# Patient Record
Sex: Female | Born: 1940 | Race: White | Hispanic: No | Smoking: Current every day smoker
Health system: Southern US, Community
[De-identification: ages and names within clinical notes are randomized; demographics above are authoritative.]

## PROBLEM LIST (undated history)

## (undated) DIAGNOSIS — K259 Gastric ulcer, unspecified as acute or chronic, without hemorrhage or perforation: Secondary | ICD-10-CM

## (undated) DIAGNOSIS — Z9289 Personal history of other medical treatment: Secondary | ICD-10-CM

## (undated) DIAGNOSIS — M1990 Unspecified osteoarthritis, unspecified site: Secondary | ICD-10-CM

## (undated) DIAGNOSIS — J45909 Unspecified asthma, uncomplicated: Secondary | ICD-10-CM

## (undated) DIAGNOSIS — Z8719 Personal history of other diseases of the digestive system: Secondary | ICD-10-CM

## (undated) DIAGNOSIS — I509 Heart failure, unspecified: Secondary | ICD-10-CM

## (undated) DIAGNOSIS — E119 Type 2 diabetes mellitus without complications: Secondary | ICD-10-CM

## (undated) DIAGNOSIS — F419 Anxiety disorder, unspecified: Secondary | ICD-10-CM

## (undated) DIAGNOSIS — I2699 Other pulmonary embolism without acute cor pulmonale: Secondary | ICD-10-CM

## (undated) DIAGNOSIS — C4331 Malignant melanoma of nose: Secondary | ICD-10-CM

## (undated) DIAGNOSIS — J449 Chronic obstructive pulmonary disease, unspecified: Secondary | ICD-10-CM

## (undated) DIAGNOSIS — Z9981 Dependence on supplemental oxygen: Secondary | ICD-10-CM

## (undated) DIAGNOSIS — G40409 Other generalized epilepsy and epileptic syndromes, not intractable, without status epilepticus: Secondary | ICD-10-CM

## (undated) DIAGNOSIS — M797 Fibromyalgia: Secondary | ICD-10-CM

## (undated) DIAGNOSIS — J42 Unspecified chronic bronchitis: Secondary | ICD-10-CM

## (undated) DIAGNOSIS — K219 Gastro-esophageal reflux disease without esophagitis: Secondary | ICD-10-CM

## (undated) DIAGNOSIS — F32A Depression, unspecified: Secondary | ICD-10-CM

## (undated) DIAGNOSIS — E7800 Pure hypercholesterolemia, unspecified: Secondary | ICD-10-CM

## (undated) DIAGNOSIS — F329 Major depressive disorder, single episode, unspecified: Secondary | ICD-10-CM

## (undated) HISTORY — PX: LAPAROSCOPIC CHOLECYSTECTOMY: SUR755

## (undated) HISTORY — PX: MELANOMA EXCISION: SHX5266

## (undated) HISTORY — PX: EXCISIONAL HEMORRHOIDECTOMY: SHX1541

## (undated) HISTORY — PX: ABDOMINAL HYSTERECTOMY: SHX81

## (undated) HISTORY — PX: TUBAL LIGATION: SHX77

## (undated) HISTORY — PX: CARPAL TUNNEL RELEASE: SHX101

## (undated) HISTORY — PX: CATARACT EXTRACTION W/ INTRAOCULAR LENS  IMPLANT, BILATERAL: SHX1307

---

## 2013-08-11 ENCOUNTER — Emergency Department (HOSPITAL_COMMUNITY)
Admission: EM | Admit: 2013-08-11 | Discharge: 2013-08-11 | Disposition: A | Discharge status: Home / Self Care | Payer: Medicare Other | Attending: Emergency Medicine | Admitting: Emergency Medicine

## 2013-08-11 ENCOUNTER — Emergency Department (HOSPITAL_COMMUNITY): Payer: Medicare Other

## 2013-08-11 ENCOUNTER — Encounter (HOSPITAL_COMMUNITY): Payer: Self-pay | Admitting: Emergency Medicine

## 2013-08-11 DIAGNOSIS — Y92838 Other recreation area as the place of occurrence of the external cause: Secondary | ICD-10-CM

## 2013-08-11 DIAGNOSIS — S8990XA Unspecified injury of unspecified lower leg, initial encounter: Secondary | ICD-10-CM | POA: Insufficient documentation

## 2013-08-11 DIAGNOSIS — Y9239 Other specified sports and athletic area as the place of occurrence of the external cause: Secondary | ICD-10-CM | POA: Insufficient documentation

## 2013-08-11 DIAGNOSIS — IMO0002 Reserved for concepts with insufficient information to code with codable children: Secondary | ICD-10-CM | POA: Insufficient documentation

## 2013-08-11 DIAGNOSIS — E119 Type 2 diabetes mellitus without complications: Secondary | ICD-10-CM | POA: Insufficient documentation

## 2013-08-11 DIAGNOSIS — F17200 Nicotine dependence, unspecified, uncomplicated: Secondary | ICD-10-CM | POA: Insufficient documentation

## 2013-08-11 DIAGNOSIS — G43909 Migraine, unspecified, not intractable, without status migrainosus: Secondary | ICD-10-CM | POA: Insufficient documentation

## 2013-08-11 DIAGNOSIS — J45901 Unspecified asthma with (acute) exacerbation: Secondary | ICD-10-CM

## 2013-08-11 DIAGNOSIS — J441 Chronic obstructive pulmonary disease with (acute) exacerbation: Secondary | ICD-10-CM | POA: Insufficient documentation

## 2013-08-11 DIAGNOSIS — S99919A Unspecified injury of unspecified ankle, initial encounter: Principal | ICD-10-CM

## 2013-08-11 DIAGNOSIS — Z8719 Personal history of other diseases of the digestive system: Secondary | ICD-10-CM | POA: Insufficient documentation

## 2013-08-11 DIAGNOSIS — R296 Repeated falls: Secondary | ICD-10-CM | POA: Insufficient documentation

## 2013-08-11 DIAGNOSIS — S8992XA Unspecified injury of left lower leg, initial encounter: Secondary | ICD-10-CM

## 2013-08-11 DIAGNOSIS — Z792 Long term (current) use of antibiotics: Secondary | ICD-10-CM | POA: Insufficient documentation

## 2013-08-11 DIAGNOSIS — Z79899 Other long term (current) drug therapy: Secondary | ICD-10-CM | POA: Insufficient documentation

## 2013-08-11 DIAGNOSIS — S99929A Unspecified injury of unspecified foot, initial encounter: Principal | ICD-10-CM

## 2013-08-11 DIAGNOSIS — Y9389 Activity, other specified: Secondary | ICD-10-CM | POA: Insufficient documentation

## 2013-08-11 HISTORY — DX: Gastric ulcer, unspecified as acute or chronic, without hemorrhage or perforation: K25.9

## 2013-08-11 HISTORY — DX: Unspecified asthma, uncomplicated: J45.909

## 2013-08-11 HISTORY — DX: Chronic obstructive pulmonary disease, unspecified: J44.9

## 2013-08-11 MED ORDER — HYDROCODONE-ACETAMINOPHEN 5-325 MG PO TABS: 1.0000 | ORAL_TABLET | Freq: Four times a day (QID) | ORAL | Status: DC | PRN

## 2013-08-11 NOTE — ED Provider Notes (Signed)
CSN: [id number]     Arrival date & time 08/11/13  1837 History  This chart was scribed for Jacqueline Sorrow, MD by Steva Colder, ED Scribe. The patient was seen in room [facility] at 8:45 PM.    Chief Complaint  Patient presents with  . Fall   Patient is a 73 y.o. female presenting with fall. The history is provided by the patient and a relative. No language interpreter was used.  Fall This is a new problem. The current episode started yesterday. The problem has not changed since onset.Associated symptoms include shortness of breath. Pertinent negatives include no chest pain, no abdominal pain and no headaches. The symptoms are aggravated by walking. The symptoms are relieved by rest. She has tried nothing for the symptoms. The treatment provided no relief.   HPI Comments: Elnore Cosens is a 73 y.o. female who presents to the Emergency Department complaining of a left knee injury. Pt states that she lost her balance and fell yesterday evening while gardening and she landed on her knees. Pt states that she is able to stand and is not able to walk far distances resulting from the pain. Pt states that her pain is worsened due to weight-bearing. Pt states that she has associated symptoms of pain, swelling, and bruising. Pt denies hip pain and any other associated symptoms. Pt states that she has fallen in a similar manner 7 days ago.   Past Medical History  Diagnosis Date  . Diabetes mellitus without complication   . Asthma   . COPD (chronic obstructive pulmonary disease)   . Seizures   . Gastric ulcer    Past Surgical History  Procedure Laterality Date  . Cholecystectomy    . Abdominal hysterectomy     No family history on file. History  Substance Use Topics  . Smoking status: Current Every Day Smoker -- 0.50 packs/day    Types: Cigarettes  . Smokeless tobacco: Not on file  . Alcohol Use: No   OB History   Grav Para Term Preterm Abortions TAB SAB Ect Mult Living                   Review of Systems  Constitutional: Negative for fever and chills.  HENT: Negative for congestion, rhinorrhea and sore throat.   Eyes: Negative for visual disturbance.  Respiratory: Positive for shortness of breath. Negative for cough.   Cardiovascular: Positive for leg swelling (right ankle). Negative for chest pain.  Gastrointestinal: Negative for nausea, vomiting, abdominal pain and diarrhea.  Genitourinary: Negative for dysuria.  Musculoskeletal: Positive for back pain. Negative for neck pain.  Skin: Positive for rash (right ankle).  Neurological: Negative for headaches.  Hematological: Does not bruise/bleed easily.  Psychiatric/Behavioral: Negative for confusion.      Allergies  Review of patient's allergies indicates no known allergies.  Home Medications   Prior to Admission medications   Medication Sig Start Date End Date Taking? Authorizing Provider  ADVAIR DISKUS 250-50 MCG/DOSE AEPB Inhale 1 puff into the lungs 2 (two) times daily. 08/08/13  Yes Historical Provider, MD  ALPRAZolam Duanne Moron) 0.5 MG tablet Take 0.5 mg by mouth 3 (three) times daily. 07/18/13  Yes Historical Provider, MD  cephALEXin (KEFLEX) 500 MG capsule Take 500 mg by mouth 2 (two) times daily. 08/09/13  Yes Historical Provider, MD  ciprofloxacin (CIPRO) 500 MG tablet Take 500 mg by mouth 2 (two) times daily. 08/06/13  Yes Historical Provider, MD  clopidogrel (PLAVIX) 75 MG tablet Take 75 mg by mouth  daily. 08/05/13  Yes Historical Provider, MD  CRESTOR 20 MG tablet Take 20 mg by mouth daily. 08/05/13  Yes Historical Provider, MD  DULoxetine (CYMBALTA) 60 MG capsule Take 60 mg by mouth 2 (two) times daily.   Yes Historical Provider, MD  fludrocortisone (FLORINEF) 0.1 MG tablet Take 0.1 mg by mouth daily. 07/15/13  Yes Historical Provider, MD  fluticasone (FLONASE) 50 MCG/ACT nasal spray Place 2 sprays into both nostrils daily. 06/12/13  Yes Historical Provider, MD  furosemide (LASIX) 40 MG tablet Take 40 mg by mouth  daily. 08/09/13  Yes Historical Provider, MD  HYDROcodone-acetaminophen (NORCO) 10-325 MG per tablet Take 1 tablet by mouth every 6 (six) hours as needed. pain 08/09/13  Yes Historical Provider, MD  HYDROcodone-acetaminophen (NORCO/VICODIN) 5-325 MG per tablet Take 1-2 tablets by mouth every 6 (six) hours as needed for moderate pain. 08/11/13   Jacqueline Sorrow, MD  isosorbide mononitrate (ISMO,MONOKET) 20 MG tablet Take 20 mg by mouth daily. 05/06/13  Yes Historical Provider, MD  LANTUS SOLOSTAR 100 UNIT/ML Solostar Pen Inject 60 Units into the skin at bedtime. 08/05/13  Yes Historical Provider, MD  losartan (COZAAR) 25 MG tablet Take 25 mg by mouth daily. 07/12/13  Yes Historical Provider, MD  meclizine (ANTIVERT) 12.5 MG tablet Take 12.5 mg by mouth 3 (three) times daily as needed. vertigo 06/12/13  Yes Historical Provider, MD  methadone (DOLOPHINE) 10 MG tablet Take 10 mg by mouth 2 (two) times daily. 08/11/13  Yes Historical Provider, MD  metoCLOPramide (REGLAN) 5 MG tablet Take 5 mg by mouth 4 (four) times daily -  before meals and at bedtime.  06/12/13  Yes Historical Provider, MD  metroNIDAZOLE (FLAGYL) 500 MG tablet Take 500 mg by mouth 2 (two) times daily. 08/06/13  Yes Historical Provider, MD  midodrine (PROAMATINE) 5 MG tablet Take 5 mg by mouth 3 (three) times daily. 06/12/13  Yes Historical Provider, MD  NEXIUM 40 MG capsule Take 40 mg by mouth daily. 08/09/13  Yes Historical Provider, MD  pregabalin (LYRICA) 150 MG capsule Take 150 mg by mouth 2 (two) times daily.   Yes Historical Provider, MD  PROAIR HFA 108 (90 BASE) MCG/ACT inhaler Inhale 2 puffs into the lungs every 6 (six) hours as needed. Shortness of breath 08/09/13  Yes Historical Provider, MD  zolpidem (AMBIEN) 10 MG tablet Take 10 mg by mouth at bedtime. 06/12/13  Yes Historical Provider, MD   BP 65/54  Pulse 94  Temp(Src) 98 F (36.7 C) (Oral)  Resp 18  Ht 5\' 3"  (1.6 m)  Wt 199 lb (90.266 kg)  BMI 35.26 kg/m2  SpO2 97%  Physical Exam  Nursing  note and vitals reviewed. Constitutional: She is oriented to person, place, and time. She appears well-developed and well-nourished. No distress.  HENT:  Head: Normocephalic and atraumatic.  Mouth/Throat: No oropharyngeal exudate.  Eyes: EOM are normal.  Neck: Neck supple. No tracheal deviation present.  Cardiovascular: Normal rate and regular rhythm.   No murmur heard. Pulmonary/Chest: Effort normal and breath sounds normal. No respiratory distress. She has no wheezes. She has no rales.  Abdominal: Soft. Bowel sounds are normal. There is no tenderness.  Musculoskeletal: Normal range of motion.  Swelling and 10 cm area of bruising to the medial-to-upper aspect to the knee.   Neurological: She is alert and oriented to person, place, and time. No cranial nerve deficit.  Skin: Skin is warm and dry. There is erythema.  Swelling and redness to the right foot and right  ankle. None on the left side.  Psychiatric: She has a normal mood and affect. Her behavior is normal.    ED Course  Procedures (including critical care time)  Medications - No data to display  DIAGNOSTIC STUDIES: Oxygen Saturation is 97% on room air, normal by my interpretation.    COORDINATION OF CARE: 8:50 PM-Discussed treatment plan which includes a CT scan with pt at bedside and pt agreed to plan.   No results found for this or any previous visit. Dg Knee Complete 4 Views Left  08/11/2013   CLINICAL DATA:  Left knee pain with bruising to the lateral side. [place] in the garden yesterday.  EXAM: LEFT KNEE - COMPLETE 4+ VIEW  COMPARISON:  None.  FINDINGS: Mild degenerative narrowing of the medial and lateral compartments of the left knee with minimal productive changes. Probable diffuse soft tissue edema around the knee. Prepatellar soft tissue swelling may indicate bursitis. No significant effusion. Hypertrophic changes on the patellar surface. No displaced fractures identified. Mild diffuse bone demineralization.  IMPRESSION:  Degenerative changes in the left knee with prepatellar soft tissue swelling suggesting possible bursitis. No displaced fractures identified.   Electronically Signed   By: Lucienne Capers M.D.   On: 08/11/2013 21:35     EKG Interpretation None      MDM   Final diagnoses:  Left knee injury    Patient with soft tissue injury to the knee no evidence of any bony injury. Will treat with knee immobilizer followup with orthopedics in the [place] area.    I personally performed the services described in this documentation, which was scribed in my presence. The recorded information has been reviewed and is accurate.     Jacqueline Sorrow, MD 08/11/13 2214

## 2013-08-11 NOTE — Discharge Instructions (Signed)
Soft tissue injury to your left knee. No evidence of any bony injury. Use knee immobilizer for comfort. Can take it off to shower. Followup with your primary care Dr. in the orthopedic Dr. in the [place] area in the next few days. Take pain medicine as needed. Return for any new or worse symptoms.

## 2013-08-11 NOTE — ED Notes (Signed)
Patient given ice pack for left knee swelling.

## 2013-08-11 NOTE — ED Notes (Signed)
[place] in the garden yesterday and landed on knees, c/o pain left knee

## 2014-09-07 ENCOUNTER — Inpatient Hospital Stay (HOSPITAL_COMMUNITY)
Admission: AD | Admit: 2014-09-07 | Discharge: 2014-09-12 | DRG: 389 | Disposition: A | Discharge status: Home / Self Care | Payer: Medicare Other | Source: Other Acute Inpatient Hospital | Attending: Internal Medicine | Admitting: Internal Medicine

## 2014-09-07 ENCOUNTER — Inpatient Hospital Stay (HOSPITAL_COMMUNITY): Payer: Medicare Other

## 2014-09-07 DIAGNOSIS — I10 Essential (primary) hypertension: Secondary | ICD-10-CM | POA: Diagnosis present

## 2014-09-07 DIAGNOSIS — J45909 Unspecified asthma, uncomplicated: Secondary | ICD-10-CM | POA: Diagnosis present

## 2014-09-07 DIAGNOSIS — G40909 Epilepsy, unspecified, not intractable, without status epilepticus: Secondary | ICD-10-CM | POA: Diagnosis present

## 2014-09-07 DIAGNOSIS — Z8711 Personal history of peptic ulcer disease: Secondary | ICD-10-CM

## 2014-09-07 DIAGNOSIS — Z90710 Acquired absence of both cervix and uterus: Secondary | ICD-10-CM | POA: Diagnosis not present

## 2014-09-07 DIAGNOSIS — Z7902 Long term (current) use of antithrombotics/antiplatelets: Secondary | ICD-10-CM | POA: Diagnosis not present

## 2014-09-07 DIAGNOSIS — I951 Orthostatic hypotension: Secondary | ICD-10-CM | POA: Diagnosis present

## 2014-09-07 DIAGNOSIS — Z7982 Long term (current) use of aspirin: Secondary | ICD-10-CM

## 2014-09-07 DIAGNOSIS — J449 Chronic obstructive pulmonary disease, unspecified: Secondary | ICD-10-CM

## 2014-09-07 DIAGNOSIS — F17210 Nicotine dependence, cigarettes, uncomplicated: Secondary | ICD-10-CM | POA: Diagnosis present

## 2014-09-07 DIAGNOSIS — D72829 Elevated white blood cell count, unspecified: Secondary | ICD-10-CM

## 2014-09-07 DIAGNOSIS — K224 Dyskinesia of esophagus: Secondary | ICD-10-CM | POA: Diagnosis present

## 2014-09-07 DIAGNOSIS — F419 Anxiety disorder, unspecified: Secondary | ICD-10-CM | POA: Diagnosis present

## 2014-09-07 DIAGNOSIS — E785 Hyperlipidemia, unspecified: Secondary | ICD-10-CM | POA: Diagnosis present

## 2014-09-07 DIAGNOSIS — I739 Peripheral vascular disease, unspecified: Secondary | ICD-10-CM | POA: Diagnosis not present

## 2014-09-07 DIAGNOSIS — D125 Benign neoplasm of sigmoid colon: Secondary | ICD-10-CM | POA: Diagnosis present

## 2014-09-07 DIAGNOSIS — Z9049 Acquired absence of other specified parts of digestive tract: Secondary | ICD-10-CM | POA: Diagnosis present

## 2014-09-07 DIAGNOSIS — R339 Retention of urine, unspecified: Secondary | ICD-10-CM | POA: Diagnosis not present

## 2014-09-07 DIAGNOSIS — K5730 Diverticulosis of large intestine without perforation or abscess without bleeding: Secondary | ICD-10-CM | POA: Diagnosis present

## 2014-09-07 DIAGNOSIS — K219 Gastro-esophageal reflux disease without esophagitis: Secondary | ICD-10-CM | POA: Diagnosis present

## 2014-09-07 DIAGNOSIS — K529 Noninfective gastroenteritis and colitis, unspecified: Secondary | ICD-10-CM

## 2014-09-07 DIAGNOSIS — K626 Ulcer of anus and rectum: Secondary | ICD-10-CM | POA: Diagnosis present

## 2014-09-07 DIAGNOSIS — K5641 Fecal impaction: Principal | ICD-10-CM | POA: Diagnosis present

## 2014-09-07 DIAGNOSIS — K922 Gastrointestinal hemorrhage, unspecified: Secondary | ICD-10-CM | POA: Diagnosis present

## 2014-09-07 DIAGNOSIS — K5901 Slow transit constipation: Secondary | ICD-10-CM | POA: Diagnosis not present

## 2014-09-07 DIAGNOSIS — R112 Nausea with vomiting, unspecified: Secondary | ICD-10-CM | POA: Diagnosis not present

## 2014-09-07 DIAGNOSIS — N39490 Overflow incontinence: Secondary | ICD-10-CM | POA: Diagnosis present

## 2014-09-07 DIAGNOSIS — J438 Other emphysema: Secondary | ICD-10-CM

## 2014-09-07 DIAGNOSIS — F329 Major depressive disorder, single episode, unspecified: Secondary | ICD-10-CM | POA: Diagnosis present

## 2014-09-07 DIAGNOSIS — R7881 Bacteremia: Secondary | ICD-10-CM | POA: Diagnosis not present

## 2014-09-07 DIAGNOSIS — K625 Hemorrhage of anus and rectum: Secondary | ICD-10-CM | POA: Diagnosis not present

## 2014-09-07 DIAGNOSIS — D62 Acute posthemorrhagic anemia: Secondary | ICD-10-CM | POA: Diagnosis not present

## 2014-09-07 DIAGNOSIS — I370 Nonrheumatic pulmonary valve stenosis: Secondary | ICD-10-CM | POA: Diagnosis not present

## 2014-09-07 DIAGNOSIS — J42 Unspecified chronic bronchitis: Secondary | ICD-10-CM | POA: Diagnosis not present

## 2014-09-07 DIAGNOSIS — E119 Type 2 diabetes mellitus without complications: Secondary | ICD-10-CM

## 2014-09-07 HISTORY — DX: Major depressive disorder, single episode, unspecified: F32.9

## 2014-09-07 HISTORY — DX: Gastro-esophageal reflux disease without esophagitis: K21.9

## 2014-09-07 HISTORY — DX: Heart failure, unspecified: I50.9

## 2014-09-07 HISTORY — DX: Depression, unspecified: F32.A

## 2014-09-07 HISTORY — DX: Malignant melanoma of nose: C43.31

## 2014-09-07 HISTORY — DX: Other pulmonary embolism without acute cor pulmonale: I26.99

## 2014-09-07 HISTORY — DX: Anxiety disorder, unspecified: F41.9

## 2014-09-07 HISTORY — DX: Personal history of other medical treatment: Z92.89

## 2014-09-07 HISTORY — DX: Unspecified chronic bronchitis: J42

## 2014-09-07 HISTORY — DX: Fibromyalgia: M79.7

## 2014-09-07 HISTORY — DX: Personal history of other diseases of the digestive system: Z87.19

## 2014-09-07 HISTORY — DX: Type 2 diabetes mellitus without complications: E11.9

## 2014-09-07 HISTORY — DX: Unspecified osteoarthritis, unspecified site: M19.90

## 2014-09-07 HISTORY — DX: Dependence on supplemental oxygen: Z99.81

## 2014-09-07 HISTORY — DX: Other generalized epilepsy and epileptic syndromes, not intractable, without status epilepticus: G40.409

## 2014-09-07 HISTORY — DX: Pure hypercholesterolemia, unspecified: E78.00

## 2014-09-07 LAB — GLUCOSE, CAPILLARY
GLUCOSE-CAPILLARY: 163 mg/dL — AB (ref 65–99)
Glucose-Capillary: 225 mg/dL — ABNORMAL HIGH (ref 65–99)
Glucose-Capillary: 216 mg/dL — ABNORMAL HIGH (ref 65–99)
Glucose-Capillary: 187 mg/dL — ABNORMAL HIGH (ref 65–99)

## 2014-09-07 LAB — COMPREHENSIVE METABOLIC PANEL
ALK PHOS: 69 U/L (ref 38–126)
ALT: 10 U/L — AB (ref 14–54)
AST: 18 U/L (ref 15–41)
Albumin: 2.7 g/dL — ABNORMAL LOW (ref 3.5–5.0)
Anion gap: 10 (ref 5–15)
BUN: 8 mg/dL (ref 6–20)
CO2: 21 mmol/L — AB (ref 22–32)
CREATININE: 0.86 mg/dL (ref 0.44–1.00)
Calcium: 8.3 mg/dL — ABNORMAL LOW (ref 8.9–10.3)
Chloride: 108 mmol/L (ref 101–111)
GFR calc Af Amer: >60 mL/min (ref >60)
Glucose, Bld: 222 mg/dL — ABNORMAL HIGH (ref 65–99)
POTASSIUM: 3.5 mmol/L (ref 3.5–5.1)
Sodium: 139 mmol/L (ref 135–145)
Total Bilirubin: 0.5 mg/dL (ref 0.3–1.2)
Total Protein: 5.6 g/dL — ABNORMAL LOW (ref 6.5–8.1)

## 2014-09-07 LAB — HEMOGLOBIN AND HEMATOCRIT, BLOOD
HCT: 27.5 % — ABNORMAL LOW (ref 36.0–46.0)
HEMATOCRIT: 27.8 % — AB (ref 36.0–46.0)
HEMOGLOBIN: 9.2 g/dL — AB (ref 12.0–15.0)
Hemoglobin: 8.9 g/dL — ABNORMAL LOW (ref 12.0–15.0)

## 2014-09-07 LAB — CBC
HCT: 30.5 % — ABNORMAL LOW (ref 36.0–46.0)
HEMOGLOBIN: 9.9 g/dL — AB (ref 12.0–15.0)
MCH: 26.8 pg (ref 26.0–34.0)
MCHC: 32.5 g/dL (ref 30.0–36.0)
MCV: 82.7 fL (ref 78.0–100.0)
Platelets: 267 10*3/uL (ref 150–400)
RBC: 3.69 MIL/uL — AB (ref 3.87–5.11)
RDW: 13.7 % (ref 11.5–15.5)
WBC: 29.2 10*3/uL — ABNORMAL HIGH (ref 4.0–10.5)

## 2014-09-07 LAB — ABO/RH: ABO/RH(D): O NEG

## 2014-09-07 LAB — OCCULT BLOOD X 1 CARD TO LAB, STOOL: Fecal Occult Bld: POSITIVE — AB

## 2014-09-07 LAB — TYPE AND SCREEN
ABO/RH(D): O NEG
Antibody Screen: NEGATIVE

## 2014-09-07 LAB — PROTIME-INR
INR: 1.24 (ref 0.00–1.49)
Prothrombin Time: 15.8 seconds — ABNORMAL HIGH (ref 11.6–15.2)

## 2014-09-07 MED ORDER — FLUDROCORTISONE ACETATE 0.1 MG PO TABS
0.1000 mg | ORAL_TABLET | Freq: Every day | ORAL | Status: DC
  Administered 2014-09-07 – 2014-09-12 (×6): 0.1 mg via ORAL
  Filled 2014-09-07 (×6): qty 1

## 2014-09-07 MED ORDER — TOPIRAMATE 25 MG PO TABS
50.0000 mg | ORAL_TABLET | Freq: Two times a day (BID) | ORAL | Status: DC
  Administered 2014-09-07 – 2014-09-12 (×11): 50 mg via ORAL
  Filled 2014-09-07 (×13): qty 2

## 2014-09-07 MED ORDER — INSULIN GLARGINE 100 UNIT/ML SOLOSTAR PEN: 40.0000 [IU] | PEN_INJECTOR | Freq: Every day | SUBCUTANEOUS | Status: DC

## 2014-09-07 MED ORDER — SODIUM CHLORIDE 0.9 % IV SOLN
INTRAVENOUS | Status: DC
  Administered 2014-09-07 – 2014-09-09 (×4): via INTRAVENOUS

## 2014-09-07 MED ORDER — SODIUM CHLORIDE 0.9 % IJ SOLN
3.0000 mL | Freq: Two times a day (BID) | INTRAMUSCULAR | Status: DC
Start: 2014-09-07 — End: 2014-09-12
  Administered 2014-09-07 – 2014-09-12 (×8): 3 mL via INTRAVENOUS

## 2014-09-07 MED ORDER — ACETAMINOPHEN 650 MG RE SUPP: 650.0000 mg | Freq: Four times a day (QID) | RECTAL | Status: DC | PRN

## 2014-09-07 MED ORDER — MECLIZINE HCL 12.5 MG PO TABS
12.5000 mg | ORAL_TABLET | Freq: Three times a day (TID) | ORAL | Status: DC | PRN
  Filled 2014-09-07: qty 1

## 2014-09-07 MED ORDER — ALPRAZOLAM 0.5 MG PO TABS
0.5000 mg | ORAL_TABLET | Freq: Three times a day (TID) | ORAL | Status: DC
  Administered 2014-09-07 – 2014-09-12 (×10): 0.5 mg via ORAL
  Filled 2014-09-07 (×11): qty 1

## 2014-09-07 MED ORDER — LEVOFLOXACIN IN D5W 750 MG/150ML IV SOLN
750.0000 mg | INTRAVENOUS | Status: AC
  Administered 2014-09-07: 750 mg via INTRAVENOUS
  Filled 2014-09-07: qty 150

## 2014-09-07 MED ORDER — INSULIN ASPART 100 UNIT/ML ~~LOC~~ SOLN
0.0000 [IU] | Freq: Three times a day (TID) | SUBCUTANEOUS | Status: DC
  Administered 2014-09-07: 2 [IU] via SUBCUTANEOUS
  Administered 2014-09-07: 3 [IU] via SUBCUTANEOUS
  Administered 2014-09-07: 1 [IU] via SUBCUTANEOUS
  Administered 2014-09-08 (×2): 2 [IU] via SUBCUTANEOUS
  Administered 2014-09-10 (×3): 1 [IU] via SUBCUTANEOUS
  Administered 2014-09-11: 2 [IU] via SUBCUTANEOUS
  Administered 2014-09-12: 1 [IU] via SUBCUTANEOUS

## 2014-09-07 MED ORDER — PANTOPRAZOLE SODIUM 40 MG IV SOLR: 40.0000 mg | Freq: Two times a day (BID) | INTRAVENOUS | Status: DC

## 2014-09-07 MED ORDER — SODIUM CHLORIDE 0.9 % IV SOLN
80.0000 mg | Freq: Once | INTRAVENOUS | Status: AC
  Administered 2014-09-07: 80 mg via INTRAVENOUS
  Filled 2014-09-07: qty 80

## 2014-09-07 MED ORDER — POTASSIUM CHLORIDE CRYS ER 20 MEQ PO TBCR
20.0000 meq | EXTENDED_RELEASE_TABLET | Freq: Every day | ORAL | Status: DC
  Administered 2014-09-07 – 2014-09-12 (×6): 20 meq via ORAL
  Filled 2014-09-07 (×5): qty 1

## 2014-09-07 MED ORDER — DULOXETINE HCL 60 MG PO CPEP
60.0000 mg | ORAL_CAPSULE | Freq: Two times a day (BID) | ORAL | Status: DC
  Administered 2014-09-07 – 2014-09-12 (×9): 60 mg via ORAL
  Filled 2014-09-07 (×13): qty 1

## 2014-09-07 MED ORDER — MOMETASONE FURO-FORMOTEROL FUM 100-5 MCG/ACT IN AERO
2.0000 | INHALATION_SPRAY | Freq: Two times a day (BID) | RESPIRATORY_TRACT | Status: DC
  Administered 2014-09-07 – 2014-09-12 (×9): 2 via RESPIRATORY_TRACT
  Filled 2014-09-07 (×2): qty 8.8

## 2014-09-07 MED ORDER — METRONIDAZOLE IN NACL 5-0.79 MG/ML-% IV SOLN
500.0000 mg | Freq: Three times a day (TID) | INTRAVENOUS | Status: DC
  Administered 2014-09-07 – 2014-09-08 (×4): 500 mg via INTRAVENOUS
  Filled 2014-09-07 (×6): qty 100

## 2014-09-07 MED ORDER — POLYETHYLENE GLYCOL 3350 17 G PO PACK
17.0000 g | PACK | Freq: Two times a day (BID) | ORAL | Status: DC
  Administered 2014-09-07 (×2): 17 g via ORAL
  Filled 2014-09-07 (×4): qty 1

## 2014-09-07 MED ORDER — ROSUVASTATIN CALCIUM 40 MG PO TABS
40.0000 mg | ORAL_TABLET | Freq: Every day | ORAL | Status: DC
  Administered 2014-09-07 – 2014-09-12 (×6): 40 mg via ORAL
  Filled 2014-09-07 (×6): qty 1

## 2014-09-07 MED ORDER — ONDANSETRON HCL 4 MG PO TABS: 4.0000 mg | ORAL_TABLET | Freq: Four times a day (QID) | ORAL | Status: DC | PRN

## 2014-09-07 MED ORDER — BISACODYL 5 MG PO TBEC: 5.0000 mg | DELAYED_RELEASE_TABLET | Freq: Every day | ORAL | Status: DC | PRN

## 2014-09-07 MED ORDER — ACETAMINOPHEN 325 MG PO TABS: 650.0000 mg | ORAL_TABLET | Freq: Four times a day (QID) | ORAL | Status: DC | PRN

## 2014-09-07 MED ORDER — ONDANSETRON HCL 4 MG/2ML IJ SOLN: 4.0000 mg | Freq: Four times a day (QID) | INTRAMUSCULAR | Status: DC | PRN

## 2014-09-07 MED ORDER — SODIUM CHLORIDE 0.9 % IV SOLN
8.0000 mg/h | INTRAVENOUS | Status: DC
  Administered 2014-09-07 – 2014-09-08 (×3): 8 mg/h via INTRAVENOUS
  Filled 2014-09-07 (×9): qty 80

## 2014-09-07 MED ORDER — ALBUTEROL SULFATE (2.5 MG/3ML) 0.083% IN NEBU: 2.5000 mg | INHALATION_SOLUTION | RESPIRATORY_TRACT | Status: DC | PRN

## 2014-09-07 MED ORDER — LEVOFLOXACIN IN D5W 750 MG/150ML IV SOLN
750.0000 mg | Freq: Once | INTRAVENOUS | Status: DC
  Filled 2014-09-07: qty 150

## 2014-09-07 MED ORDER — MIDODRINE HCL 5 MG PO TABS
5.0000 mg | ORAL_TABLET | Freq: Three times a day (TID) | ORAL | Status: DC
  Administered 2014-09-07 – 2014-09-12 (×14): 5 mg via ORAL
  Filled 2014-09-07 (×18): qty 1

## 2014-09-07 MED ORDER — MIDODRINE HCL 5 MG PO TABS
5.0000 mg | ORAL_TABLET | Freq: Three times a day (TID) | ORAL | Status: DC
  Filled 2014-09-07 (×2): qty 1

## 2014-09-07 MED ORDER — INSULIN GLARGINE 100 UNIT/ML ~~LOC~~ SOLN
40.0000 [IU] | Freq: Every day | SUBCUTANEOUS | Status: DC
  Administered 2014-09-07 – 2014-09-11 (×2): 40 [IU] via SUBCUTANEOUS
  Filled 2014-09-07 (×6): qty 0.4

## 2014-09-07 MED ORDER — LIDOCAINE HCL 2 % EX GEL
1.0000 "application " | Freq: Once | CUTANEOUS | Status: AC
  Administered 2014-09-07: 1 via TOPICAL
  Filled 2014-09-07: qty 5

## 2014-09-07 MED ORDER — IPRATROPIUM-ALBUTEROL 0.5-2.5 (3) MG/3ML IN SOLN
3.0000 mL | Freq: Four times a day (QID) | RESPIRATORY_TRACT | Status: DC
  Administered 2014-09-07 – 2014-09-09 (×7): 3 mL via RESPIRATORY_TRACT
  Filled 2014-09-07 (×7): qty 3

## 2014-09-07 MED ORDER — BISACODYL 5 MG PO TBEC
10.0000 mg | DELAYED_RELEASE_TABLET | Freq: Every day | ORAL | Status: DC
  Administered 2014-09-07 – 2014-09-08 (×2): 10 mg via ORAL
  Filled 2014-09-07: qty 2

## 2014-09-07 MED ORDER — OXYCODONE HCL 5 MG PO TABS
5.0000 mg | ORAL_TABLET | ORAL | Status: DC | PRN
  Administered 2014-09-07 – 2014-09-08 (×2): 5 mg via ORAL
  Filled 2014-09-07 (×2): qty 1

## 2014-09-07 NOTE — Progress Notes (Signed)
ANTIBIOTIC CONSULT NOTE - INITIAL  Pharmacy Consult for Levaquin Indication: intra-abd infection  No Known Allergies  Patient Measurements: Height: 5\' 3"  (160 cm) Weight: 201 lb (91.173 kg) IBW/kg (Calculated) : 52.4  Vital Signs: Temp: 97.9 F (36.6 C) (07/03 0650) Temp Source: Oral (07/03 0650) BP: 111/87 mmHg (07/03 0650) Pulse Rate: 99 (07/03 0650) Intake/Output from previous day:   Intake/Output from this shift:    Labs: No results for input(s): WBC, HGB, PLT, LABCREA, CREATININE in the last 72 hours. CrCl cannot be calculated (Patient has no serum creatinine result on file.). No results for input(s): VANCOTROUGH, VANCOPEAK, VANCORANDOM, GENTTROUGH, GENTPEAK, GENTRANDOM, TOBRATROUGH, TOBRAPEAK, TOBRARND, AMIKACINPEAK, AMIKACINTROU, AMIKACIN in the last 72 hours.   Microbiology: No results found for this or any previous visit (from the past 720 hour(s)).  Medical History: Past Medical History  Diagnosis Date  . Diabetes mellitus without complication   . Asthma   . COPD (chronic obstructive pulmonary disease)   . Seizures   . Gastric ulcer     Medications:  Awaiting electronic med rec  Assessment: 74 y.o. female to begin Levaquin for intra-abd infection. No MD notes yet. Afeb. Also starting Flagyl.  Goal of Therapy:  Resolution of infection  Plan:  Levaquin 750mg  IV now Will f/u renal function for further dosing.  Sherlon Handing, PharmD, BCPS Clinical pharmacist, pager [phone] 09/07/2014,7:20 AM

## 2014-09-07 NOTE — Significant Event (Signed)
Foley cath inserted # 16 fr with return of 300 ml of amber urine . Patient tolerated procedure well.

## 2014-09-07 NOTE — Consult Note (Signed)
Reason for Consult: Heme positive stool, anemia, and abdominal pain Referring Physician: [facility]  [facility] HPI: [name] with a PMH of DM, astham, COPD, seizures, and a gastric ulcer transferred from [facility] for the possibility of a GI bleed.  She started to have issues with abdominal pain shortly after Father's Day 2016.  Over the interval time period her symptoms have progressively worsened.  She presented to [facility] and a CT scan of the abdomen was performed.  Reportedly it states that she has a significant amount of retained stool and some inflammation in the rectosigmoid region.  There was mention of the possibility of a stercoral ulcer.  She has a long history of constipation and she typically responds to laxatives.  In the ER she was noted to be heme positive and of late she states that she has had black runny stools.  Her HGB is at 10, but her baseline is unknown.  [facility] is her GI in [facility], and per her report, she has undergone multiple EGDs with him for nausea and vomiting.  She states that she never knows the results of the EGD.  In the remote past she had a colonoscopy, but she cannot recall the results.  No use of Peptobismol.  Past Medical History  Diagnosis Date  . Diabetes mellitus without complication   . Asthma   . COPD (chronic obstructive pulmonary disease)   . Seizures   . Gastric ulcer     Past Surgical History  Procedure Laterality Date  . Cholecystectomy    . Abdominal hysterectomy      No family history on file.  Social History:  reports that she has been smoking Cigarettes.  She has been smoking about 0.50 packs per day. She does not have any smokeless tobacco history on file. She reports that she does not drink alcohol or use illicit drugs.  Allergies: No Known Allergies  Medications:  Scheduled: . insulin aspart  0-9 Units Subcutaneous TID WC  . ipratropium-albuterol  3 mL Nebulization Q6H  .  levofloxacin (LEVAQUIN) IV  750 mg Intravenous Once  . metronidazole  500 mg Intravenous 3 times per day  . mometasone-formoterol  2 puff Inhalation BID  . pantoprazole (PROTONIX) IV  80 mg Intravenous Once  . [START ON 09/10/2014] pantoprazole (PROTONIX) IV  40 mg Intravenous Q12H  . sodium chloride  3 mL Intravenous Q12H   Continuous: . sodium chloride    . pantoprozole (PROTONIX) infusion      Results for orders placed or performed during the hospital encounter of 09/07/14 (from the past 24 hour(s))  Glucose, capillary     Status: Abnormal   Collection Time: 09/07/14  7:11 AM  Result Value Ref Range   Glucose-Capillary 225 (H) 65 - 99 mg/dL     No results found.  ROS:  As stated above in the HPI otherwise negative.  Blood pressure 111/87, pulse 99, temperature 97.9 F (36.6 C), temperature source Oral, resp. rate 24, height 5\' 3"  (1.6 m), weight 91.173 kg (201 lb), SpO2 100 %.    PE: Gen: NAD, Alert and Oriented HEENT:  [place]/AT, EOMI Neck: Supple, no LAD Lungs: CTA Bilaterally CV: RRR without M/G/R ABM: Soft, NTND, +BS Ext: No C/C/E Rectal: Severe fecal impaction with overflow incontinence.  Assessment/Plan: 1) Fecal impaction. 2) Chronic constipation with overflow incontinence. 3) ABM pain.  4) Anemia. 5) Heme positive stool.   The rectal examination revealed a large hard  stool bolus just proximal to the anus.  This is consistent with the report of the stercoral ulcer on the CT scan.  She states that her abdominal pain typically subsides with a good bowel movement.  I think the heme positive stool is as a result of anal irritation.  I doubt she has a significant GI bleed, but she is anemic.  It is unfortunate that I do not know her baseline HGB.  Plan: 1) Disimpaction.  Nursing has kindly offered to perform this task.  I ordered viscous 2% lidocaine at the anus to help her tolerate the disimpaction.  She was very tender with my minimal rectal examination. 2) Miralax 17  g QD. 3) Follow HGB.  If her HGB is stable and she improves after her disimpaction, she can follow up with [facility] in [place]. [facility] D 09/07/2014, 8:57 AM

## 2014-09-07 NOTE — H&P (Signed)
Patient Demographics  Jacqueline Hansen, is a 74 y.o. female  MRN: [id number]   DOB - [birth year]  Admit Date - 09/07/2014  Outpatient Primary MD for the patient is Keane Police, MD   With History of -  Past Medical History  Diagnosis Date  . Diabetes mellitus without complication   . Asthma   . COPD (chronic obstructive pulmonary disease)   . Seizures   . Gastric ulcer       Past Surgical History  Procedure Laterality Date  . Cholecystectomy    . Abdominal hysterectomy      in for   No chief complaint on file.    HPI  Jacqueline Hansen  is a 74 y.o. female, medical history of COPD, diabetes mellitus, seizures, PAD, on aspirin and Plavix, resents to that hospital complaining of urinary retention, and dark color stools, and [place] workup was significant for hemoglobin of 10(unclear what is her baseline), was no significant labs abnormalities, shunt was found to have urinary retention, where she required in and out in [place] in ED, due to lack of GI coverage this weekend at [facility], patient was transferred to [facility], patient report is been having dark-colored stool for the last 2 days, denies any coffee-ground emesis, any bright red blood per rectum, a shunt was started on Protonix drip, and reports she had endoscopy many years ago, can't even remember when, reports she does not recall any abnormal findings, there is a history of gastric ulcer . CT abdomen and pelvis done on [facility] hospital was significant for rectosigmoid colitis , started on levofloxacin and Flagyl.   Review of Systems    In addition to the HPI above, No Fever-chills, No Headache, No changes with Vision or hearing, No problems swallowing food or Liquids, No Chest pain, Cough or Shortness of Breath, Complains of Abdominal pain, No Nausea or Vommitting, reports constipation . Reports dark color stools. No dysuria, No new skin rashes or bruises, No new joints pains-aches,  No new weakness,  tingling, numbness in any extremity, No recent weight gain or loss, No polyuria, polydypsia or polyphagia, No significant Mental Stressors.  A full 10 point Review of Systems was done, except as stated above, all other Review of Systems were negative.   Social History History  Substance Use Topics  . Smoking status: Current Every Day Smoker -- 0.50 packs/day    Types: Cigarettes  . Smokeless tobacco: Not on file  . Alcohol Use: No     Family History No family history on file. Simethicone for hypertension  Prior to Admission medications   Medication Sig Start Date End Date Taking? Authorizing Provider  ADVAIR DISKUS 250-50 MCG/DOSE AEPB Inhale 1 puff into the lungs 2 (two) times daily. 08/08/13   Historical Provider, MD  ALPRAZolam Duanne Moron) 0.5 MG tablet Take 0.5 mg by mouth 3 (three) times daily. 07/18/13   Historical Provider, MD  cephALEXin (KEFLEX) 500 MG capsule Take 500 mg by mouth 2 (two) times daily. 08/09/13   Historical Provider, MD  ciprofloxacin (CIPRO) 500 MG tablet Take 500 mg by mouth 2 (two) times daily. 08/06/13   Historical Provider, MD  clopidogrel (PLAVIX) 75 MG tablet Take 75 mg by mouth daily. 08/05/13   Historical Provider, MD  CRESTOR 20 MG tablet Take 20 mg by mouth daily. 08/05/13   Historical Provider, MD  DULoxetine (CYMBALTA) 60 MG capsule Take 60 mg by mouth 2 (two) times daily.    Historical Provider, MD  fludrocortisone (FLORINEF) 0.1  MG tablet Take 0.1 mg by mouth daily. 07/15/13   Historical Provider, MD  fluticasone (FLONASE) 50 MCG/ACT nasal spray Place 2 sprays into both nostrils daily. 06/12/13   Historical Provider, MD  furosemide (LASIX) 40 MG tablet Take 40 mg by mouth daily. 08/09/13   Historical Provider, MD  HYDROcodone-acetaminophen (NORCO) 10-325 MG per tablet Take 1 tablet by mouth every 6 (six) hours as needed. pain 08/09/13   Historical Provider, MD  HYDROcodone-acetaminophen (NORCO/VICODIN) 5-325 MG per tablet Take 1-2 tablets by mouth every 6 (six)  hours as needed for moderate pain. 08/11/13   Fredia Sorrow, MD  isosorbide mononitrate (ISMO,MONOKET) 20 MG tablet Take 20 mg by mouth daily. 05/06/13   Historical Provider, MD  LANTUS SOLOSTAR 100 UNIT/ML Solostar Pen Inject 60 Units into the skin at bedtime. 08/05/13   Historical Provider, MD  losartan (COZAAR) 25 MG tablet Take 25 mg by mouth daily. 07/12/13   Historical Provider, MD  meclizine (ANTIVERT) 12.5 MG tablet Take 12.5 mg by mouth 3 (three) times daily as needed. vertigo 06/12/13   Historical Provider, MD  methadone (DOLOPHINE) 10 MG tablet Take 10 mg by mouth 2 (two) times daily. 08/11/13   Historical Provider, MD  metoCLOPramide (REGLAN) 5 MG tablet Take 5 mg by mouth 4 (four) times daily -  before meals and at bedtime.  06/12/13   Historical Provider, MD  metroNIDAZOLE (FLAGYL) 500 MG tablet Take 500 mg by mouth 2 (two) times daily. 08/06/13   Historical Provider, MD  midodrine (PROAMATINE) 5 MG tablet Take 5 mg by mouth 3 (three) times daily. 06/12/13   Historical Provider, MD  NEXIUM 40 MG capsule Take 40 mg by mouth daily. 08/09/13   Historical Provider, MD  pregabalin (LYRICA) 150 MG capsule Take 150 mg by mouth 2 (two) times daily.    Historical Provider, MD  PROAIR HFA 108 (90 BASE) MCG/ACT inhaler Inhale 2 puffs into the lungs every 6 (six) hours as needed. Shortness of breath 08/09/13   Historical Provider, MD  zolpidem (AMBIEN) 10 MG tablet Take 10 mg by mouth at bedtime. 06/12/13   Historical Provider, MD    No Known Allergies  Physical Exam  Vitals  Blood pressure 111/87, pulse 99, temperature 97.9 F (36.6 C), temperature source Oral, resp. rate 24, height 5\' 3"  (1.6 m), weight 91.173 kg (201 lb), SpO2 100 %.   1. General : Female lying in bed in NAD,    2. Normal affect and insight, Not Suicidal or Homicidal, Awake Alert, Oriented X 3.  3. No F.N deficits, ALL C.Nerves Intact, Strength 5/5 all 4 extremities, Sensation intact all 4 extremities, Plantars down going.  4. Ears and  Eyes appear Normal, Conjunctivae clear, PERRLA. Moist Oral Mucosa.  5. Supple Neck, No JVD, No cervical lymphadenopathy appriciated, No Carotid Bruits.  6. Symmetrical Chest wall movement, Good air movement bilaterally,   7. RRR, No Gallops, Rubs or Murmurs, No Parasternal Heave.  8. Positive Bowel Sounds, Abdomen Soft, minimal tenderness to palpation in left abdomen, No organomegaly appriciated,No rebound -guarding or rigidity.  9.  No Cyanosis, Normal Skin Turgor, No Skin Rash or Bruise.  10. Good muscle tone,  joints appear normal , no effusions, Normal ROM.  11. No Palpable Lymph Nodes in Neck or Axillae  Data Review  CBC No results for input(s): WBC, HGB, HCT, PLT, MCV, MCH, MCHC, RDW, LYMPHSABS, MONOABS, EOSABS, BASOSABS, BANDABS in the last 168 hours.  Invalid input(s): NEUTRABS, BANDSABD ------------------------------------------------------------------------------------------------------------------  Chemistries  No results  for input(s): NA, K, CL, CO2, GLUCOSE, BUN, CREATININE, CALCIUM, MG, AST, ALT, ALKPHOS, BILITOT in the last 168 hours.  Invalid input(s): GFRCGP ------------------------------------------------------------------------------------------------------------------ CrCl cannot be calculated (Patient has no serum creatinine result on file.). ------------------------------------------------------------------------------------------------------------------ No results for input(s): TSH, T4TOTAL, T3FREE, THYROIDAB in the last 72 hours.  Invalid input(s): FREET3   Coagulation profile No results for input(s): INR, PROTIME in the last 168 hours. ------------------------------------------------------------------------------------------------------------------- No results for input(s): DDIMER in the last 72 hours. -------------------------------------------------------------------------------------------------------------------  Cardiac Enzymes No results for  input(s): CKMB, TROPONINI, MYOGLOBIN in the last 168 hours.  Invalid input(s): CK ------------------------------------------------------------------------------------------------------------------ Invalid input(s): POCBNP   ---------------------------------------------------------------------------------------------------------------  Urinalysis No results found for: COLORURINE, APPEARANCEUR, LABSPEC, [place], GLUCOSEU, HGBUR, BILIRUBINUR, KETONESUR, PROTEINUR, UROBILINOGEN, NITRITE, LEUKOCYTESUR  ----------------------------------------------------------------------------------------------------------------  Imaging results:   No results found.      Assessment & Plan  Active Problems:   GI bleed   Colitis   COPD (chronic obstructive pulmonary disease)   DM (diabetes mellitus)   PAD (peripheral artery disease)   Urinary retention   GI bleed - Patient presents with melena to [facility], hemoglobin of 10.1, unclear what is her baseline hemoglobin, patient had small bowel movement here, dose not look very tarry, will keep patient on clear liquid diet, start on Protonix drip with history of gastric ulcer, monitor hemoglobin every 8 hours, check type and screen and transfuse as needed, consult GI Dr. Benson Norway who will see the patient today. - Continue to hold aspirin and Plavix  Colitis - CT abdomen showing evidence of rectosigmoid colitis, possible stecoral, will continue with IV Flagyl and levofloxacin, will await GI evaluation, will need laxity of regimen.  COPD - No active wheezing, continue with nebs.  Urinary retention - Inserted Foley catheter.  Diabetes mellitus - Will start patient on insulin sliding scale, unclear what is her home medication and this point, pending pharmacy reconciliation. Will readdress the issue wants medication known.  PAD - Continue to hold aspirin and Plavix given possible GI bleed  Hypertension, hyperlipidemia - Await pharmacy  medication reconciliation, readdress the issue once medication known.    DVT Prophylaxis  SCDs(no chemical anticoagulation secondary to GI bleed)  AM Labs Ordered, also please review Full Orders  Family Communication: Admission, patients condition and plan of care including tests being ordered have been discussed with the patient  who indicate understanding and agree with the plan and Code Status.  Code Status Full  Likely DC to  home  Condition GUARDED    Time spent in minutes : 60 minutes   Tricia Pledger M.D on 09/07/2014 at 7:54 AM  Between 7am to 7pm - Pager - [phone]  After 7pm go to [email or web address] - password [email or web address]  And look for the night coverage person covering me after hours  [facility] Office  [phone]

## 2014-09-07 NOTE — Significant Event (Signed)
Patient disimpacted digitally of moderate amt of dark brown to gray color stool , soap suds enema used to complete disimpaction stool  too high to reach digitally with very large amt dark brown stool remove per enema. Patient tolerated procedure well , stated feels so much better now. Will continue to monitor patient.

## 2014-09-07 NOTE — Progress Notes (Signed)
Patient with fecal impaction, manually disimpacted, patient reports resolution of abdominal pain. - Leukocytosis of 29,000, most likely reactive, afebrile, recheck in a.m., urinalysis is negative at [facility], will check blood cultures, and chest x-ray. - Resume on Topamax for history of seizures. - Blood pressure on the lower side hold lisinopril. - Resume on midodrine and Florinef as on home medication list. Phillips Climes MD.

## 2014-09-08 ENCOUNTER — Encounter (HOSPITAL_COMMUNITY): Payer: Self-pay | Admitting: General Practice

## 2014-09-08 DIAGNOSIS — J42 Unspecified chronic bronchitis: Secondary | ICD-10-CM

## 2014-09-08 LAB — HEMOGLOBIN AND HEMATOCRIT, BLOOD
HCT: 25 % — ABNORMAL LOW (ref 36.0–46.0)
Hemoglobin: 8.1 g/dL — ABNORMAL LOW (ref 12.0–15.0)

## 2014-09-08 LAB — GLUCOSE, CAPILLARY
GLUCOSE-CAPILLARY: 108 mg/dL — AB (ref 65–99)
GLUCOSE-CAPILLARY: 158 mg/dL — AB (ref 65–99)
GLUCOSE-CAPILLARY: 89 mg/dL (ref 65–99)
Glucose-Capillary: 153 mg/dL — ABNORMAL HIGH (ref 65–99)

## 2014-09-08 LAB — BASIC METABOLIC PANEL
Anion gap: 6 (ref 5–15)
BUN: 8 mg/dL (ref 6–20)
CO2: 22 mmol/L (ref 22–32)
Calcium: 7.9 mg/dL — ABNORMAL LOW (ref 8.9–10.3)
Chloride: 112 mmol/L — ABNORMAL HIGH (ref 101–111)
Creatinine, Ser: 0.73 mg/dL (ref 0.44–1.00)
GFR calc Af Amer: >60 mL/min (ref >60)
GFR calc non Af Amer: >60 mL/min (ref >60)
Glucose, Bld: 123 mg/dL — ABNORMAL HIGH (ref 65–99)
Potassium: 3.4 mmol/L — ABNORMAL LOW (ref 3.5–5.1)
Sodium: 140 mmol/L (ref 135–145)

## 2014-09-08 LAB — CBC
HEMATOCRIT: 24.9 % — AB (ref 36.0–46.0)
Hemoglobin: 8.1 g/dL — ABNORMAL LOW (ref 12.0–15.0)
MCH: 27.2 pg (ref 26.0–34.0)
MCHC: 32.5 g/dL (ref 30.0–36.0)
MCV: 83.6 fL (ref 78.0–100.0)
Platelets: 196 10*3/uL (ref 150–400)
RBC: 2.98 MIL/uL — AB (ref 3.87–5.11)
RDW: 14.1 % (ref 11.5–15.5)
WBC: 13.7 10*3/uL — AB (ref 4.0–10.5)

## 2014-09-08 MED ORDER — SODIUM CHLORIDE 0.9 % IV SOLN
INTRAVENOUS | Status: DC
  Administered 2014-09-08: 18:00:00 via INTRAVENOUS

## 2014-09-08 MED ORDER — PEG 3350-KCL-NA BICARB-NACL 420 G PO SOLR
4000.0000 mL | Freq: Once | ORAL | Status: AC
  Administered 2014-09-08: 4000 mL via ORAL
  Filled 2014-09-08: qty 4000

## 2014-09-08 NOTE — Progress Notes (Signed)
Subjective: Feeling much better after the disimpaction, but she still has abdominal pain.    Objective: Vital signs in last 24 hours: Temp:  [98.3 F (36.8 C)-99.9 F (37.7 C)] 98.3 F (36.8 C) (07/04 0553) Pulse Rate:  [80-90] 80 (07/04 0600) Resp:  [17-20] 17 (07/04 0553) BP: (79-89)/(42-50) 81/48 mmHg (07/04 0600) SpO2:  [96 %-100 %] 100 % (07/04 0553) Weight:  [92.488 kg (203 lb 14.4 oz)] 92.488 kg (203 lb 14.4 oz) (07/04 0553) Last BM Date: 09/06/14  Intake/Output from previous day: 07/03 0701 - 07/04 0700 In: 4147.5 [P.O.:1040; I.V.:2257.5; IV Piggyback:550] Out: 1500 [Urine:1500] Intake/Output this shift: Total I/O In: 100 [IV Piggyback:100] Out: -   General appearance: alert and no distress GI: tender in the LLQ and RLQ, but better  Lab Results:  Recent Labs  09/07/14 0903 09/07/14 1344 09/07/14 2133 09/08/14 0534  WBC 29.2*  --   --  13.7*  HGB 9.9* 9.2* 8.9* 8.1*  HCT 30.5* 27.8* 27.5* 24.9*  PLT 267  --   --  196   BMET  Recent Labs  09/07/14 0903 09/08/14 0534  NA 139 140  K 3.5 3.4*  CL 108 112*  CO2 21* 22  GLUCOSE 222* 123*  BUN 8 8  CREATININE 0.86 0.73  CALCIUM 8.3* 7.9*   LFT  Recent Labs  09/07/14 0903  PROT 5.6*  ALBUMIN 2.7*  AST 18  ALT 10*  ALKPHOS 69  BILITOT 0.5   PT/INR  Recent Labs  09/07/14 0903  LABPROT 15.8*  INR 1.24   Hepatitis Panel No results for input(s): HEPBSAG, HCVAB, HEPAIGM, HEPBIGM in the last 72 hours. C-Diff No results for input(s): CDIFFTOX in the last 72 hours. Fecal Lactopherrin No results for input(s): FECLLACTOFRN in the last 72 hours.  Studies/Results: Dg Chest Port 1 View  09/07/2014   CLINICAL DATA:  2-3 day history of shortness of breath. Leukocytosis. Smoker with current history of asthma and diabetes.  EXAM: PORTABLE CHEST - 1 VIEW  COMPARISON:  None.  FINDINGS: Cardiac silhouette normal in size for AP portable technique. Thoracic aorta mildly atherosclerotic. Hilar and  mediastinal contours otherwise unremarkable. Suboptimal inspiration. Lungs clear. Bronchovascular markings normal. Pulmonary vascularity normal. No visible pleural effusions. No pneumothorax.  IMPRESSION: Suboptimal inspiration.  No acute cardiopulmonary disease.   Electronically Signed   By: Evangeline Dakin M.D.   On: 09/07/2014 16:42    Medications:  Scheduled: . ALPRAZolam  0.5 mg Oral TID  . bisacodyl  10 mg Oral Daily  . DULoxetine  60 mg Oral BID  . fludrocortisone  0.1 mg Oral Daily  . insulin aspart  0-9 Units Subcutaneous TID WC  . insulin glargine  40 Units Subcutaneous QHS  . ipratropium-albuterol  3 mL Nebulization Q6H  . metronidazole  500 mg Intravenous 3 times per day  . midodrine  5 mg Oral TID  . mometasone-formoterol  2 puff Inhalation BID  . [START ON 09/10/2014] pantoprazole (PROTONIX) IV  40 mg Intravenous Q12H  . polyethylene glycol  17 g Oral BID  . potassium chloride SA  20 mEq Oral Daily  . rosuvastatin  40 mg Oral Daily  . sodium chloride  3 mL Intravenous Q12H  . topiramate  50 mg Oral BID   Continuous: . sodium chloride 75 mL/hr at 09/08/14 0631  . pantoprozole (PROTONIX) infusion 8 mg/hr (09/08/14 0740)    Assessment/Plan: 1) Fecal impaction. 2) Stercoral ulcer. 3) ABM pain. 4) Progressive anemia.   Clinically she is much better  after her disimpaction, but she has a worsening anemia and persistent abdominal pain.  I will schedule her for a colonoscopy tomorrow with [facility].  Plan: 1) Colonoscopy tomorrow.   LOS: 1 day   Esmeralda Malay D 09/08/2014, 8:46 AM

## 2014-09-08 NOTE — Progress Notes (Signed)
PATIENT DETAILS Name: Jacqueline Hansen Age: 74 y.o. Sex: female Date of Birth: 05-15-1940 Admit Date: 09/07/2014 Admitting Physician Theressa Millard, MD IRW:[id number], MD  Subjective: No abdominal pain. Had BM's yesterday. No melena or hematochezia.   Assessment/Plan: Active Problems: Abdominal pain:secondary to constipation with fecal impaction. Resolved after disimpaction.Abdomen is soft and with very minimal tenderness in the LLQ today.  ?Colitis seen on CT Abd done in [place], spoke with Dr Jonelle Sidle recommends discontinuing Abx and to monitor.Leukocytosis much improved as well.   ?Lower GI bleed:?secondary to a stecoral ulcer-no melena or hematochezia evident-however drop in Hb. GI planning on colonoscopy in am. Follow CBC.   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 Will increase IVF, continue with Midodrine and Florinef. Appears to be asymptomatic at this time.   Acute Urinary Retention:suspect this was secondary to pain. Will continue Foley catheter and attempt voiding trial in the next 1-2 days.  DTO:IZTIW suspicion for GI bleed-antiplatelets on hold. Follow and resume when able.  COPD:stable-lungs are clear. Continue nebs-follow  DM-2:CBG's stable.Continue SSI-resume Metformin when able.   Anxiety/Depression: continue Xanax and Cymbalta  Hx of seizure disorder:continue Topamax  Disposition: Remain inpatient  Antimicrobial agents  See below  Anti-infectives    Start     Dose/Rate Route Frequency Ordered Stop   09/07/14 1600  levofloxacin (LEVAQUIN) IVPB 750 mg     750 mg 100 mL/hr over 90 Minutes Intravenous Every 24 hours 09/07/14 1236 09/07/14 1730   09/07/14 0800  metroNIDAZOLE (FLAGYL) IVPB 500 mg     500 mg 100 mL/hr over 60 Minutes Intravenous 3 times per day 09/07/14 0713     09/07/14 0800  levofloxacin (LEVAQUIN) IVPB 750 mg  Status:  Discontinued     750 mg 100 mL/hr  over 90 Minutes Intravenous  Once 09/07/14 0725 09/07/14 1236     DVT Prophylaxis: SCD's  Code Status: Full code  Family Communication None at bedside  Procedures: None  CONSULTS:  GI  Time spent 35 minutes-Greater than 50% of this time was spent in counseling, explanation of diagnosis, planning of further management, and coordination of care.  MEDICATIONS: Scheduled Meds: . ALPRAZolam  0.5 mg Oral TID  . bisacodyl  10 mg Oral Daily  . DULoxetine  60 mg Oral BID  . fludrocortisone  0.1 mg Oral Daily  . insulin aspart  0-9 Units Subcutaneous TID WC  . insulin glargine  40 Units Subcutaneous QHS  . ipratropium-albuterol  3 mL Nebulization Q6H  . metronidazole  500 mg Intravenous 3 times per day  . midodrine  5 mg Oral TID  . mometasone-formoterol  2 puff Inhalation BID  . [START ON 09/10/2014] pantoprazole (PROTONIX) IV  40 mg Intravenous Q12H  . potassium chloride SA  20 mEq Oral Daily  . rosuvastatin  40 mg Oral Daily  . sodium chloride  3 mL Intravenous Q12H  . topiramate  50 mg Oral BID   Continuous Infusions: . sodium chloride 75 mL/hr at 09/08/14 0631  . pantoprozole (PROTONIX) infusion 8 mg/hr (09/08/14 0740)   PRN Meds:.acetaminophen **OR** acetaminophen, albuterol, bisacodyl, meclizine, ondansetron **OR** ondansetron (ZOFRAN) IV, oxyCODONE    PHYSICAL EXAM: Vital signs in last 24 hours: Filed Vitals:   09/08/14 0127 09/08/14 0553 09/08/14 0600 09/08/14 0943  BP:  79/42 81/48   Pulse:  81 80   Temp:  98.3 F (36.8 C)  TempSrc:  Oral    Resp:  17    Height:      Weight:  92.488 kg (203 lb 14.4 oz)    SpO2: 100% 100%  100%    Weight change: 1.315 kg (2 lb 14.4 oz) Filed Weights   09/07/14 0650 09/08/14 0553  Weight: 91.173 kg (201 lb) 92.488 kg (203 lb 14.4 oz)   Body mass index is 36.13 kg/(m^2).   Gen Exam: Awake and alert with clear speech.   Neck: Supple, No JVD.   Chest: B/L Clear.   CVS: S1 S2 Regular, no murmurs.  Abdomen: soft, BS  +, minimally tender in the LLQ, non distended.  Extremities: no edema, lower extremities warm to touch. Neurologic: Non Focal.   Skin: No Rash.   Wounds: N/A.   Intake/Output from previous day:  Intake/Output Summary (Last 24 hours) at 09/08/14 1210 Last data filed at 09/08/14 0740  Gross per 24 hour  Intake 4007.5 ml  Output   1500 ml  Net 2507.5 ml     LAB RESULTS: CBC  Recent Labs Lab 09/07/14 0903 09/07/14 1344 09/07/14 2133 09/08/14 0534  WBC 29.2*  --   --  13.7*  HGB 9.9* 9.2* 8.9* 8.1*  HCT 30.5* 27.8* 27.5* 24.9*  PLT 267  --   --  196  MCV 82.7  --   --  83.6  MCH 26.8  --   --  27.2  MCHC 32.5  --   --  32.5  RDW 13.7  --   --  14.1    Chemistries   Recent Labs Lab 09/07/14 0903 09/08/14 0534  NA 139 140  K 3.5 3.4*  CL 108 112*  CO2 21* 22  GLUCOSE 222* 123*  BUN 8 8  CREATININE 0.86 0.73  CALCIUM 8.3* 7.9*    CBG:  Recent Labs Lab 09/07/14 1118 09/07/14 1639 09/07/14 2141 09/08/14 0637 09/08/14 1139  GLUCAP 216* 187* 163* 108* 153*    GFR Estimated Creatinine Clearance: 68.6 mL/min (by C-G formula based on Cr of 0.73).  Coagulation profile  Recent Labs Lab 09/07/14 0903  INR 1.24    Cardiac Enzymes No results for input(s): CKMB, TROPONINI, MYOGLOBIN in the last 168 hours.  Invalid input(s): CK  Invalid input(s): POCBNP No results for input(s): DDIMER in the last 72 hours. No results for input(s): HGBA1C in the last 72 hours. No results for input(s): CHOL, HDL, LDLCALC, TRIG, CHOLHDL, LDLDIRECT in the last 72 hours. No results for input(s): TSH, T4TOTAL, T3FREE, THYROIDAB in the last 72 hours.  Invalid input(s): FREET3 No results for input(s): VITAMINB12, FOLATE, FERRITIN, TIBC, IRON, RETICCTPCT in the last 72 hours. No results for input(s): LIPASE, AMYLASE in the last 72 hours.  Urine Studies No results for input(s): UHGB, CRYS in the last 72 hours.  Invalid input(s): UACOL, UAPR, USPG, UPH, UTP, UGL, UKET,  UBIL, UNIT, UROB, ULEU, UEPI, UWBC, URBC, UBAC, CAST, UCOM, BILUA  MICROBIOLOGY: Recent Results (from the past 240 hour(s))  Culture, blood (routine x 2)     Status: None (Preliminary result)   Collection Time: 09/07/14 12:55 PM  Result Value Ref Range Status   Specimen Description BLOOD RIGHT WRIST  Final   Special Requests BOTTLES DRAWN AEROBIC AND ANAEROBIC 10CC  Final   Culture PENDING  Incomplete   Report Status PENDING  Incomplete  Culture, blood (routine x 2)     Status: None (Preliminary result)   Collection Time: 09/07/14  1:00 PM  Result Value  Ref Range Status   Specimen Description BLOOD LEFT HAND  Final   Special Requests BOTTLES DRAWN AEROBIC AND ANAEROBIC 10CC  Final   Culture PENDING  Incomplete   Report Status PENDING  Incomplete    RADIOLOGY STUDIES/RESULTS: Dg Chest Port 1 View  09/07/2014   CLINICAL DATA:  2-3 day history of shortness of breath. Leukocytosis. Smoker with current history of asthma and diabetes.  EXAM: PORTABLE CHEST - 1 VIEW  COMPARISON:  None.  FINDINGS: Cardiac silhouette normal in size for AP portable technique. Thoracic aorta mildly atherosclerotic. Hilar and mediastinal contours otherwise unremarkable. Suboptimal inspiration. Lungs clear. Bronchovascular markings normal. Pulmonary vascularity normal. No visible pleural effusions. No pneumothorax.  IMPRESSION: Suboptimal inspiration.  No acute cardiopulmonary disease.   Electronically Signed   By: Evangeline Dakin M.D.   On: 09/07/2014 16:42    Oren Binet, MD  [facility] Pager:[phone]  If 7PM-7AM, please contact night-coverage [email or web address] Password [email or web address] 09/08/2014, 12:10 PM   LOS: 1 day

## 2014-09-09 ENCOUNTER — Encounter (HOSPITAL_COMMUNITY): Payer: Self-pay | Admitting: *Deleted

## 2014-09-09 ENCOUNTER — Encounter (HOSPITAL_COMMUNITY)
Admission: AD | Disposition: A | Discharge status: Home / Self Care | Payer: Self-pay | Source: Other Acute Inpatient Hospital | Attending: Internal Medicine

## 2014-09-09 DIAGNOSIS — K626 Ulcer of anus and rectum: Secondary | ICD-10-CM

## 2014-09-09 DIAGNOSIS — K625 Hemorrhage of anus and rectum: Secondary | ICD-10-CM

## 2014-09-09 DIAGNOSIS — D125 Benign neoplasm of sigmoid colon: Secondary | ICD-10-CM | POA: Insufficient documentation

## 2014-09-09 HISTORY — PX: COLONOSCOPY: SHX5424

## 2014-09-09 LAB — CBC
HEMATOCRIT: 25.7 % — AB (ref 36.0–46.0)
HEMOGLOBIN: 8.2 g/dL — AB (ref 12.0–15.0)
MCH: 27.4 pg (ref 26.0–34.0)
MCHC: 31.9 g/dL (ref 30.0–36.0)
MCV: 86 fL (ref 78.0–100.0)
Platelets: 172 10*3/uL (ref 150–400)
RBC: 2.99 MIL/uL — AB (ref 3.87–5.11)
RDW: 14.3 % (ref 11.5–15.5)
WBC: 9.4 10*3/uL (ref 4.0–10.5)

## 2014-09-09 LAB — BASIC METABOLIC PANEL
Anion gap: 6 (ref 5–15)
CHLORIDE: 115 mmol/L — AB (ref 101–111)
CO2: 20 mmol/L — ABNORMAL LOW (ref 22–32)
Calcium: 7.5 mg/dL — ABNORMAL LOW (ref 8.9–10.3)
Creatinine, Ser: 0.59 mg/dL (ref 0.44–1.00)
GFR calc non Af Amer: >60 mL/min (ref >60)
Glucose, Bld: 93 mg/dL (ref 65–99)
Potassium: 3.2 mmol/L — ABNORMAL LOW (ref 3.5–5.1)
Sodium: 141 mmol/L (ref 135–145)

## 2014-09-09 LAB — HEMOGLOBIN AND HEMATOCRIT, BLOOD
HCT: 24.5 % — ABNORMAL LOW (ref 36.0–46.0)
Hemoglobin: 7.9 g/dL — ABNORMAL LOW (ref 12.0–15.0)

## 2014-09-09 LAB — GLUCOSE, CAPILLARY
GLUCOSE-CAPILLARY: 108 mg/dL — AB (ref 65–99)
GLUCOSE-CAPILLARY: 131 mg/dL — AB (ref 65–99)
Glucose-Capillary: 96 mg/dL (ref 65–99)
Glucose-Capillary: 115 mg/dL — ABNORMAL HIGH (ref 65–99)

## 2014-09-09 SURGERY — COLONOSCOPY
Anesthesia: Moderate Sedation

## 2014-09-09 MED ORDER — MIDAZOLAM HCL 5 MG/ML IJ SOLN
INTRAMUSCULAR | Status: AC
  Filled 2014-09-09: qty 2

## 2014-09-09 MED ORDER — FENTANYL CITRATE (PF) 100 MCG/2ML IJ SOLN
INTRAMUSCULAR | Status: AC
  Filled 2014-09-09: qty 2

## 2014-09-09 MED ORDER — FENTANYL CITRATE (PF) 100 MCG/2ML IJ SOLN
INTRAMUSCULAR | Status: DC | PRN
  Administered 2014-09-09 (×3): 25 ug via INTRAVENOUS

## 2014-09-09 MED ORDER — MIDAZOLAM HCL 5 MG/5ML IJ SOLN
INTRAMUSCULAR | Status: DC | PRN
  Administered 2014-09-09 (×4): 1 mg via INTRAVENOUS
  Administered 2014-09-09: 2 mg via INTRAVENOUS

## 2014-09-09 MED ORDER — PANTOPRAZOLE SODIUM 40 MG PO TBEC
40.0000 mg | DELAYED_RELEASE_TABLET | Freq: Every day | ORAL | Status: DC
  Administered 2014-09-09 – 2014-09-12 (×4): 40 mg via ORAL
  Filled 2014-09-09 (×4): qty 1

## 2014-09-09 NOTE — Op Note (Signed)
[facility] [place], [address]   COLONOSCOPY PROCEDURE REPORT  PATIENT: Desmond, Tufano  MR#: [id number] BIRTHDATE: 1940-07-20 , 73  yrs. old GENDER: female ENDOSCOPIST: Eustace Quail, MD REFERRED NO:[facility] PROCEDURE DATE:  09/09/2014 PROCEDURE:   Colonoscopy, diagnostic and Colonoscopy with snare polypectomy x 1 First Screening Colonoscopy - Avg.  risk and is 50 yrs.  old or older - No.  Prior Negative Screening - Now for repeat screening. N/A  History of Adenoma - Now for follow-up colonoscopy & has been > or = to 3 yrs.  N/A  Polyps removed today? Yes ASA CLASS:   Class III INDICATIONS:Evaluation of unexplained GI bleeding and Patient is not applicable for Colorectal Neoplasm Risk Assessment for this procedure.  . Recently disimpacted MEDICATIONS: Fentanyl 75 mcg IV and Versed 6 mg IV  DESCRIPTION OF PROCEDURE:   After the risks benefits and alternatives of the procedure were thoroughly explained, informed consent was obtained.  The digital rectal exam revealed no abnormalities of the rectum, but tenderness present.   The [employer] Adult Colon [id number]  endoscope was introduced through the anus and advanced to the cecum, which was identified by both the appendix and ileocecal valve. No adverse events experienced.   The quality of the prep was good.  (MoviPrep was used)  The instrument was then slowly withdrawn as the colon was fully examined. Estimated blood loss is zero unless otherwise noted in this procedure report.  COLON FINDINGS: A single polyp measuring 8 mm in size was found in the sigmoid colon.  A polypectomy was performed with a cold snare. The resection was complete, the polyp tissue was completely retrieved and sent to histology.   There was moderate diverticulosis noted in the right colon.   Multiple benign-appearing non-bleeding ulcers were found in the rectum. The examination was otherwise normal.   Retroflexion was not performed, due to pain. However rectum completely examined from the anal verge. The time to cecum = 5 Withdrawal time = 15   The scope was withdrawn and the procedure completed. COMPLICATIONS: There were no immediate complications.  ENDOSCOPIC IMPRESSION: 1.   Single polyp was found in the sigmoid colon; polypectomy was performed with a cold snare 2.   Moderate diverticulosis was noted in the right colon 3.   Multiple ulcers were found in the rectum , secondary to fecal impaction 4.   The examination was otherwise normal  RECOMMENDATIONS: 1.The patient needs a daily bowel regimen, such as MiraLAX, to achieve daily bowel movements and avoid recurrent fecal impaction. No further recommendations or plans from GI perspective. No routine GI follow-up required. Will sign off  eSigned:  Eustace Quail, MD 09/09/2014 11:37 AM   cc: The Patient

## 2014-09-09 NOTE — Care Management (Signed)
Important Message  Patient Details  Name: Jacqueline Hansen MRN: [id number] Date of Birth: [birth year]   Medicare Important Message Given:  Yes-second notification given    [name] 09/09/2014, 10:33 AM

## 2014-09-09 NOTE — Interval H&P Note (Signed)
History and Physical Interval Note:  09/09/2014 10:41 AM  Jacqueline Hansen  has presented today for surgery, with the diagnosis of ABM pain and progressive anemia  The various methods of treatment have been discussed with the patient and family. After consideration of risks, benefits and other options for treatment, the patient has consented to  Procedure(s): COLONOSCOPY (N/A) as a surgical intervention .  The patient's history has been reviewed, patient examined, no change in status, stable for surgery.  I have reviewed the patient's chart and labs.  Questions were answered to the patient's satisfaction.     Scarlette Shorts

## 2014-09-09 NOTE — Progress Notes (Signed)
Utilization review completed. Kaniel Kiang, RN, BSN. 

## 2014-09-09 NOTE — Evaluation (Signed)
Physical Therapy Evaluation Patient Details Name: Jacqueline Hansen MRN: [id number] DOB: 1940-03-19 Today's Date: 09/09/2014   History of Present Illness  Jacqueline Hansen is a 74 y.o. female, medical history of COPD, diabetes mellitus, seizures, PAD, on aspirin and Plavix, resents to that hospital complaining of urinary retention, and dark color stools. Pt s/p disimpaction and colonoscopy with great improvement in symptoms  Clinical Impression  Pt moving well with ability to perform basic transfers and mobility and family assist available at home. Pt educated for recommendation to use RW at all times not cane secondary to repeated falls. Pt encouraged to increase ambulation and activity to improve function. Pt will benefit from acute therapy to maximize mobility, gait and activity tolerance to return pt to mod I level of function.      Follow Up Recommendations No PT follow up    Equipment Recommendations  None recommended by PT    Recommendations for Other Services       Precautions / Restrictions Precautions Precautions: Fall      Mobility  Bed Mobility Overal bed mobility: Modified Independent                Transfers Overall transfer level: Modified independent                  Ambulation/Gait Ambulation/Gait assistance: Supervision Ambulation Distance (Feet): 90 Feet Assistive device: Rolling walker (2 wheeled) Gait Pattern/deviations: Step-through pattern;Decreased stride length;Trunk flexed   Gait velocity interpretation: Below normal speed for age/gender General Gait Details: cues for posture and position in RW  Stairs            Wheelchair Mobility    Modified Rankin (Stroke Patients Only)       Balance Overall balance assessment: History of Falls                                           Pertinent Vitals/Pain Pain Assessment: No/denies pain    Home Living Family/patient expects to be discharged to:: Private  residence Living Arrangements: Children Available Help at Discharge: Family;Available 24 hours/day Type of Home: Mobile home Home Access: Stairs to enter Entrance Stairs-Rails: Right Entrance Stairs-Number of Steps: 3 Home Layout: One level Home Equipment: Walker - 2 wheels;Cane - single point;Bedside commode      Prior Function Level of Independence: Independent with assistive device(s)         Comments: pt typically uses a cane but does endorse multiple falls in the last year. She doesn't drive and has assist for household management from dgtr     Hand Dominance        Extremity/Trunk Assessment   Upper Extremity Assessment: Overall WFL for tasks assessed           Lower Extremity Assessment: Overall WFL for tasks assessed      Cervical / Trunk Assessment: Normal  Communication   Communication: No difficulties  Cognition Arousal/Alertness: Awake/alert Behavior During Therapy: WFL for tasks assessed/performed Overall Cognitive Status: Within Functional Limits for tasks assessed                      General Comments      Exercises        Assessment/Plan    PT Assessment Patient needs continued PT services  PT Diagnosis Difficulty walking   PT Problem List Decreased activity tolerance;Decreased balance;Decreased knowledge of use of  DME  PT Treatment Interventions Gait training;DME instruction;Stair training;Functional mobility training;Therapeutic activities;Therapeutic exercise;Patient/family education;Balance training   PT Goals (Current goals can be found in the Care Plan section) Acute Rehab PT Goals Patient Stated Goal: return home and eat PT Goal Formulation: With patient Time For Goal Achievement: 09/23/14 Potential to Achieve Goals: Good    Frequency Min 3X/week   Barriers to discharge        Co-evaluation               End of Session   Activity Tolerance: Patient tolerated treatment well Patient left: in chair;with  call bell/phone within Hansen Nurse Communication: Mobility status         Time: 3662-9476 PT Time Calculation (min) (ACUTE ONLY): 21 min   Charges:   PT Evaluation $Initial PT Evaluation Tier I: 1 Procedure     PT G CodesMelford Hansen 09/09/2014, 1:57 PM Jacqueline Hansen, [place]

## 2014-09-09 NOTE — Progress Notes (Signed)
PATIENT DETAILS Name: Jacqueline Hansen Age: 74 y.o. Sex: female Date of Birth: [birth year] Admit Date: 09/07/2014 Admitting Physician Theressa Millard, MD GYJ:EHUDJSH,FWYOVZCH, MD  Subjective: Feels significantly better today-had numerous bowel movements with bowel prep for colonoscopy. Claims no melanotic hematochezia with bowel prep.  Assessment/Plan: Active Problems: Abdominal pain:secondary to constipation with fecal impaction. Resolved after disimpaction.Abdomen is soft and with very minimal tenderness in the LLQ today.  ?Colitis seen on CT Abd done in [place], spoke with Dr Jonelle Sidle recommends discontinuing Abx and to monitor.Leukocytosis has resolved. Continue to monitor off antibiotics.  ?Lower GI bleed:?secondary to a stecoral ulcer-no melena or hematochezia evident-however drop in Hb-however remains stable over the past few days.. GI consulted, underwent colonoscopy on 7/5 which showed a single polyp in the sigmoid colon, along with diverticulosis in the right colon. It also showed multiple ulcers in the rectum which is felt secondary to fecal impaction. Follow CBC.   Anemia: Suspect worsening anemia likely secondary to acute her last, IV fluid dilution and? Mild GI bleed/acute blood loss. Improvement remained stable over the past few days. Follow and transfuse as needed  Street of orthostatic Hypotension: Continue Midodrine and Florinef  Acute Urinary Retention:suspect this was secondary to pain. Only catheter placed on admission. Will attempt a voiding trial today.   YIF:OYDXA suspicion for GI bleed on admission-antiplatelets held. Since hemoglobin stable, no overt bleeding, we will resume Plavix.  COPD:stable-lungs are clear. Continue nebs-follow  DM-2:CBG's stable.Continue Lantus 40 units daily at bedtime and SSI-resume Metformin when able.   Anxiety/Depression: continue Xanax and Cymbalta  Hx of seizure disorder:continue Topamax  Disposition: Remain  inpatient-home in the next 1-2 days  Antimicrobial agents  See below  Anti-infectives    Start     Dose/Rate Route Frequency Ordered Stop   09/07/14 1600  levofloxacin (LEVAQUIN) IVPB 750 mg     750 mg 100 mL/hr over 90 Minutes Intravenous Every 24 hours 09/07/14 1236 09/07/14 1730   09/07/14 0800  metroNIDAZOLE (FLAGYL) IVPB 500 mg  Status:  Discontinued     500 mg 100 mL/hr over 60 Minutes Intravenous 3 times per day 09/07/14 0713 09/08/14 1231   09/07/14 0800  levofloxacin (LEVAQUIN) IVPB 750 mg  Status:  Discontinued     750 mg 100 mL/hr over 90 Minutes Intravenous  Once 09/07/14 0725 09/07/14 1236     DVT Prophylaxis: SCD's  Code Status: Full code  Family Communication None at bedside  Procedures: None  CONSULTS:  GI  Time spent 30 minutes-Greater than 50% of this time was spent in counseling, explanation of diagnosis, planning of further management, and coordination of care.  MEDICATIONS: Scheduled Meds: . ALPRAZolam  0.5 mg Oral TID  . DULoxetine  60 mg Oral BID  . fludrocortisone  0.1 mg Oral Daily  . insulin aspart  0-9 Units Subcutaneous TID WC  . insulin glargine  40 Units Subcutaneous QHS  . midodrine  5 mg Oral TID  . mometasone-formoterol  2 puff Inhalation BID  . [START ON 09/10/2014] pantoprazole (PROTONIX) IV  40 mg Intravenous Q12H  . potassium chloride SA  20 mEq Oral Daily  . rosuvastatin  40 mg Oral Daily  . sodium chloride  3 mL Intravenous Q12H  . topiramate  50 mg Oral BID   Continuous Infusions: . sodium chloride 75 mL/hr at 09/09/14 0800  . pantoprozole (PROTONIX) infusion 8 mg/hr (09/08/14 2345)   PRN Meds:.acetaminophen **OR**  acetaminophen, albuterol, bisacodyl, meclizine, ondansetron **OR** ondansetron (ZOFRAN) IV, oxyCODONE    PHYSICAL EXAM: Vital signs in last 24 hours: Filed Vitals:   09/09/14 1230 09/09/14 1235 09/09/14 1236 09/09/14 1240  BP:  78/49 98/52 98/52   Pulse: 91  81 63  Temp:      TempSrc:      Resp: 21  16  15   Height:      Weight:      SpO2: 90%  93% 94%    Weight change:  Filed Weights   09/07/14 0650 09/08/14 0553  Weight: 91.173 kg (201 lb) 92.488 kg (203 lb 14.4 oz)   Body mass index is 36.13 kg/(m^2).   Gen Exam: Awake and alert with clear speech.   Neck: Supple, No JVD.   Chest: B/L Clear.   CVS: S1 S2 Regular, no murmurs.  Abdomen: soft, BS +, Non tender Extremities: no edema, lower extremities warm to touch. Neurologic: Non Focal.   Skin: No Rash.   Wounds: N/A.   Intake/Output from previous day:  Intake/Output Summary (Last 24 hours) at 09/09/14 1304 Last data filed at 09/09/14 1235  Gross per 24 hour  Intake 6486.5 ml  Output    750 ml  Net 5736.5 ml     LAB RESULTS: CBC  Recent Labs Lab 09/07/14 0903  09/07/14 2133 09/08/14 0534 09/08/14 1910 09/09/14 0540 09/09/14 0917  WBC 29.2*  --   --  13.7*  --   --  9.4  HGB 9.9*  < > 8.9* 8.1* 8.1* 7.9* 8.2*  HCT 30.5*  < > 27.5* 24.9* 25.0* 24.5* 25.7*  PLT 267  --   --  196  --   --  172  MCV 82.7  --   --  83.6  --   --  86.0  MCH 26.8  --   --  27.2  --   --  27.4  MCHC 32.5  --   --  32.5  --   --  31.9  RDW 13.7  --   --  14.1  --   --  14.3  < > = values in this interval not displayed.  Chemistries   Recent Labs Lab 09/07/14 0903 09/08/14 0534 09/09/14 0917  NA 139 140 141  K 3.5 3.4* 3.2*  CL 108 112* 115*  CO2 21* 22 20*  GLUCOSE 222* 123* 93  BUN 8 8 <5*  CREATININE 0.86 0.73 0.59  CALCIUM 8.3* 7.9* 7.5*    CBG:  Recent Labs Lab 09/08/14 0637 09/08/14 1139 09/08/14 1634 09/08/14 2104 09/09/14 0555  GLUCAP 108* 153* 158* 89 96    GFR Estimated Creatinine Clearance: 68.6 mL/min (by C-G formula based on Cr of 0.59).  Coagulation profile  Recent Labs Lab 09/07/14 0903  INR 1.24    Cardiac Enzymes No results for input(s): CKMB, TROPONINI, MYOGLOBIN in the last 168 hours.  Invalid input(s): CK  Invalid input(s): POCBNP No results for input(s): DDIMER in the last  72 hours. No results for input(s): HGBA1C in the last 72 hours. No results for input(s): CHOL, HDL, LDLCALC, TRIG, CHOLHDL, LDLDIRECT in the last 72 hours. No results for input(s): TSH, T4TOTAL, T3FREE, THYROIDAB in the last 72 hours.  Invalid input(s): FREET3 No results for input(s): VITAMINB12, FOLATE, FERRITIN, TIBC, IRON, RETICCTPCT in the last 72 hours. No results for input(s): LIPASE, AMYLASE in the last 72 hours.  Urine Studies No results for input(s): UHGB, CRYS in the last 72 hours.  Invalid input(s): UACOL,  UAPR, USPG, UPH, UTP, UGL, UKET, UBIL, UNIT, UROB, ULEU, UEPI, UWBC, URBC, UBAC, CAST, UCOM, BILUA  MICROBIOLOGY: Recent Results (from the past 240 hour(s))  Culture, blood (routine x 2)     Status: None (Preliminary result)   Collection Time: 09/07/14 12:55 PM  Result Value Ref Range Status   Specimen Description BLOOD RIGHT WRIST  Final   Special Requests BOTTLES DRAWN AEROBIC AND ANAEROBIC 10CC  Final   Culture NO GROWTH 1 DAY  Final   Report Status PENDING  Incomplete  Culture, blood (routine x 2)     Status: None (Preliminary result)   Collection Time: 09/07/14  1:00 PM  Result Value Ref Range Status   Specimen Description BLOOD LEFT HAND  Final   Special Requests BOTTLES DRAWN AEROBIC AND ANAEROBIC 10CC  Final   Culture NO GROWTH 1 DAY  Final   Report Status PENDING  Incomplete    RADIOLOGY STUDIES/RESULTS: Dg Chest Port 1 View  09/07/2014   CLINICAL DATA:  2-3 day history of shortness of breath. Leukocytosis. Smoker with current history of asthma and diabetes.  EXAM: PORTABLE CHEST - 1 VIEW  COMPARISON:  None.  FINDINGS: Cardiac silhouette normal in size for AP portable technique. Thoracic aorta mildly atherosclerotic. Hilar and mediastinal contours otherwise unremarkable. Suboptimal inspiration. Lungs clear. Bronchovascular markings normal. Pulmonary vascularity normal. No visible pleural effusions. No pneumothorax.  IMPRESSION: Suboptimal inspiration.  No acute  cardiopulmonary disease.   Electronically Signed   By: Evangeline Dakin M.D.   On: 09/07/2014 16:42    Oren Binet, MD  [facility] Pager:[phone]  If 7PM-7AM, please contact night-coverage [email or web address] Password [email or web address] 09/09/2014, 1:04 PM   LOS: 2 days

## 2014-09-09 NOTE — H&P (View-Only) (Signed)
Subjective: Feeling much better after the disimpaction, but she still has abdominal pain.    Objective: Vital signs in last 24 hours: Temp:  [98.3 F (36.8 C)-99.9 F (37.7 C)] 98.3 F (36.8 C) (07/04 0553) Pulse Rate:  [80-90] 80 (07/04 0600) Resp:  [17-20] 17 (07/04 0553) BP: (79-89)/(42-50) 81/48 mmHg (07/04 0600) SpO2:  [96 %-100 %] 100 % (07/04 0553) Weight:  [92.488 kg (203 lb 14.4 oz)] 92.488 kg (203 lb 14.4 oz) (07/04 0553) Last BM Date: 09/06/14  Intake/Output from previous day: 07/03 0701 - 07/04 0700 In: 4147.5 [P.O.:1040; I.V.:2257.5; IV Piggyback:550] Out: 1500 [Urine:1500] Intake/Output this shift: Total I/O In: 100 [IV Piggyback:100] Out: -   General appearance: alert and no distress GI: tender in the LLQ and RLQ, but better  Lab Results:  Recent Labs  09/07/14 0903 09/07/14 1344 09/07/14 2133 09/08/14 0534  WBC 29.2*  --   --  13.7*  HGB 9.9* 9.2* 8.9* 8.1*  HCT 30.5* 27.8* 27.5* 24.9*  PLT 267  --   --  196   BMET  Recent Labs  09/07/14 0903 09/08/14 0534  NA 139 140  K 3.5 3.4*  CL 108 112*  CO2 21* 22  GLUCOSE 222* 123*  BUN 8 8  CREATININE 0.86 0.73  CALCIUM 8.3* 7.9*   LFT  Recent Labs  09/07/14 0903  PROT 5.6*  ALBUMIN 2.7*  AST 18  ALT 10*  ALKPHOS 69  BILITOT 0.5   PT/INR  Recent Labs  09/07/14 0903  LABPROT 15.8*  INR 1.24   Hepatitis Panel No results for input(s): HEPBSAG, HCVAB, HEPAIGM, HEPBIGM in the last 72 hours. C-Diff No results for input(s): CDIFFTOX in the last 72 hours. Fecal Lactopherrin No results for input(s): FECLLACTOFRN in the last 72 hours.  Studies/Results: Dg Chest Port 1 View  09/07/2014   CLINICAL DATA:  2-3 day history of shortness of breath. Leukocytosis. Smoker with current history of asthma and diabetes.  EXAM: PORTABLE CHEST - 1 VIEW  COMPARISON:  None.  FINDINGS: Cardiac silhouette normal in size for AP portable technique. Thoracic aorta mildly atherosclerotic. Hilar and  mediastinal contours otherwise unremarkable. Suboptimal inspiration. Lungs clear. Bronchovascular markings normal. Pulmonary vascularity normal. No visible pleural effusions. No pneumothorax.  IMPRESSION: Suboptimal inspiration.  No acute cardiopulmonary disease.   Electronically Signed   By: Evangeline Dakin M.Hansen.   On: 09/07/2014 16:42    Medications:  Scheduled: . ALPRAZolam  0.5 mg Oral TID  . bisacodyl  10 mg Oral Daily  . DULoxetine  60 mg Oral BID  . fludrocortisone  0.1 mg Oral Daily  . insulin aspart  0-9 Units Subcutaneous TID WC  . insulin glargine  40 Units Subcutaneous QHS  . ipratropium-albuterol  3 mL Nebulization Q6H  . metronidazole  500 mg Intravenous 3 times per day  . midodrine  5 mg Oral TID  . mometasone-formoterol  2 puff Inhalation BID  . [START ON 09/10/2014] pantoprazole (PROTONIX) IV  40 mg Intravenous Q12H  . polyethylene glycol  17 g Oral BID  . potassium chloride SA  20 mEq Oral Daily  . rosuvastatin  40 mg Oral Daily  . sodium chloride  3 mL Intravenous Q12H  . topiramate  50 mg Oral BID   Continuous: . sodium chloride 75 mL/hr at 09/08/14 0631  . pantoprozole (PROTONIX) infusion 8 mg/hr (09/08/14 0740)    Assessment/Plan: 1) Fecal impaction. 2) Stercoral ulcer. 3) ABM pain. 4) Progressive anemia.   Clinically she is much better  after her disimpaction, but she has a worsening anemia and persistent abdominal pain.  I will schedule her for a colonoscopy tomorrow with [facility].  Plan: 1) Colonoscopy tomorrow.   LOS: 1 day   Jacqueline Hansen 09/08/2014, 8:46 AM

## 2014-09-10 ENCOUNTER — Encounter (HOSPITAL_COMMUNITY): Payer: Self-pay | Admitting: Internal Medicine

## 2014-09-10 DIAGNOSIS — R112 Nausea with vomiting, unspecified: Secondary | ICD-10-CM

## 2014-09-10 DIAGNOSIS — K922 Gastrointestinal hemorrhage, unspecified: Secondary | ICD-10-CM

## 2014-09-10 LAB — GLUCOSE, CAPILLARY
GLUCOSE-CAPILLARY: 128 mg/dL — AB (ref 65–99)
Glucose-Capillary: 139 mg/dL — ABNORMAL HIGH (ref 65–99)
Glucose-Capillary: 124 mg/dL — ABNORMAL HIGH (ref 65–99)
Glucose-Capillary: 180 mg/dL — ABNORMAL HIGH (ref 65–99)

## 2014-09-10 LAB — BASIC METABOLIC PANEL
ANION GAP: 5 (ref 5–15)
CALCIUM: 8 mg/dL — AB (ref 8.9–10.3)
CHLORIDE: 115 mmol/L — AB (ref 101–111)
CO2: 21 mmol/L — AB (ref 22–32)
Creatinine, Ser: 0.62 mg/dL (ref 0.44–1.00)
GFR calc Af Amer: >60 mL/min (ref >60)
GFR calc non Af Amer: >60 mL/min (ref >60)
Glucose, Bld: 155 mg/dL — ABNORMAL HIGH (ref 65–99)
Potassium: 3.7 mmol/L (ref 3.5–5.1)
Sodium: 141 mmol/L (ref 135–145)

## 2014-09-10 LAB — CBC
HCT: 26.3 % — ABNORMAL LOW (ref 36.0–46.0)
HEMOGLOBIN: 8.5 g/dL — AB (ref 12.0–15.0)
MCH: 27.2 pg (ref 26.0–34.0)
MCHC: 32.3 g/dL (ref 30.0–36.0)
MCV: 84.3 fL (ref 78.0–100.0)
Platelets: 198 10*3/uL (ref 150–400)
RBC: 3.12 MIL/uL — ABNORMAL LOW (ref 3.87–5.11)
RDW: 14.2 % (ref 11.5–15.5)
WBC: 11.6 10*3/uL — ABNORMAL HIGH (ref 4.0–10.5)

## 2014-09-10 MED ORDER — SODIUM CHLORIDE 0.9 % IV SOLN
INTRAVENOUS | Status: DC
  Administered 2014-09-10: 14:00:00 via INTRAVENOUS
  Administered 2014-09-11: 1000 mL via INTRAVENOUS
  Administered 2014-09-12 (×2): via INTRAVENOUS

## 2014-09-10 MED ORDER — POLYETHYLENE GLYCOL 3350 17 G PO PACK
17.0000 g | PACK | Freq: Every day | ORAL | Status: DC
Start: 2014-09-10 — End: 2014-09-12
  Administered 2014-09-10 – 2014-09-12 (×3): 17 g via ORAL
  Filled 2014-09-10 (×3): qty 1

## 2014-09-10 MED ORDER — BISACODYL 5 MG PO TBEC: 5.0000 mg | DELAYED_RELEASE_TABLET | Freq: Every day | ORAL | Status: DC | PRN

## 2014-09-10 MED ORDER — POLYETHYLENE GLYCOL 3350 17 GM/SCOOP PO POWD: 1.0000 | Freq: Once | ORAL | Status: DC

## 2014-09-10 MED ORDER — DOCUSATE SODIUM 100 MG PO CAPS: 100.0000 mg | ORAL_CAPSULE | Freq: Two times a day (BID) | ORAL | Status: DC

## 2014-09-10 MED ORDER — DOCUSATE SODIUM 100 MG PO CAPS
100.0000 mg | ORAL_CAPSULE | Freq: Two times a day (BID) | ORAL | Status: DC
  Administered 2014-09-10 – 2014-09-12 (×4): 100 mg via ORAL
  Filled 2014-09-10 (×7): qty 1

## 2014-09-10 MED ORDER — INSULIN GLARGINE 100 UNIT/ML ~~LOC~~ SOLN: 40.0000 [IU] | Freq: Every day | SUBCUTANEOUS | Status: DC

## 2014-09-10 NOTE — Progress Notes (Signed)
1200 regurge right after food and drink intake .Seen and evaluated by MD with orders . Pt informed Gastroenterology consult called in/

## 2014-09-10 NOTE — Progress Notes (Signed)
PATIENT DETAILS Name: Jacqueline Hansen Age: 74 y.o. Sex: female Date of Birth: [birth year] Admit Date: 09/07/2014 Admitting Physician Theressa Millard, MD TWS:[id number], MD  Subjective: Patient reporting having difficulties keeping by mouth down today, states having N/V with meals. She denies abdominal pain, bloody stools, melanoma.   Assessment/Plan: Active Problems: Abdominal pain: -Likely secondary to constipation with fecal impaction. Resolved after disimpaction. -Continue bowel regimen with miralax and colace  ?Lower GI bleed: -Possibly secondary to a stecoral ulcer-no melena or hematochezia  -GI consulted, underwent colonoscopy on 7/5 which showed a single polyp in the sigmoid colon, along with diverticulosis in the right colon. It also showed multiple ulcers in the rectum which is felt secondary to fecal impaction.  -Hg remains stable with am labs showing Hg of 8.5  Nausea/Vomiting -Patient reporting multiple episodes of nausea vomiting after eating, unable to hold by mouth down. -Case was discussed with GI who recommended further workup with barium swallow study. She reports undergoing EGDs where she underwent "stretching" of her esophagus, suggesting being treated for previous esophageal strictures -Other possibilities include diabetic gastroparesis -Interestingly she was able to tolerate bowel prep yesterday -Plan for barium swallow study today, if negative will proceed with gastric emptying study  Anemia:  -Hg remains stable at 8.5  Street of orthostatic Hypotension:  -Continue Midodrine and Florinef  Acute Urinary Retention: -Suspect this was secondary to pain. Only catheter placed on admission. Will attempt a voiding trial today.   PAD: -Since suspicion for GI bleed on admission-antiplatelets held. Since hemoglobin stable, no overt bleeding, we will resume Plavix.  COPD: -Stable-lungs are clear. Continue nebs-follow  DM-2: -CBG's  stable.Continue Lantus 40 units daily at bedtime and SSI-resume Metformin when able.   Anxiety/Depression:  -Continue Xanax and Cymbalta  Hx of seizure disorder: -Continue Topamax  Disposition: Remain inpatient-home in the next 1-2 days  Antimicrobial agents  See below  Anti-infectives    Start     Dose/Rate Route Frequency Ordered Stop   09/07/14 1600  levofloxacin (LEVAQUIN) IVPB 750 mg     750 mg 100 mL/hr over 90 Minutes Intravenous Every 24 hours 09/07/14 1236 09/07/14 1730   09/07/14 0800  metroNIDAZOLE (FLAGYL) IVPB 500 mg  Status:  Discontinued     500 mg 100 mL/hr over 60 Minutes Intravenous 3 times per day 09/07/14 0713 09/08/14 1231   09/07/14 0800  levofloxacin (LEVAQUIN) IVPB 750 mg  Status:  Discontinued     750 mg 100 mL/hr over 90 Minutes Intravenous  Once 09/07/14 0725 09/07/14 1236     DVT Prophylaxis: SCD's  Code Status: Full code  Family Communication None at bedside  Procedures: None  CONSULTS:  GI  Time spent 30 minutes-Greater than 50% of this time was spent in counseling, explanation of diagnosis, planning of further management, and coordination of care.  MEDICATIONS: Scheduled Meds: . ALPRAZolam  0.5 mg Oral TID  . DULoxetine  60 mg Oral BID  . fludrocortisone  0.1 mg Oral Daily  . insulin aspart  0-9 Units Subcutaneous TID WC  . insulin glargine  40 Units Subcutaneous QHS  . midodrine  5 mg Oral TID  . mometasone-formoterol  2 puff Inhalation BID  . pantoprazole  40 mg Oral Q1200  . potassium chloride SA  20 mEq Oral Daily  . rosuvastatin  40 mg Oral Daily  . sodium chloride  3 mL Intravenous Q12H  . topiramate  50 mg  Oral BID   Continuous Infusions: . sodium chloride     PRN Meds:.acetaminophen **OR** acetaminophen, albuterol, bisacodyl, meclizine, ondansetron **OR** ondansetron (ZOFRAN) IV, oxyCODONE    PHYSICAL EXAM: Vital signs in last 24 hours: Filed Vitals:   09/09/14 2118 09/09/14 2141 09/10/14 0551 09/10/14 0835    BP:  122/65 107/66 112/64  Pulse: 68 82 86 85  Temp:  98.6 F (37 C) 99.2 F (37.3 C) 97.8 F (36.6 C)  TempSrc:  Oral Oral Oral  Resp: 20 19 20 26   Height:      Weight:   97.297 kg (214 lb 8 oz)   SpO2:  95% 94% 93%    Weight change:  Filed Weights   09/07/14 0650 09/08/14 0553 09/10/14 0551  Weight: 91.173 kg (201 lb) 92.488 kg (203 lb 14.4 oz) 97.297 kg (214 lb 8 oz)   Body mass index is 38.01 kg/(m^2).   Gen Exam: Awake and alert with clear speech.   Neck: Supple, No JVD.   Chest: B/L Clear.   CVS: S1 S2 Regular, no murmurs.  Abdomen: soft, BS +, Non tender Extremities: no edema, lower extremities warm to touch. Neurologic: Non Focal.   Skin: No Rash.   Wounds: N/A.   Intake/Output from previous day:  Intake/Output Summary (Last 24 hours) at 09/10/14 1358 Last data filed at 09/10/14 1300  Gross per 24 hour  Intake 1593.58 ml  Output   1250 ml  Net 343.58 ml     LAB RESULTS: CBC  Recent Labs Lab 09/07/14 0903  09/08/14 0534 09/08/14 1910 09/09/14 0540 09/09/14 0917 09/10/14 0246  WBC 29.2*  --  13.7*  --   --  9.4 11.6*  HGB 9.9*  < > 8.1* 8.1* 7.9* 8.2* 8.5*  HCT 30.5*  < > 24.9* 25.0* 24.5* 25.7* 26.3*  PLT 267  --  196  --   --  172 198  MCV 82.7  --  83.6  --   --  86.0 84.3  MCH 26.8  --  27.2  --   --  27.4 27.2  MCHC 32.5  --  32.5  --   --  31.9 32.3  RDW 13.7  --  14.1  --   --  14.3 14.2  < > = values in this interval not displayed.  Chemistries   Recent Labs Lab 09/07/14 0903 09/08/14 0534 09/09/14 0917 09/10/14 0246  NA 139 140 141 141  K 3.5 3.4* 3.2* 3.7  CL 108 112* 115* 115*  CO2 21* 22 20* 21*  GLUCOSE 222* 123* 93 155*  BUN 8 8 <5* <5*  CREATININE 0.86 0.73 0.59 0.62  CALCIUM 8.3* 7.9* 7.5* 8.0*    CBG:  Recent Labs Lab 09/09/14 1311 09/09/14 1629 09/09/14 2137 09/10/14 0555 09/10/14 1134  GLUCAP 108* 115* 131* 128* 139*    GFR Estimated Creatinine Clearance: 70.6 mL/min (by C-G formula based on Cr of  0.62).  Coagulation profile  Recent Labs Lab 09/07/14 0903  INR 1.24    Cardiac Enzymes No results for input(s): CKMB, TROPONINI, MYOGLOBIN in the last 168 hours.  Invalid input(s): CK  Invalid input(s): POCBNP No results for input(s): DDIMER in the last 72 hours. No results for input(s): HGBA1C in the last 72 hours. No results for input(s): CHOL, HDL, LDLCALC, TRIG, CHOLHDL, LDLDIRECT in the last 72 hours. No results for input(s): TSH, T4TOTAL, T3FREE, THYROIDAB in the last 72 hours.  Invalid input(s): FREET3 No results for input(s): VITAMINB12, FOLATE, FERRITIN, TIBC,  IRON, RETICCTPCT in the last 72 hours. No results for input(s): LIPASE, AMYLASE in the last 72 hours.  Urine Studies No results for input(s): UHGB, CRYS in the last 72 hours.  Invalid input(s): UACOL, UAPR, USPG, UPH, UTP, UGL, UKET, UBIL, UNIT, UROB, ULEU, UEPI, UWBC, URBC, UBAC, CAST, UCOM, BILUA  MICROBIOLOGY: Recent Results (from the past 240 hour(s))  Culture, blood (routine x 2)     Status: None (Preliminary result)   Collection Time: 09/07/14 12:55 PM  Result Value Ref Range Status   Specimen Description BLOOD RIGHT WRIST  Final   Special Requests BOTTLES DRAWN AEROBIC AND ANAEROBIC 10CC  Final   Culture NO GROWTH 3 DAYS  Final   Report Status PENDING  Incomplete  Culture, blood (routine x 2)     Status: None (Preliminary result)   Collection Time: 09/07/14  1:00 PM  Result Value Ref Range Status   Specimen Description BLOOD LEFT HAND  Final   Special Requests BOTTLES DRAWN AEROBIC AND ANAEROBIC 10CC  Final   Culture NO GROWTH 3 DAYS  Final   Report Status PENDING  Incomplete    RADIOLOGY STUDIES/RESULTS: Dg Chest Port 1 View  09/07/2014   CLINICAL DATA:  2-3 day history of shortness of breath. Leukocytosis. Smoker with current history of asthma and diabetes.  EXAM: PORTABLE CHEST - 1 VIEW  COMPARISON:  None.  FINDINGS: Cardiac silhouette normal in size for AP portable technique. Thoracic aorta  mildly atherosclerotic. Hilar and mediastinal contours otherwise unremarkable. Suboptimal inspiration. Lungs clear. Bronchovascular markings normal. Pulmonary vascularity normal. No visible pleural effusions. No pneumothorax.  IMPRESSION: Suboptimal inspiration.  No acute cardiopulmonary disease.   Electronically Signed   By: Evangeline Dakin M.D.   On: 09/07/2014 16:42    Kelvin Cellar, MD  [facility] Pager:[phone]  If 7PM-7AM, please contact night-coverage [email or web address] Password [email or web address] 09/10/2014, 1:58 PM   LOS: 3 days

## 2014-09-11 ENCOUNTER — Inpatient Hospital Stay (HOSPITAL_COMMUNITY): Payer: Medicare Other

## 2014-09-11 DIAGNOSIS — R7881 Bacteremia: Secondary | ICD-10-CM

## 2014-09-11 DIAGNOSIS — K5901 Slow transit constipation: Secondary | ICD-10-CM

## 2014-09-11 DIAGNOSIS — I370 Nonrheumatic pulmonary valve stenosis: Secondary | ICD-10-CM

## 2014-09-11 LAB — CBC
HCT: 25.5 % — ABNORMAL LOW (ref 36.0–46.0)
HEMOGLOBIN: 8.3 g/dL — AB (ref 12.0–15.0)
MCH: 27 pg (ref 26.0–34.0)
MCHC: 32.5 g/dL (ref 30.0–36.0)
MCV: 83.1 fL (ref 78.0–100.0)
Platelets: 192 10*3/uL (ref 150–400)
RBC: 3.07 MIL/uL — ABNORMAL LOW (ref 3.87–5.11)
RDW: 14 % (ref 11.5–15.5)
WBC: 9.5 10*3/uL (ref 4.0–10.5)

## 2014-09-11 LAB — GLUCOSE, CAPILLARY
GLUCOSE-CAPILLARY: 150 mg/dL — AB (ref 65–99)
GLUCOSE-CAPILLARY: 135 mg/dL — AB (ref 65–99)
Glucose-Capillary: 162 mg/dL — ABNORMAL HIGH (ref 65–99)
Glucose-Capillary: 163 mg/dL — ABNORMAL HIGH (ref 65–99)

## 2014-09-11 LAB — BASIC METABOLIC PANEL
Anion gap: 5 (ref 5–15)
BUN: <5 mg/dL — ABNORMAL LOW (ref 6–20)
CALCIUM: 7.9 mg/dL — AB (ref 8.9–10.3)
CO2: 21 mmol/L — ABNORMAL LOW (ref 22–32)
Chloride: 110 mmol/L (ref 101–111)
Creatinine, Ser: 0.53 mg/dL (ref 0.44–1.00)
GLUCOSE: 178 mg/dL — AB (ref 65–99)
POTASSIUM: 3.8 mmol/L (ref 3.5–5.1)
Sodium: 136 mmol/L (ref 135–145)

## 2014-09-11 MED ORDER — SULFAMETHOXAZOLE-TRIMETHOPRIM 400-80 MG PO TABS
1.0000 | ORAL_TABLET | Freq: Two times a day (BID) | ORAL | Status: DC
  Administered 2014-09-11 – 2014-09-12 (×3): 1 via ORAL
  Filled 2014-09-11 (×4): qty 1

## 2014-09-11 MED ORDER — VANCOMYCIN HCL 10 G IV SOLR
1750.0000 mg | Freq: Once | INTRAVENOUS | Status: AC
  Administered 2014-09-11: 1750 mg via INTRAVENOUS
  Filled 2014-09-11: qty 1750

## 2014-09-11 MED ORDER — PERFLUTREN LIPID MICROSPHERE
1.0000 mL | INTRAVENOUS | Status: AC | PRN
  Administered 2014-09-11: 2 mL via INTRAVENOUS
  Filled 2014-09-11: qty 10

## 2014-09-11 MED ORDER — SODIUM CHLORIDE 0.9 % IV SOLN
1250.0000 mg | Freq: Two times a day (BID) | INTRAVENOUS | Status: DC
  Administered 2014-09-12 (×2): 1250 mg via INTRAVENOUS
  Filled 2014-09-11 (×3): qty 1250

## 2014-09-11 MED ORDER — SODIUM CHLORIDE 0.9 % IJ SOLN
10.0000 mL | INTRAMUSCULAR | Status: DC | PRN
  Administered 2014-09-12 (×2): 10 mL
  Filled 2014-09-11 (×2): qty 40

## 2014-09-11 MED ORDER — HEPARIN SODIUM (PORCINE) 5000 UNIT/ML IJ SOLN
5000.0000 [IU] | Freq: Two times a day (BID) | INTRAMUSCULAR | Status: DC
  Administered 2014-09-11 – 2014-09-12 (×3): 5000 [IU] via SUBCUTANEOUS
  Filled 2014-09-11 (×4): qty 1

## 2014-09-11 NOTE — Progress Notes (Signed)
ANTIBIOTIC CONSULT NOTE - INITIAL  Pharmacy Consult for Vancomycin Indication: Bacteremia  No Known Allergies  Patient Measurements: Height: 5\' 3"  (160 cm) Weight: 213 lb 6.5 oz (96.8 kg) IBW/kg (Calculated) : 52.4  Vital Signs: Temp: 98.1 F (36.7 C) (07/07 0539) Temp Source: Oral (07/07 0539) BP: 122/60 mmHg (07/07 0539) Pulse Rate: 80 (07/07 0539) Intake/Output from previous day: 07/06 0701 - 07/07 0700 In: 1600 [P.O.:400; I.V.:1200] Out: 550 [Urine:550] Intake/Output from this shift:    Labs:  Recent Labs  09/09/14 0917 09/10/14 0246 09/11/14 0415  WBC 9.4 11.6* 9.5  HGB 8.2* 8.5* 8.3*  PLT 172 198 192  CREATININE 0.59 0.62 0.53   Estimated Creatinine Clearance: 69.4 mL/min (by C-G formula based on Cr of 0.53). No results for input(s): VANCOTROUGH, VANCOPEAK, VANCORANDOM, GENTTROUGH, GENTPEAK, GENTRANDOM, TOBRATROUGH, TOBRAPEAK, TOBRARND, AMIKACINPEAK, AMIKACINTROU, AMIKACIN in the last 72 hours.   Microbiology: Recent Results (from the past 720 hour(s))  Culture, blood (routine x 2)     Status: None (Preliminary result)   Collection Time: 09/07/14 12:55 PM  Result Value Ref Range Status   Specimen Description BLOOD RIGHT WRIST  Final   Special Requests BOTTLES DRAWN AEROBIC AND ANAEROBIC 10CC  Final   Culture NO GROWTH 3 DAYS  Final   Report Status PENDING  Incomplete  Culture, blood (routine x 2)     Status: None (Preliminary result)   Collection Time: 09/07/14  1:00 PM  Result Value Ref Range Status   Specimen Description BLOOD LEFT HAND  Final   Special Requests BOTTLES DRAWN AEROBIC AND ANAEROBIC 10CC  Final   Culture NO GROWTH 3 DAYS  Final   Report Status PENDING  Incomplete    Medical History: Past Medical History  Diagnosis Date  . Asthma   . COPD (chronic obstructive pulmonary disease)   . Gastric ulcer   . Melanoma of nose   . Hypercholesterolemia   . CHF (congestive heart failure)   . Pulmonary embolism     "when I had my gallbladder  taken out"  . On home oxygen therapy     "2L ususally at night time" (09/08/2014)  . Chronic bronchitis     "get it q yr"  . Type II diabetes mellitus   . History of blood transfusion ?2015    "cause I was throwing it up"  . GERD (gastroesophageal reflux disease)   . History of hiatal hernia   . Grand mal seizure     "last one was in the 1970's" (09/08/2014)  . Arthritis     "joints" (09/08/2014)  . Fibromyalgia   . Anxiety   . Depression     Medications:  Scheduled:  . ALPRAZolam  0.5 mg Oral TID  . docusate sodium  100 mg Oral BID  . DULoxetine  60 mg Oral BID  . fludrocortisone  0.1 mg Oral Daily  . insulin aspart  0-9 Units Subcutaneous TID WC  . insulin glargine  40 Units Subcutaneous QHS  . midodrine  5 mg Oral TID  . mometasone-formoterol  2 puff Inhalation BID  . pantoprazole  40 mg Oral Q1200  . polyethylene glycol  17 g Oral Daily  . potassium chloride SA  20 mEq Oral Daily  . rosuvastatin  40 mg Oral Daily  . sodium chloride  3 mL Intravenous Q12H  . sulfamethoxazole-trimethoprim  1 tablet Oral Q12H  . topiramate  50 mg Oral BID  . vancomycin  1,250 mg Intravenous Q12H  . vancomycin  1,750 mg  Intravenous Once   Infusions:  . sodium chloride 1,000 mL (09/11/14 1003)   Assessment: CC: Jacqueline Hansen is an 74 y.o. female admitted on 09/07/2014 presenting with urinary retention, dark colored stools, and was initiated on levaquin and metronidazole for colitis.   Cultures from [place] resulted in [place] and staph epi.  Pharmacy has been consulted to dose Vancomycin in the setting of bacteremia.    WBC wnl improved, Scr stable, afeb.  UA neg. From [place]  7/3 Bld x 2 [facility]) >> Staph Epi & Acinetobacter (S to Vanc and Bactrim) 7/3 Bld x 2 >> NGTD  7/7 Bactrim >>  7/7 Vanc >>  Goal of Therapy:  Vancomycin trough level 15-20 mcg/ml  Plan:  - Vancomycin 1750 mg IV x 1 - Vancomycin 1250 mg IV Q12H - Monitor for clinical efficacy, cultures, and troughs when  appropriate - Monitor renal function  [name], Pharm. D. Clinical Pharmacy Resident Pager: [phone] Ph: [phone] 09/11/2014 11:24 AM

## 2014-09-11 NOTE — Progress Notes (Addendum)
PATIENT DETAILS Name: Jacqueline Hansen Age: 74 y.o. Sex: female Date of Birth: March 30, 1940 Admit Date: 09/07/2014 Admitting Physician Theressa Millard, MD ZTI:[id number], MD  Subjective: Patient would like to try liquids.   Assessment/Plan:  Staph epidermidis Bacteremia: -I received blood culture report from [facility] where patient had originally presented prior to transfer to [facility]. It appears she had blood cultures done prior to transfer. -Report revealing Staph epi growing from both sets and Acinetobactor from 1 culture. Interestingly blood cultures drawn on presentation here did not show growth of bacteria -I spoke with ID who recommended starting IV Vanc for 14 days along with Bactrim DS. Since blood cultures at this facility were negative with proceed with PICC line placement.  -Will order 2D echo.   Abdominal pain: -Likely secondary to constipation with fecal impaction. Resolved after disimpaction. -Continue bowel regimen with miralax and colace  ?Lower GI bleed: -Possibly secondary to a stecoral ulcer-no melena or hematochezia  -GI consulted, underwent colonoscopy on 7/5 which showed a single polyp in the sigmoid colon, along with diverticulosis in the right colon. It also showed multiple ulcers in the rectum which is felt secondary to fecal impaction.  -Hg remains stable with am labs showing Hg of 8.5  Nausea/Vomiting -Patient reporting multiple episodes of nausea vomiting after eating, unable to hold by mouth down. -Barium Swallow showing severe esophageal dysmotility. Case discussed with Dr Henrene Pastor, it appears this has been a chronic problem. Gi did not recommend further work up during this hospitalization. She should continue follow up her outpatient gastroenterologist.  -Will slowly advance diet to mechanical soft.   Anemia:  -Hg remains stable at 8.3  Street of orthostatic Hypotension:  -Continue Midodrine and Florinef  Acute Urinary  Retention: -Suspect this was secondary to pain. Only catheter placed on admission. Will attempt a voiding trial today.   PAD: -Since suspicion for GI bleed on admission-antiplatelets held. Since hemoglobin stable, no overt bleeding, we will resume Plavix.  COPD: -Stable-lungs are clear. Continue nebs-follow  DM-2: -CBG's stable.Continue Lantus 40 units daily at bedtime and SSI-resume Metformin when able.   Anxiety/Depression:  -Continue Xanax and Cymbalta  Hx of seizure disorder: -Continue Topamax  Disposition: Remain inpatient-home in the next 1-2 days  Antimicrobial agents  See below  Anti-infectives    Start     Dose/Rate Route Frequency Ordered Stop   09/11/14 2330  vancomycin (VANCOCIN) 1,250 mg in sodium chloride 0.9 % 250 mL IVPB     1,250 mg 166.7 mL/hr over 90 Minutes Intravenous Every 12 hours 09/11/14 1110     09/11/14 1130  sulfamethoxazole-trimethoprim (BACTRIM,SEPTRA) 400-80 MG per tablet 1 tablet     1 tablet Oral Every 12 hours 09/11/14 1029     09/11/14 1130  vancomycin (VANCOCIN) 1,750 mg in sodium chloride 0.9 % 500 mL IVPB     1,750 mg 250 mL/hr over 120 Minutes Intravenous  Once 09/11/14 1110     09/07/14 1600  levofloxacin (LEVAQUIN) IVPB 750 mg     750 mg 100 mL/hr over 90 Minutes Intravenous Every 24 hours 09/07/14 1236 09/07/14 1730   09/07/14 0800  metroNIDAZOLE (FLAGYL) IVPB 500 mg  Status:  Discontinued     500 mg 100 mL/hr over 60 Minutes Intravenous 3 times per day 09/07/14 0713 09/08/14 1231   09/07/14 0800  levofloxacin (LEVAQUIN) IVPB 750 mg  Status:  Discontinued     750 mg 100  mL/hr over 90 Minutes Intravenous  Once 09/07/14 0725 09/07/14 1236     DVT Prophylaxis: SCD's  Code Status: Full code  Family Communication None at bedside  Procedures: None  CONSULTS:  GI  Time spent 35 minutes-Greater than 50% of this time was spent in counseling, explanation of diagnosis, planning of further management, and coordination of  care.  MEDICATIONS: Scheduled Meds: . ALPRAZolam  0.5 mg Oral TID  . docusate sodium  100 mg Oral BID  . DULoxetine  60 mg Oral BID  . fludrocortisone  0.1 mg Oral Daily  . insulin aspart  0-9 Units Subcutaneous TID WC  . insulin glargine  40 Units Subcutaneous QHS  . midodrine  5 mg Oral TID  . mometasone-formoterol  2 puff Inhalation BID  . pantoprazole  40 mg Oral Q1200  . polyethylene glycol  17 g Oral Daily  . potassium chloride SA  20 mEq Oral Daily  . rosuvastatin  40 mg Oral Daily  . sodium chloride  3 mL Intravenous Q12H  . sulfamethoxazole-trimethoprim  1 tablet Oral Q12H  . topiramate  50 mg Oral BID  . vancomycin  1,250 mg Intravenous Q12H  . vancomycin  1,750 mg Intravenous Once   Continuous Infusions: . sodium chloride 1,000 mL (09/11/14 1003)   PRN Meds:.acetaminophen **OR** acetaminophen, albuterol, bisacodyl, meclizine, ondansetron **OR** ondansetron (ZOFRAN) IV, oxyCODONE    PHYSICAL EXAM: Vital signs in last 24 hours: Filed Vitals:   09/10/14 1430 09/10/14 1959 09/10/14 2044 09/11/14 0539  BP: 105/57 136/68  122/60  Pulse: 80 87 85 80  Temp: 97.9 F (36.6 C) 97.4 F (36.3 C)  98.1 F (36.7 C)  TempSrc: Oral Oral  Oral  Resp: 22 18 18 18   Height:      Weight:    96.8 kg (213 lb 6.5 oz)  SpO2: 97% 98% 95% 98%    Weight change: -0.497 kg (-1 lb 1.5 oz) Filed Weights   09/08/14 0553 09/10/14 0551 09/11/14 0539  Weight: 92.488 kg (203 lb 14.4 oz) 97.297 kg (214 lb 8 oz) 96.8 kg (213 lb 6.5 oz)   Body mass index is 37.81 kg/(m^2).   Gen Exam: Awake and alert with clear speech.   Neck: Supple, No JVD.   Chest: B/L Clear.   CVS: S1 S2 Regular, no murmurs.  Abdomen: soft, BS +, Non tender Extremities: no edema, lower extremities warm to touch. Neurologic: Non Focal.   Skin: No Rash.   Wounds: N/A.   Intake/Output from previous day:  Intake/Output Summary (Last 24 hours) at 09/11/14 1243 Last data filed at 09/11/14 0600  Gross per 24 hour   Intake   1400 ml  Output    200 ml  Net   1200 ml     LAB RESULTS: CBC  Recent Labs Lab 09/07/14 0903  09/08/14 0534 09/08/14 1910 09/09/14 0540 09/09/14 0917 09/10/14 0246 09/11/14 0415  WBC 29.2*  --  13.7*  --   --  9.4 11.6* 9.5  HGB 9.9*  < > 8.1* 8.1* 7.9* 8.2* 8.5* 8.3*  HCT 30.5*  < > 24.9* 25.0* 24.5* 25.7* 26.3* 25.5*  PLT 267  --  196  --   --  172 198 192  MCV 82.7  --  83.6  --   --  86.0 84.3 83.1  MCH 26.8  --  27.2  --   --  27.4 27.2 27.0  MCHC 32.5  --  32.5  --   --  31.9 32.3  32.5  RDW 13.7  --  14.1  --   --  14.3 14.2 14.0  < > = values in this interval not displayed.  Chemistries   Recent Labs Lab 09/07/14 0903 09/08/14 0534 09/09/14 0917 09/10/14 0246 09/11/14 0415  NA 139 140 141 141 136  K 3.5 3.4* 3.2* 3.7 3.8  CL 108 112* 115* 115* 110  CO2 21* 22 20* 21* 21*  GLUCOSE 222* 123* 93 155* 178*  BUN 8 8 <5* <5* <5*  CREATININE 0.86 0.73 0.59 0.62 0.53  CALCIUM 8.3* 7.9* 7.5* 8.0* 7.9*    CBG:  Recent Labs Lab 09/10/14 1134 09/10/14 1632 09/10/14 2113 09/11/14 0552 09/11/14 1209  GLUCAP 139* 124* 180* 150* 135*    GFR Estimated Creatinine Clearance: 69.4 mL/min (by C-G formula based on Cr of 0.53).  Coagulation profile  Recent Labs Lab 09/07/14 0903  INR 1.24    Cardiac Enzymes No results for input(s): CKMB, TROPONINI, MYOGLOBIN in the last 168 hours.  Invalid input(s): CK  Invalid input(s): POCBNP No results for input(s): DDIMER in the last 72 hours. No results for input(s): HGBA1C in the last 72 hours. No results for input(s): CHOL, HDL, LDLCALC, TRIG, CHOLHDL, LDLDIRECT in the last 72 hours. No results for input(s): TSH, T4TOTAL, T3FREE, THYROIDAB in the last 72 hours.  Invalid input(s): FREET3 No results for input(s): VITAMINB12, FOLATE, FERRITIN, TIBC, IRON, RETICCTPCT in the last 72 hours. No results for input(s): LIPASE, AMYLASE in the last 72 hours.  Urine Studies No results for input(s): UHGB, CRYS  in the last 72 hours.  Invalid input(s): UACOL, UAPR, USPG, UPH, UTP, UGL, UKET, UBIL, UNIT, UROB, ULEU, UEPI, UWBC, URBC, UBAC, CAST, UCOM, BILUA  MICROBIOLOGY: Recent Results (from the past 240 hour(s))  Culture, blood (routine x 2)     Status: None (Preliminary result)   Collection Time: 09/07/14 12:55 PM  Result Value Ref Range Status   Specimen Description BLOOD RIGHT WRIST  Final   Special Requests BOTTLES DRAWN AEROBIC AND ANAEROBIC 10CC  Final   Culture NO GROWTH 3 DAYS  Final   Report Status PENDING  Incomplete  Culture, blood (routine x 2)     Status: None (Preliminary result)   Collection Time: 09/07/14  1:00 PM  Result Value Ref Range Status   Specimen Description BLOOD LEFT HAND  Final   Special Requests BOTTLES DRAWN AEROBIC AND ANAEROBIC 10CC  Final   Culture NO GROWTH 3 DAYS  Final   Report Status PENDING  Incomplete    RADIOLOGY STUDIES/RESULTS: Dg Esophagus  09/11/2014   CLINICAL DATA:  Gastroesophageal reflux disease. Several esophageal dilatations, most recently March 2016. Regurgitation.  EXAM: ESOPHOGRAM/BARIUM SWALLOW  TECHNIQUE: Single contrast examination was performed using  thin barium.  FLUOROSCOPY TIME:  Radiation Exposure Index (as provided by the fluoroscopic device): 330 microGy*m^2  If the device does not provide the exposure index:  Fluoroscopy Time:  dictate in minutes and seconds  Number of Acquired Images:  COMPARISON:  Chest radiograph 09/07/2014  FINDINGS: The patient is not firm and because of this the entire exam was performed with the patient in the LPO position and table tilted about 25 degrees.  Upon initial swallow, there is considerable food stuff material in the esophagus outlined by the barium. Primary peristaltic waves in the esophagus were disrupted in the upper esophagus on 4 out of 4 swallows. There were tertiary and occasional secondary contractions but in general the barium was slow to proceed into the stomach from  the esophagus and only  did 7 8 piecemeal manner. The maximum observe distention of the distal esophagus is 16 mm (image 24, series 5)  I did not administer a barium pill given the degree of dysmotility.  IMPRESSION: 1. 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 Initially there is food material in the esophagus. 2. Please note the patient is infirm and today's entire exam was performed in the LPO position with the table tilted 25 degrees. This helped the barium proceed to the stomach. A normal esophagram includes standing images and images in various positions which the patient was not able to adopt today. Today's exam represents the subset of the standard exam which the patient was capable of. Accordingly there is reduced sensitivity.   Electronically Signed   By: Van Clines M.D.   On: 09/11/2014 08:52   Dg Chest Port 1 View  09/07/2014   CLINICAL DATA:  2-3 day history of shortness of breath. Leukocytosis. Smoker with current history of asthma and diabetes.  EXAM: PORTABLE CHEST - 1 VIEW  COMPARISON:  None.  FINDINGS: Cardiac silhouette normal in size for AP portable technique. Thoracic aorta mildly atherosclerotic. Hilar and mediastinal contours otherwise unremarkable. Suboptimal inspiration. Lungs clear. Bronchovascular markings normal. Pulmonary vascularity normal. No visible pleural effusions. No pneumothorax.  IMPRESSION: Suboptimal inspiration.  No acute cardiopulmonary disease.   Electronically Signed   By: Evangeline Dakin M.D.   On: 09/07/2014 16:42    Kelvin Cellar, MD  [facility] Pager:[phone]  If 7PM-7AM, please contact night-coverage [email or web address] Password [email or web address] 09/11/2014, 12:43 PM   LOS: 4 days

## 2014-09-11 NOTE — Progress Notes (Signed)
At 10am notified Dr. Coralyn Pear of blood cx results received from another facility.  Karie Kirks, Therapist, sports.

## 2014-09-11 NOTE — Progress Notes (Signed)
Physical Therapy Treatment Patient Details Name: Jacqueline Hansen MRN: [id number] DOB: 1941-02-01 Today's Date: 09-28-2014    History of Present Illness Jacqueline Hansen is a 74 y.o. female, medical history of COPD, diabetes mellitus, seizures, PAD, on aspirin and Plavix, resents to that hospital complaining of urinary retention, and dark color stools. Pt s/p disimpaction and colonoscopy with great improvement in symptoms    PT Comments    Pt with increased gait and activity tolerance today but still reports fatigue end of session. Pt educated for HEp and encouraged walking program at home. Pt with baseline limited function and states she is accepting of RW use now. Will continue to follow.   Follow Up Recommendations  No PT follow up     Equipment Recommendations       Recommendations for Other Services       Precautions / Restrictions Precautions Precautions: Fall    Mobility  Bed Mobility Overal bed mobility: Modified Independent             General bed mobility comments: reliance on rail and increased time today  Transfers Overall transfer level: Modified independent               General transfer comment: lack of control of descent to chair  Ambulation/Gait Ambulation/Gait assistance: Supervision Ambulation Distance (Feet): 150 Feet Assistive device: Rolling walker (2 wheeled) Gait Pattern/deviations: Step-through pattern;Decreased stride length;Trunk flexed   Gait velocity interpretation: Below normal speed for age/gender General Gait Details: cues for posture and position in RW   Stairs            Wheelchair Mobility    Modified Rankin (Stroke Patients Only)       Balance                                    Cognition Arousal/Alertness: Awake/alert Behavior During Therapy: WFL for tasks assessed/performed Overall Cognitive Status: Within Functional Limits for tasks assessed                      Exercises General  Exercises - Lower Extremity Long Arc Quad: AROM;Seated;Both;20 reps Hip ABduction/ADduction: AROM;Seated;Both;20 reps Hip Flexion/Marching: AROM;Seated;Both;20 reps Toe Raises: AROM;Seated;Both;20 reps Heel Raises: AROM;Seated;Both;20 reps    General Comments        Pertinent Vitals/Pain Pain Assessment: No/denies pain    Home Living                      Prior Function            PT Goals (current goals can now be found in the care plan section) Progress towards PT goals: Progressing toward goals    Frequency       PT Plan Current plan remains appropriate    Co-evaluation             End of Session   Activity Tolerance: Patient tolerated treatment well Patient left: in chair;with call bell/phone within reach     Time: 1136-1155 PT Time Calculation (min) (ACUTE ONLY): 19 min  Charges:  $Gait Training: 8-22 mins                    G Codes:      Melford Aase September 28, 2014, 12:23 PM Elwyn Reach, Portal

## 2014-09-11 NOTE — Progress Notes (Signed)
  Echocardiogram 2D Echocardiogram with Definity has been performed.  Jacqueline Hansen 09/11/2014, 4:53 PM

## 2014-09-11 NOTE — Progress Notes (Signed)
Peripherally Inserted Central Catheter/Midline Placement  The IV Nurse has discussed with the patient and/or persons authorized to consent for the patient, the purpose of this procedure and the potential benefits and risks involved with this procedure.  The benefits include less needle sticks, lab draws from the catheter and patient may be discharged home with the catheter.  Risks include, but not limited to, infection, bleeding, blood clot (thrombus formation), and puncture of an artery; nerve damage and irregular heat beat.  Alternatives to this procedure were also discussed.  PICC/Midline Placement Documentation        Jacqueline Hansen 09/11/2014, 3:05 PM

## 2014-09-12 DIAGNOSIS — K224 Dyskinesia of esophagus: Secondary | ICD-10-CM

## 2014-09-12 LAB — CULTURE, BLOOD (ROUTINE X 2)
CULTURE: NO GROWTH
Culture: NO GROWTH

## 2014-09-12 LAB — GLUCOSE, CAPILLARY
Glucose-Capillary: 106 mg/dL — ABNORMAL HIGH (ref 65–99)
Glucose-Capillary: 137 mg/dL — ABNORMAL HIGH (ref 65–99)

## 2014-09-12 LAB — VANCOMYCIN, TROUGH: VANCOMYCIN TR: 17 ug/mL (ref 10.0–20.0)

## 2014-09-12 MED ORDER — VANCOMYCIN HCL 10 G IV SOLR: 1250.0000 mg | Freq: Two times a day (BID) | INTRAVENOUS | Status: DC

## 2014-09-12 MED ORDER — POLYETHYLENE GLYCOL 3350 17 G PO PACK: 17.0000 g | PACK | Freq: Every day | ORAL | Status: DC

## 2014-09-12 MED ORDER — SULFAMETHOXAZOLE-TRIMETHOPRIM 800-160 MG PO TABS: 1.0000 | ORAL_TABLET | Freq: Two times a day (BID) | ORAL | Status: DC

## 2014-09-12 MED ORDER — HEPARIN SOD (PORK) LOCK FLUSH 100 UNIT/ML IV SOLN
250.0000 [IU] | INTRAVENOUS | Status: AC | PRN
  Administered 2014-09-12: 16:00:00

## 2014-09-12 MED ORDER — DOCUSATE SODIUM 100 MG PO CAPS: 100.0000 mg | ORAL_CAPSULE | Freq: Two times a day (BID) | ORAL | Status: DC

## 2014-09-12 MED ORDER — INSULIN GLARGINE 100 UNIT/ML ~~LOC~~ SOLN: 50.0000 [IU] | Freq: Every day | SUBCUTANEOUS | Status: DC

## 2014-09-12 NOTE — Progress Notes (Signed)
SATURATION QUALIFICATIONS: (This note is used to comply with regulatory documentation for home oxygen)  Patient Saturations on Room Air at Rest = 80%  Patient Saturations on Room Air while Ambulating = 73%  Patient Saturations on 2 Liters of oxygen while Ambulating = 100%  Please briefly explain why patient needs home oxygen:

## 2014-09-12 NOTE — Progress Notes (Addendum)
ANTIBIOTIC CONSULT NOTE - FOLLOW UP  Pharmacy Consult for Vancomycin Indication: Bacteremia  No Known Allergies  Patient Measurements: Height: 5\' 3"  (160 cm) Weight: 215 lb 9.6 oz (97.796 kg) (b scale) IBW/kg (Calculated) : 52.4  Vital Signs: Temp: 98.6 F (37 C) (07/08 0534) Temp Source: Oral (07/08 0534) BP: 120/43 mmHg (07/08 1342) Pulse Rate: 80 (07/08 1342) Intake/Output from previous day: 07/07 0701 - 07/08 0700 In: 2505 [P.O.:120; I.V.:825; IV Piggyback:250] Out: 975 [Urine:975] Intake/Output from this shift: Total I/O In: 240 [P.O.:240] Out: -   Labs:  Recent Labs  09/10/14 0246 09/11/14 0415  WBC 11.6* 9.5  HGB 8.5* 8.3*  PLT 198 192  CREATININE 0.62 0.53   Estimated Creatinine Clearance: 69.8 mL/min (by C-G formula based on Cr of 0.53).  Recent Labs  09/12/14 1200  [place]     Microbiology: Recent Results (from the past 720 hour(s))  Culture, blood (routine x 2)     Status: None (Preliminary result)   Collection Time: 09/07/14 12:55 PM  Result Value Ref Range Status   Specimen Description BLOOD RIGHT WRIST  Final   Special Requests BOTTLES DRAWN AEROBIC AND ANAEROBIC 10CC  Final   Culture NO GROWTH 4 DAYS  Final   Report Status PENDING  Incomplete  Culture, blood (routine x 2)     Status: None (Preliminary result)   Collection Time: 09/07/14  1:00 PM  Result Value Ref Range Status   Specimen Description BLOOD LEFT HAND  Final   Special Requests BOTTLES DRAWN AEROBIC AND ANAEROBIC 10CC  Final   Culture NO GROWTH 4 DAYS  Final   Report Status PENDING  Incomplete    Medical History: Past Medical History  Diagnosis Date  . Asthma   . COPD (chronic obstructive pulmonary disease)   . Gastric ulcer   . Melanoma of nose   . Hypercholesterolemia   . CHF (congestive heart failure)   . Pulmonary embolism     "when I had my gallbladder taken out"  . On home oxygen therapy     "2L ususally at night time" (09/08/2014)  . Chronic bronchitis      "get it q yr"  . Type II diabetes mellitus   . History of blood transfusion ?2015    "cause I was throwing it up"  . GERD (gastroesophageal reflux disease)   . History of hiatal hernia   . Grand mal seizure     "last one was in the 1970's" (09/08/2014)  . Arthritis     "joints" (09/08/2014)  . Fibromyalgia   . Anxiety   . Depression     Medications:  Scheduled:  . ALPRAZolam  0.5 mg Oral TID  . docusate sodium  100 mg Oral BID  . DULoxetine  60 mg Oral BID  . fludrocortisone  0.1 mg Oral Daily  . heparin subcutaneous  5,000 Units Subcutaneous Q12H  . insulin aspart  0-9 Units Subcutaneous TID WC  . insulin glargine  40 Units Subcutaneous QHS  . midodrine  5 mg Oral TID  . mometasone-formoterol  2 puff Inhalation BID  . pantoprazole  40 mg Oral Q1200  . polyethylene glycol  17 g Oral Daily  . potassium chloride SA  20 mEq Oral Daily  . rosuvastatin  40 mg Oral Daily  . sodium chloride  3 mL Intravenous Q12H  . sulfamethoxazole-trimethoprim  1 tablet Oral Q12H  . topiramate  50 mg Oral BID  . vancomycin  1,250 mg Intravenous Q12H  Infusions:  . sodium chloride 75 mL/hr at 09/12/14 0936   Assessment: CC: Jacqueline Hansen is an 74 y.o. female admitted on 09/07/2014 presenting with urinary retention, dark colored stools, and was initiated on levaquin and metronidazole for colitis.   Cultures from [place] resulted in [place] and staph epi.  Pharmacy has been consulted to dose Vancomycin in the setting of bacteremia.    WBC wnl improved, Scr stable, afeb.  UA neg. From [place]  7/8 VT 17 (Drawn prior to 3rd dose, not likely at steady state)  7/3 Bld x 2 [facility]) >> Staph Epi & Acinetobacter (S to Vanc and Bactrim) 7/3 Bld x 2 >> NGTD  7/7 Bactrim >>  7/7 Vanc >>  Goal of Therapy:  Vancomycin trough level 15-20 mcg/ml  Plan:  - Patient to be discharged on Vancomycin 1250 mg IV Q12H.  However, concerning the vancomycin trough is 17 after only 2 doses, I anticipate  this will be much higher once the patient achieves steady state.  I would recommend changing to Vancomycin 1750 mg IV Q24H for ease of administration (home health).  Patient to undergo 14 days of antibiotic therapy, ending 7/20.   - Continue Bactrim 400-80 mg PO Q12H - Monitor for clinical efficacy, cultures, and troughs when appropriate - Monitor renal function  [name], Pharm. D. Clinical Pharmacy Resident Pager: [phone] Ph: [phone] 09/12/2014 1:59 PM

## 2014-09-12 NOTE — Discharge Summary (Signed)
Physician Discharge Summary  Jacqueline Hansen XIH:[id number] DOB: 1941-01-17 DOA: 09/07/2014  PCP: Keane Police, MD  Admit date: 09/07/2014 Discharge date: 09/12/2014  Time spent: 35 minutes  Recommendations for Outpatient Follow-up:  1. Please follow-up on Vancomycin trough prior to 4th dose 2. She was discharged on Vancomycin 1,250 mg IV BID for 2 weeks, anticipated stop date 09/26/2014, for positive blood cultures growing Staph epi. Repeat blood cultures remain negative. 3. Prior to discharge she was set up with home health services for physical therapy, occupational therapy, RN 4. BMP and CBC in 3-4 days  Discharge Diagnoses:  Active Problems:   GI bleed   Colitis   COPD (chronic obstructive pulmonary disease)   DM (diabetes mellitus)   PAD (peripheral artery disease)   Urinary retention   Benign neoplasm of sigmoid colon   Rectal ulceration   Discharge Condition: Stable  Diet recommendation: Healthy  Filed Weights   09/10/14 0551 09/11/14 0539 09/12/14 0236  Weight: 97.297 kg (214 lb 8 oz) 96.8 kg (213 lb 6.5 oz) 97.796 kg (215 lb 9.6 oz)    History of present illness:  Jacqueline Hansen is a 74 y.o. female, medical history of COPD, diabetes mellitus, seizures, PAD, on aspirin and Plavix, resents to that hospital complaining of urinary retention, and dark color stools, and [place] workup was significant for hemoglobin of 10(unclear what is her baseline), was no significant labs abnormalities, shunt was found to have urinary retention, where she required in and out in [place] in ED, due to lack of GI coverage this weekend at [facility], patient was transferred to [facility], patient report is been having dark-colored stool for the last 2 days, denies any coffee-ground emesis, any bright red blood per rectum, a shunt was started on Protonix drip, and reports she had endoscopy many years ago, can't even remember when, reports she does not recall any abnormal findings, there is a  history of gastric ulcer . CT abdomen and pelvis done on [facility] hospital was significant for rectosigmoid colitis , started on levofloxacin and Flagyl.  Hospital Course:   Positive Blood Cultures;Staph epidermidis Bacteremia -I received blood culture report from [facility] where patient had originally presented prior to transfer to [facility]. It appears she had blood cultures done prior to transfer. -Report revealing Staph epi growing from both sets and Acinetobactor from 1 culture. Interestingly blood cultures drawn on presentation here did not show growth of bacteria -I spoke with ID who recommended starting IV Vanc for 14 days along with Bactrim DS. Since blood cultures at this facility were negative with proceed with PICC line placement.  -Transthoracic echocardiogram performed on 09/11/2014 showing ejection fraction of 55-60% -She was discharged on a 2 week course of Vancomycin with home health services following. Anticipated stop date September 26, 2014 -She was discharged on 10 days of Bactrim for treatment of Acinetobactor -Blood cultures from 09/07/2014 remain negative.   Abdominal pain: -Likely secondary to constipation with fecal impaction. Resolved after disimpaction. -She was discharged on Miralax and Colace  Lower GI bleed: -Possibly secondary to a stecoral ulcer-no melena or hematochezia  -GI consulted, underwent colonoscopy on 7/5 which showed a single polyp in the sigmoid colon, along with diverticulosis in the right colon. It also showed multiple ulcers in the rectum which is felt secondary to fecal impaction.  -Hg remains stable with am labs showing Hg of 8.5  Nausea/Vomiting Secondary to Esophageal Dysmotility -Patient having a long history of intermittent nausea/vomiting mainly associated with eating  -Barium Swallow  showing severe esophageal dysmotility. Case discussed with Dr Henrene Pastor, it appears this has been a chronic problem. Gi did not recommend further work up  during this hospitalization. She should continue follow up her outpatient gastroenterologist.  -She tolerated soft diet   Consultations:  Telephone Consult made to ID\  GI  Discharge Exam: Filed Vitals:   09/12/14 1342  BP: 120/43  Pulse: 80  Temp:   Resp:     Gen Exam: Awake and alert with clear speech, ambulated down the hallway  Neck: Supple, No JVD.  Chest: B/L Clear.  CVS: S1 S2 Regular, no murmurs.  Abdomen: soft, BS +, Non tender Extremities: no edema, lower extremities warm to touch. Neurologic: Non Focal.  Skin: No Rash.  Wounds: N/A.   Discharge Instructions   Discharge Instructions    Call MD for:  difficulty breathing, headache or visual disturbances    Complete by:  As directed      Call MD for:  difficulty breathing, headache or visual disturbances    Complete by:  As directed      Call MD for:  extreme fatigue    Complete by:  As directed      Call MD for:  extreme fatigue    Complete by:  As directed      Call MD for:  hives    Complete by:  As directed      Call MD for:  hives    Complete by:  As directed      Call MD for:  persistant dizziness or light-headedness    Complete by:  As directed      Call MD for:  persistant dizziness or light-headedness    Complete by:  As directed      Call MD for:  persistant nausea and vomiting    Complete by:  As directed      Call MD for:  persistant nausea and vomiting    Complete by:  As directed      Call MD for:  redness, tenderness, or signs of infection (pain, swelling, redness, odor or green/yellow discharge around incision site)    Complete by:  As directed      Call MD for:  redness, tenderness, or signs of infection (pain, swelling, redness, odor or green/yellow discharge around incision site)    Complete by:  As directed      Call MD for:  severe uncontrolled pain    Complete by:  As directed      Call MD for:  severe uncontrolled pain    Complete by:  As directed      Call MD for:   temperature >100.4    Complete by:  As directed      Call MD for:  temperature >100.4    Complete by:  As directed      Call MD for:    Complete by:  As directed      Call MD for:    Complete by:  As directed      Diet - low sodium heart healthy    Complete by:  As directed      Diet - low sodium heart healthy    Complete by:  As directed      Increase activity slowly    Complete by:  As directed      Increase activity slowly    Complete by:  As directed           Current Discharge Medication List  START taking these medications   Details  bisacodyl (DULCOLAX) 5 MG EC tablet Take 1 tablet (5 mg total) by mouth daily as needed for moderate constipation. Qty: 30 tablet, Refills: 0    !! docusate sodium (COLACE) 100 MG capsule Take 1 capsule (100 mg total) by mouth 2 (two) times daily. Qty: 10 capsule, Refills: 0    !! docusate sodium (COLACE) 100 MG capsule Take 1 capsule (100 mg total) by mouth 2 (two) times daily. Qty: 10 capsule, Refills: 0    insulin glargine (LANTUS) 100 UNIT/ML injection Inject 0.5 mLs (50 Units total) into the skin at bedtime. Qty: 10 mL, Refills: 11    polyethylene glycol (MIRALAX / GLYCOLAX) packet Take 17 g by mouth daily. Qty: 14 each, Refills: 0    polyethylene glycol powder (MIRALAX) powder Take 255 g by mouth once. Qty: 255 g, Refills: 0    sulfamethoxazole-trimethoprim (BACTRIM DS,SEPTRA DS) 800-160 MG per tablet Take 1 tablet by mouth 2 (two) times daily. Qty: 20 tablet, Refills: 0    vancomycin 1,250 mg in sodium chloride 0.9 % 250 mL Inject 1,250 mg into the vein every 12 (twelve) hours. Qty: 1 mg, Refills: 0     !! - Potential duplicate medications found. Please discuss with provider.    CONTINUE these medications which have NOT CHANGED   Details  ADVAIR DISKUS 250-50 MCG/DOSE AEPB Inhale 1 puff into the lungs 2 (two) times daily.    ALPRAZolam (XANAX) 0.5 MG tablet Take 0.5 mg by mouth 3 (three) times daily.    CRESTOR 40 MG  tablet Take 40 mg by mouth daily.    DULoxetine (CYMBALTA) 60 MG capsule Take 60 mg by mouth 2 (two) times daily.    fludrocortisone (FLORINEF) 0.1 MG tablet Take 0.1 mg by mouth daily.    fluticasone (FLONASE) 50 MCG/ACT nasal spray Place 2 sprays into both nostrils daily.    HYDROcodone-acetaminophen (NORCO/VICODIN) 5-325 MG per tablet Take 1-2 tablets by mouth every 6 (six) hours as needed for moderate pain. Qty: 14 tablet, Refills: 0    meclizine (ANTIVERT) 12.5 MG tablet Take 12.5 mg by mouth 3 (three) times daily as needed. vertigo    metFORMIN (GLUCOPHAGE) 500 MG tablet Take 500 mg by mouth 2 (two) times daily.    metoCLOPramide (REGLAN) 5 MG tablet Take 5 mg by mouth 4 (four) times daily -  before meals and at bedtime.     midodrine (PROAMATINE) 5 MG tablet Take 5 mg by mouth 3 (three) times daily.    NEXIUM 40 MG capsule Take 40 mg by mouth daily.    potassium chloride SA (K-DUR,KLOR-CON) 20 MEQ tablet Take 20 mEq by mouth daily.    PROAIR HFA 108 (90 BASE) MCG/ACT inhaler Inhale 2 puffs into the lungs every 6 (six) hours as needed. Shortness of breath    topiramate (TOPAMAX) 50 MG tablet Take 50 mg by mouth 2 (two) times daily.    zolpidem (AMBIEN) 10 MG tablet Take 10 mg by mouth at bedtime as needed for sleep.       STOP taking these medications     clopidogrel (PLAVIX) 75 MG tablet      furosemide (LASIX) 40 MG tablet      LANTUS SOLOSTAR 100 UNIT/ML Solostar Pen      losartan (COZAAR) 25 MG tablet        No Known Allergies Follow-up Information    Follow up with Keane Police, MD On 09/24/2014.   Specialty:  Family Medicine  Why:  Appt. @ 3;30pm   Contact information:   [address] [place] [address] [phone]        The results of significant diagnostics from this hospitalization (including imaging, microbiology, ancillary and laboratory) are listed below for reference.    Significant Diagnostic Studies: Dg  Esophagus  09/11/2014   CLINICAL DATA:  Gastroesophageal reflux disease. Several esophageal dilatations, most recently March 2016. Regurgitation.  EXAM: ESOPHOGRAM/BARIUM SWALLOW  TECHNIQUE: Single contrast examination was performed using  thin barium.  FLUOROSCOPY TIME:  Radiation Exposure Index (as provided by the fluoroscopic device): 330 microGy*m^2  If the device does not provide the exposure index:  Fluoroscopy Time:  dictate in minutes and seconds  Number of Acquired Images:  COMPARISON:  Chest radiograph 09/07/2014  FINDINGS: The patient is not firm and because of this the entire exam was performed with the patient in the LPO position and table tilted about 25 degrees.  Upon initial swallow, there is considerable food stuff material in the esophagus outlined by the barium. Primary peristaltic waves in the esophagus were disrupted in the upper esophagus on 4 out of 4 swallows. There were tertiary and occasional secondary contractions but in general the barium was slow to proceed into the stomach from the esophagus and only did 7 8 piecemeal manner. The maximum observe distention of the distal esophagus is 16 mm (image 24, series 5)  I did not administer a barium pill given the degree of dysmotility.  IMPRESSION: 1. 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 Initially there is food material in the esophagus. 2. Please note the patient is infirm and today's entire exam was performed in the LPO position with the table tilted 25 degrees. This helped the barium proceed to the stomach. A normal esophagram includes standing images and images in various positions which the patient was not able to adopt today. Today's exam represents the subset of the standard exam which the patient was capable of. Accordingly there is reduced sensitivity.   Electronically Signed   By: Van Clines M.D.   On: 09/11/2014 08:52   Dg Chest Port 1 View  09/07/2014   CLINICAL DATA:  2-3 day history of shortness of breath.  Leukocytosis. Smoker with current history of asthma and diabetes.  EXAM: PORTABLE CHEST - 1 VIEW  COMPARISON:  None.  FINDINGS: Cardiac silhouette normal in size for AP portable technique. Thoracic aorta mildly atherosclerotic. Hilar and mediastinal contours otherwise unremarkable. Suboptimal inspiration. Lungs clear. Bronchovascular markings normal. Pulmonary vascularity normal. No visible pleural effusions. No pneumothorax.  IMPRESSION: Suboptimal inspiration.  No acute cardiopulmonary disease.   Electronically Signed   By: Evangeline Dakin M.D.   On: 09/07/2014 16:42    Microbiology: Recent Results (from the past 240 hour(s))  Culture, blood (routine x 2)     Status: None (Preliminary result)   Collection Time: 09/07/14 12:55 PM  Result Value Ref Range Status   Specimen Description BLOOD RIGHT WRIST  Final   Special Requests BOTTLES DRAWN AEROBIC AND ANAEROBIC 10CC  Final   Culture NO GROWTH 4 DAYS  Final   Report Status PENDING  Incomplete  Culture, blood (routine x 2)     Status: None (Preliminary result)   Collection Time: 09/07/14  1:00 PM  Result Value Ref Range Status   Specimen Description BLOOD LEFT HAND  Final   Special Requests BOTTLES DRAWN AEROBIC AND ANAEROBIC 10CC  Final   Culture NO GROWTH 4 DAYS  Final   Report Status PENDING  Incomplete     Labs: Basic Metabolic Panel:  Recent Labs Lab 09/07/14 0903 09/08/14 0534 09/09/14 0917 09/10/14 0246 09/11/14 0415  NA 139 140 141 141 136  K 3.5 3.4* 3.2* 3.7 3.8  CL 108 112* 115* 115* 110  CO2 21* 22 20* 21* 21*  GLUCOSE 222* 123* 93 155* 178*  BUN 8 8 <5* <5* <5*  CREATININE 0.86 0.73 0.59 0.62 0.53  CALCIUM 8.3* 7.9* 7.5* 8.0* 7.9*   Liver Function Tests:  Recent Labs Lab 09/07/14 0903  AST 18  ALT 10*  ALKPHOS 69  BILITOT 0.5  PROT 5.6*  ALBUMIN 2.7*   No results for input(s): LIPASE, AMYLASE in the last 168 hours. No results for input(s): AMMONIA in the last 168 hours. CBC:  Recent Labs Lab  09/07/14 0903  09/08/14 0534 09/08/14 1910 09/09/14 0540 09/09/14 0917 09/10/14 0246 09/11/14 0415  WBC 29.2*  --  13.7*  --   --  9.4 11.6* 9.5  HGB 9.9*  < > 8.1* 8.1* 7.9* 8.2* 8.5* 8.3*  HCT 30.5*  < > 24.9* 25.0* 24.5* 25.7* 26.3* 25.5*  MCV 82.7  --  83.6  --   --  86.0 84.3 83.1  PLT 267  --  196  --   --  172 198 192  < > = values in this interval not displayed. Cardiac Enzymes: No results for input(s): CKTOTAL, CKMB, CKMBINDEX, TROPONINI in the last 168 hours. BNP: BNP (last 3 results) No results for input(s): BNP in the last 8760 hours.  ProBNP (last 3 results) No results for input(s): PROBNP in the last 8760 hours.  CBG:  Recent Labs Lab 09/11/14 1209 09/11/14 1705 09/11/14 2145 09/12/14 0627 09/12/14 1131  GLUCAP 135* 162* 163* 106* 137*       Signed:  Hai Grabe  [facility] 09/12/2014, 2:21 PM

## 2014-09-12 NOTE — Progress Notes (Signed)
Patient states that she has oxygen at home but does not have a portable tank for transport home. [facility] called for portable 02 tank to be delivered to room prior to discharge home; Aneta Mins [phone]

## 2014-09-12 NOTE — Progress Notes (Signed)
[facility]  Patient Status:New pt for [facility] this admission    [facility] is providing the following services: HHRN and home infusion pharmacy team for home IVABX.  Pt lives in [place], [place], but will DC home with daughter Jeannene Patella to her home in [place].  Discussed POC for home IV ABX with daughter by phone and she feels she can support learning to administer IV ABX at home.    If patient discharges after hours, please call [phone].   Larry Sierras 09/12/2014, 11:21 AM

## 2014-09-12 NOTE — Progress Notes (Signed)
At 1713 pt receid all d/c instructions, verbalized understanding.   D/c to awaiting transport to home by escorted by her daughter.  Karie Kirks, Therapist, sports.

## 2014-09-12 NOTE — Care Management Note (Signed)
Case Management Note  Patient Details  Name: Jacqueline Hansen MRN: [id number] Date of Birth: 1940-05-06  Subjective/Objective:      Admitted with ASD              Action/Plan: Patient lives at home with daughter, has home 36 with [employer]( DME supply store in [place]). [place] choices offered for home IV antibiotic- patient chose [facility]. Pam with [facility] made aware and is to see patient today; CM informed patient to call her daughter to tell her to bring her oxygen from home for discharge. Aneta Mins [phone]  Expected Discharge Date:   09/12/2014               Expected Discharge Plan:  [place]  Discharge planning Services  CM Consult   Choice offered to:  Patient  HH Arranged:  RN [facility] Agency:  [place]  Status of Service:  In process, will continue to follow  Medicare Important Message Given:  Yes-second notification given  Royston Bake, RN,MHA BSN [phone] 09/12/2014, 10:35 AM

## 2014-09-12 NOTE — Progress Notes (Signed)
Physical Therapy Treatment Patient Details Name: Jacqueline Hansen MRN: [id number] DOB: 04/13/1940 Today's Date: 09/12/2014    History of Present Illness Jacqueline Hansen is a 74 y.o. female, medical history of COPD, diabetes mellitus, seizures, PAD, on aspirin and Plavix, resents to that hospital complaining of urinary retention, and dark color stools. Pt s/p disimpaction and colonoscopy with great improvement in symptoms    PT Comments    Pt with continued progression of gait today with education for walking program and HEP along with RW use all to be continued at home with pt verbalizing understanding. Pt with incontinence of urine on arrival and required assist for pericare and linen change as she utilizes long handled sponge at home. Will continue to follow.   Follow Up Recommendations  No PT follow up     Equipment Recommendations       Recommendations for Other Services       Precautions / Restrictions Precautions Precautions: Fall Restrictions Weight Bearing Restrictions: No    Mobility  Bed Mobility Overal bed mobility: Modified Independent             General bed mobility comments: reliance on rail and increased time today  Transfers Overall transfer level: Modified independent               General transfer comment: lack of control of descent to chair with transfers from bed and [employer]  Ambulation/Gait Ambulation/Gait assistance: Supervision Ambulation Distance (Feet): 175 Feet Assistive device: Rolling walker (2 wheeled) Gait Pattern/deviations: Step-through pattern;Decreased stride length;Trunk flexed   Gait velocity interpretation: Below normal speed for age/gender General Gait Details: cues for posture and position in RW   Stairs            Wheelchair Mobility    Modified Rankin (Stroke Patients Only)       Balance Overall balance assessment: Needs assistance   Sitting balance-Leahy Scale: Good       Standing balance-Leahy Scale: Poor                      Cognition Arousal/Alertness: Awake/alert Behavior During Therapy: WFL for tasks assessed/performed Overall Cognitive Status: Within Functional Limits for tasks assessed                      Exercises General Exercises - Lower Extremity Long Arc Quad: AROM;Seated;Both;20 reps Hip ABduction/ADduction: AROM;Seated;Both;20 reps Hip Flexion/Marching: AROM;Seated;Both;20 reps Toe Raises: AROM;Seated;Both;20 reps Heel Raises: AROM;Seated;Both;20 reps    General Comments        Pertinent Vitals/Pain Pain Assessment: No/denies pain    Home Living                      Prior Function            PT Goals (current goals can now be found in the care plan section) Progress towards PT goals: Progressing toward goals    Frequency       PT Plan Current plan remains appropriate    Co-evaluation             End of Session   Activity Tolerance: Patient tolerated treatment well Patient left: in chair;with call bell/phone within reach     Time: 0821-0848 PT Time Calculation (min) (ACUTE ONLY): 27 min  Charges:  $Gait Training: 8-22 mins $Therapeutic Activity: 8-22 mins                    G Codes:  Jacqueline Hansen [name] 09/12/2014, 9:25 AM Elwyn Reach, [place]

## 2014-09-15 ENCOUNTER — Other Ambulatory Visit (HOSPITAL_COMMUNITY)
Admission: AD | Admit: 2014-09-15 | Discharge: 2014-09-15 | Disposition: A | Discharge status: Home / Self Care | Payer: Medicare Other | Source: Other Acute Inpatient Hospital | Attending: Family Medicine | Admitting: Family Medicine

## 2014-09-15 DIAGNOSIS — R7881 Bacteremia: Secondary | ICD-10-CM | POA: Insufficient documentation

## 2014-09-15 LAB — CBC WITH DIFFERENTIAL/PLATELET
BASOS ABS: 0 10*3/uL (ref 0.0–0.1)
BASOS PCT: 0 % (ref 0–1)
EOS ABS: 0.5 10*3/uL (ref 0.0–0.7)
Eosinophils Relative: 8 % — ABNORMAL HIGH (ref 0–5)
HCT: 25.7 % — ABNORMAL LOW (ref 36.0–46.0)
Hemoglobin: 8.2 g/dL — ABNORMAL LOW (ref 12.0–15.0)
LYMPHS ABS: 1.3 10*3/uL (ref 0.7–4.0)
LYMPHS PCT: 22 % (ref 12–46)
MCH: 27 pg (ref 26.0–34.0)
MCHC: 31.9 g/dL (ref 30.0–36.0)
MCV: 84.5 fL (ref 78.0–100.0)
MONOS PCT: 7 % (ref 3–12)
Monocytes Absolute: 0.4 10*3/uL (ref 0.1–1.0)
NEUTROS ABS: 3.8 10*3/uL (ref 1.7–7.7)
Neutrophils Relative %: 63 % (ref 43–77)
Platelets: 195 10*3/uL (ref 150–400)
RBC: 3.04 MIL/uL — AB (ref 3.87–5.11)
RDW: 14 % (ref 11.5–15.5)
WBC: 6 10*3/uL (ref 4.0–10.5)

## 2014-09-15 LAB — CREATININE, SERUM
Creatinine, Ser: 0.52 mg/dL (ref 0.44–1.00)
GFR calc Af Amer: >60 mL/min (ref >60)

## 2014-09-15 LAB — VANCOMYCIN, TROUGH: Vancomycin Tr: 21 ug/mL — ABNORMAL HIGH (ref 10.0–20.0)

## 2014-09-15 LAB — BUN: BUN: 6 mg/dL (ref 6–20)

## 2014-09-22 ENCOUNTER — Other Ambulatory Visit (HOSPITAL_COMMUNITY)
Admission: RE | Admit: 2014-09-22 | Discharge: 2014-09-22 | Disposition: A | Discharge status: Home / Self Care | Payer: Medicare Other | Source: Other Acute Inpatient Hospital | Attending: Family Medicine | Admitting: Family Medicine

## 2014-09-22 DIAGNOSIS — R7881 Bacteremia: Secondary | ICD-10-CM | POA: Insufficient documentation

## 2014-09-22 DIAGNOSIS — Z5181 Encounter for therapeutic drug level monitoring: Secondary | ICD-10-CM | POA: Diagnosis present

## 2014-09-22 LAB — CBC WITH DIFFERENTIAL/PLATELET
Basophils Absolute: 0 10*3/uL (ref 0.0–0.1)
Basophils Relative: 1 % (ref 0–1)
EOS ABS: 0.4 10*3/uL (ref 0.0–0.7)
Eosinophils Relative: 7 % — ABNORMAL HIGH (ref 0–5)
HEMATOCRIT: 19.4 % — AB (ref 36.0–46.0)
HEMOGLOBIN: 5.9 g/dL — AB (ref 12.0–15.0)
LYMPHS ABS: 1.7 10*3/uL (ref 0.7–4.0)
LYMPHS PCT: 29 % (ref 12–46)
MCH: 25.7 pg — ABNORMAL LOW (ref 26.0–34.0)
MCHC: 30.4 g/dL (ref 30.0–36.0)
MCV: 84.3 fL (ref 78.0–100.0)
MONO ABS: 0.4 10*3/uL (ref 0.1–1.0)
Monocytes Relative: 7 % (ref 3–12)
NEUTROS ABS: 3.4 10*3/uL (ref 1.7–7.7)
Neutrophils Relative %: 57 % (ref 43–77)
PLATELETS: 230 10*3/uL (ref 150–400)
RBC: 2.3 MIL/uL — ABNORMAL LOW (ref 3.87–5.11)
RDW: 14.7 % (ref 11.5–15.5)
WBC: 5.9 10*3/uL (ref 4.0–10.5)

## 2014-09-22 LAB — CREATININE, SERUM
Creatinine, Ser: 0.53 mg/dL (ref 0.44–1.00)
GFR calc Af Amer: >60 mL/min (ref >60)

## 2014-09-22 LAB — BUN: BUN: 5 mg/dL — ABNORMAL LOW (ref 6–20)

## 2014-09-22 LAB — VANCOMYCIN, TROUGH: Vancomycin Tr: 13 ug/mL (ref 10.0–20.0)

## 2016-08-06 ENCOUNTER — Inpatient Hospital Stay (HOSPITAL_COMMUNITY)
Admission: EM | Admit: 2016-08-06 | Discharge: 2016-08-11 | DRG: 580 | Disposition: A | Discharge status: Home Health | Payer: Medicare HMO | Attending: Internal Medicine | Admitting: Internal Medicine

## 2016-08-06 ENCOUNTER — Emergency Department (HOSPITAL_COMMUNITY): Payer: Medicare HMO

## 2016-08-06 ENCOUNTER — Encounter (HOSPITAL_COMMUNITY): Payer: Self-pay | Admitting: *Deleted

## 2016-08-06 DIAGNOSIS — F419 Anxiety disorder, unspecified: Secondary | ICD-10-CM | POA: Diagnosis present

## 2016-08-06 DIAGNOSIS — E1151 Type 2 diabetes mellitus with diabetic peripheral angiopathy without gangrene: Secondary | ICD-10-CM | POA: Diagnosis present

## 2016-08-06 DIAGNOSIS — Z794 Long term (current) use of insulin: Secondary | ICD-10-CM | POA: Diagnosis not present

## 2016-08-06 DIAGNOSIS — Z86711 Personal history of pulmonary embolism: Secondary | ICD-10-CM

## 2016-08-06 DIAGNOSIS — F17210 Nicotine dependence, cigarettes, uncomplicated: Secondary | ICD-10-CM | POA: Diagnosis present

## 2016-08-06 DIAGNOSIS — I739 Peripheral vascular disease, unspecified: Secondary | ICD-10-CM

## 2016-08-06 DIAGNOSIS — Z85820 Personal history of malignant melanoma of skin: Secondary | ICD-10-CM

## 2016-08-06 DIAGNOSIS — Z961 Presence of intraocular lens: Secondary | ICD-10-CM | POA: Diagnosis present

## 2016-08-06 DIAGNOSIS — Z9981 Dependence on supplemental oxygen: Secondary | ICD-10-CM

## 2016-08-06 DIAGNOSIS — L03115 Cellulitis of right lower limb: Secondary | ICD-10-CM | POA: Diagnosis not present

## 2016-08-06 DIAGNOSIS — Z23 Encounter for immunization: Secondary | ICD-10-CM

## 2016-08-06 DIAGNOSIS — J449 Chronic obstructive pulmonary disease, unspecified: Secondary | ICD-10-CM | POA: Diagnosis present

## 2016-08-06 DIAGNOSIS — M797 Fibromyalgia: Secondary | ICD-10-CM | POA: Diagnosis present

## 2016-08-06 DIAGNOSIS — E118 Type 2 diabetes mellitus with unspecified complications: Secondary | ICD-10-CM | POA: Diagnosis not present

## 2016-08-06 DIAGNOSIS — I509 Heart failure, unspecified: Secondary | ICD-10-CM | POA: Diagnosis present

## 2016-08-06 DIAGNOSIS — I10 Essential (primary) hypertension: Secondary | ICD-10-CM

## 2016-08-06 DIAGNOSIS — L03119 Cellulitis of unspecified part of limb: Secondary | ICD-10-CM

## 2016-08-06 DIAGNOSIS — E1165 Type 2 diabetes mellitus with hyperglycemia: Secondary | ICD-10-CM | POA: Diagnosis present

## 2016-08-06 DIAGNOSIS — Z9841 Cataract extraction status, right eye: Secondary | ICD-10-CM

## 2016-08-06 DIAGNOSIS — M109 Gout, unspecified: Secondary | ICD-10-CM | POA: Diagnosis present

## 2016-08-06 DIAGNOSIS — Z7902 Long term (current) use of antithrombotics/antiplatelets: Secondary | ICD-10-CM

## 2016-08-06 DIAGNOSIS — L0390 Cellulitis, unspecified: Secondary | ICD-10-CM | POA: Diagnosis present

## 2016-08-06 DIAGNOSIS — L02611 Cutaneous abscess of right foot: Principal | ICD-10-CM | POA: Diagnosis present

## 2016-08-06 DIAGNOSIS — Z7982 Long term (current) use of aspirin: Secondary | ICD-10-CM

## 2016-08-06 DIAGNOSIS — E7800 Pure hypercholesterolemia, unspecified: Secondary | ICD-10-CM | POA: Diagnosis present

## 2016-08-06 DIAGNOSIS — Z9842 Cataract extraction status, left eye: Secondary | ICD-10-CM

## 2016-08-06 DIAGNOSIS — E1142 Type 2 diabetes mellitus with diabetic polyneuropathy: Secondary | ICD-10-CM | POA: Diagnosis present

## 2016-08-06 DIAGNOSIS — F329 Major depressive disorder, single episode, unspecified: Secondary | ICD-10-CM | POA: Diagnosis present

## 2016-08-06 DIAGNOSIS — L02619 Cutaneous abscess of unspecified foot: Secondary | ICD-10-CM

## 2016-08-06 DIAGNOSIS — K219 Gastro-esophageal reflux disease without esophagitis: Secondary | ICD-10-CM | POA: Diagnosis present

## 2016-08-06 DIAGNOSIS — E119 Type 2 diabetes mellitus without complications: Secondary | ICD-10-CM

## 2016-08-06 DIAGNOSIS — Z79899 Other long term (current) drug therapy: Secondary | ICD-10-CM

## 2016-08-06 LAB — CBC WITH DIFFERENTIAL/PLATELET
BASOS PCT: 0 %
Basophils Absolute: 0 10*3/uL (ref 0.0–0.1)
Eosinophils Absolute: 0.2 10*3/uL (ref 0.0–0.7)
Eosinophils Relative: 1 %
HEMATOCRIT: 36.1 % (ref 36.0–46.0)
HEMOGLOBIN: 12.3 g/dL (ref 12.0–15.0)
Lymphocytes Relative: 12 %
Lymphs Abs: 1.7 10*3/uL (ref 0.7–4.0)
MCH: 29.5 pg (ref 26.0–34.0)
MCHC: 34.1 g/dL (ref 30.0–36.0)
MCV: 86.6 fL (ref 78.0–100.0)
MONOS PCT: 8 %
Monocytes Absolute: 1.2 10*3/uL — ABNORMAL HIGH (ref 0.1–1.0)
NEUTROS ABS: 11.4 10*3/uL — AB (ref 1.7–7.7)
NEUTROS PCT: 79 %
Platelets: 158 10*3/uL (ref 150–400)
RBC: 4.17 MIL/uL (ref 3.87–5.11)
RDW: 12.7 % (ref 11.5–15.5)
WBC: 14.5 10*3/uL — AB (ref 4.0–10.5)

## 2016-08-06 LAB — BASIC METABOLIC PANEL
Anion gap: 9 (ref 5–15)
BUN: 13 mg/dL (ref 6–20)
CHLORIDE: 102 mmol/L (ref 101–111)
CO2: 27 mmol/L (ref 22–32)
CREATININE: 0.78 mg/dL (ref 0.44–1.00)
Calcium: 8.9 mg/dL (ref 8.9–10.3)
GFR calc non Af Amer: >60 mL/min (ref >60)
Glucose, Bld: 299 mg/dL — ABNORMAL HIGH (ref 65–99)
Potassium: 3.2 mmol/L — ABNORMAL LOW (ref 3.5–5.1)
SODIUM: 138 mmol/L (ref 135–145)

## 2016-08-06 MED ORDER — ONDANSETRON HCL 4 MG PO TABS
4.0000 mg | ORAL_TABLET | Freq: Four times a day (QID) | ORAL | Status: DC | PRN
Start: 2016-08-06 — End: 2016-08-11

## 2016-08-06 MED ORDER — VANCOMYCIN HCL IN DEXTROSE 1-5 GM/200ML-% IV SOLN
1000.0000 mg | Freq: Once | INTRAVENOUS | Status: AC
Start: 2016-08-06 — End: 2016-08-07
  Administered 2016-08-06: 1000 mg via INTRAVENOUS
  Filled 2016-08-06: qty 200

## 2016-08-06 MED ORDER — POTASSIUM CHLORIDE 10 MEQ/100ML IV SOLN
10.0000 meq | INTRAVENOUS | Status: AC
  Administered 2016-08-07 (×3): 10 meq via INTRAVENOUS
  Filled 2016-08-06 (×3): qty 100

## 2016-08-06 MED ORDER — MIDODRINE HCL 5 MG PO TABS
5.0000 mg | ORAL_TABLET | Freq: Three times a day (TID) | ORAL | Status: DC
  Administered 2016-08-07 – 2016-08-11 (×13): 5 mg via ORAL
  Filled 2016-08-06 (×13): qty 1

## 2016-08-06 MED ORDER — ENOXAPARIN SODIUM 40 MG/0.4ML ~~LOC~~ SOLN
40.0000 mg | SUBCUTANEOUS | Status: DC
  Administered 2016-08-07 – 2016-08-11 (×4): 40 mg via SUBCUTANEOUS
  Filled 2016-08-06 (×5): qty 0.4

## 2016-08-06 MED ORDER — PIPERACILLIN-TAZOBACTAM 3.375 G IVPB
3.3750 g | Freq: Once | INTRAVENOUS | Status: AC
  Administered 2016-08-07: 3.375 g via INTRAVENOUS
  Filled 2016-08-06: qty 50

## 2016-08-06 MED ORDER — ROSUVASTATIN CALCIUM 20 MG PO TABS
40.0000 mg | ORAL_TABLET | Freq: Every day | ORAL | Status: DC
  Administered 2016-08-07 – 2016-08-11 (×4): 40 mg via ORAL
  Filled 2016-08-06 (×5): qty 2

## 2016-08-06 MED ORDER — DULOXETINE HCL 60 MG PO CPEP
60.0000 mg | ORAL_CAPSULE | Freq: Two times a day (BID) | ORAL | Status: DC
  Administered 2016-08-07 – 2016-08-11 (×9): 60 mg via ORAL
  Filled 2016-08-06: qty 2
  Filled 2016-08-06: qty 1
  Filled 2016-08-06: qty 2
  Filled 2016-08-06: qty 1
  Filled 2016-08-06: qty 2
  Filled 2016-08-06: qty 1
  Filled 2016-08-06: qty 2
  Filled 2016-08-06 (×2): qty 1
  Filled 2016-08-06: qty 2

## 2016-08-06 MED ORDER — MOMETASONE FURO-FORMOTEROL FUM 200-5 MCG/ACT IN AERO
2.0000 | INHALATION_SPRAY | Freq: Two times a day (BID) | RESPIRATORY_TRACT | Status: DC
  Filled 2016-08-06: qty 8.8

## 2016-08-06 MED ORDER — PANTOPRAZOLE SODIUM 40 MG PO TBEC
40.0000 mg | DELAYED_RELEASE_TABLET | Freq: Every day | ORAL | Status: DC
  Administered 2016-08-07 – 2016-08-11 (×4): 40 mg via ORAL
  Filled 2016-08-06 (×5): qty 1

## 2016-08-06 MED ORDER — ASPIRIN EC 81 MG PO TBEC
81.0000 mg | DELAYED_RELEASE_TABLET | Freq: Every day | ORAL | Status: DC
  Administered 2016-08-07: 81 mg via ORAL
  Filled 2016-08-06: qty 1

## 2016-08-06 MED ORDER — SODIUM CHLORIDE 0.9 % IV SOLN
INTRAVENOUS | Status: DC
  Administered 2016-08-07 – 2016-08-10 (×7): via INTRAVENOUS

## 2016-08-06 MED ORDER — ALPRAZOLAM 0.5 MG PO TABS: 0.5000 mg | ORAL_TABLET | Freq: Three times a day (TID) | ORAL | Status: DC | PRN

## 2016-08-06 MED ORDER — MECLIZINE HCL 12.5 MG PO TABS
12.5000 mg | ORAL_TABLET | Freq: Three times a day (TID) | ORAL | Status: DC
  Administered 2016-08-07 – 2016-08-11 (×14): 12.5 mg via ORAL
  Filled 2016-08-06 (×15): qty 1

## 2016-08-06 MED ORDER — HYDROMORPHONE HCL 1 MG/ML IJ SOLN
1.0000 mg | INTRAMUSCULAR | Status: DC | PRN
  Administered 2016-08-07 – 2016-08-09 (×7): 1 mg via INTRAVENOUS
  Filled 2016-08-06 (×7): qty 1

## 2016-08-06 MED ORDER — INSULIN ASPART 100 UNIT/ML ~~LOC~~ SOLN
0.0000 [IU] | Freq: Three times a day (TID) | SUBCUTANEOUS | Status: DC
  Administered 2016-08-07 (×2): 2 [IU] via SUBCUTANEOUS
  Administered 2016-08-07 – 2016-08-08 (×2): 3 [IU] via SUBCUTANEOUS
  Administered 2016-08-08: 1 [IU] via SUBCUTANEOUS
  Administered 2016-08-08 – 2016-08-11 (×4): 2 [IU] via SUBCUTANEOUS

## 2016-08-06 MED ORDER — ONDANSETRON HCL 4 MG/2ML IJ SOLN
4.0000 mg | Freq: Four times a day (QID) | INTRAMUSCULAR | Status: DC | PRN
  Administered 2016-08-07 – 2016-08-08 (×3): 4 mg via INTRAVENOUS
  Filled 2016-08-06 (×3): qty 2

## 2016-08-06 MED ORDER — SODIUM CHLORIDE 0.9 % IV BOLUS (SEPSIS)
1000.0000 mL | Freq: Once | INTRAVENOUS | Status: AC
  Administered 2016-08-06: 1000 mL via INTRAVENOUS

## 2016-08-06 MED ORDER — CLOPIDOGREL BISULFATE 75 MG PO TABS
75.0000 mg | ORAL_TABLET | Freq: Every day | ORAL | Status: DC
  Administered 2016-08-07: 75 mg via ORAL
  Filled 2016-08-06: qty 1

## 2016-08-06 MED ORDER — INSULIN GLARGINE 100 UNIT/ML ~~LOC~~ SOLN
50.0000 [IU] | Freq: Every day | SUBCUTANEOUS | Status: DC
  Administered 2016-08-07: 50 [IU] via SUBCUTANEOUS
  Filled 2016-08-06 (×2): qty 0.5

## 2016-08-06 NOTE — Progress Notes (Signed)
X-ray of the foot showed no definite osseous erosion, but showed prominent soft tissue air tracking about the plantar aspect of the forefoot between second and third metatarsophalangeal joints. Concern for infection with gas producing organism. Underlying necrotizing fasciitis cannot be excluded.  Dr. Sabra Heck called and discussed with general surgery Dr. Arnoldo Morale, who says continue with IV antibiotics for tonight and he will see patient in a.m. I will add clindamycin 600 mg IV every 8 hours.

## 2016-08-06 NOTE — ED Provider Notes (Addendum)
[facility] DEPT Provider Note   CSN: [id number] Arrival date & time: 08/06/16  1912     History   Chief Complaint Chief Complaint  Patient presents with  . Cellulitis    HPI Jacqueline Hansen is a 76 y.o. female.  HPI  The pt is a diabetic 76 y/o female with R leg swelling / redness that started 2 days ago - she has chills but no fevers, no n/v/d.  She has had peripheral neuropathy - has dec sensation - stepeed on a tac (unsure when), family pulled it out recently - then got red.  Had seen PCP 3 days ago and had exam on toenail - fungus - no redness at that time.  Sx are gradually worsening, now severe - pain and redness spreading rapidly.  CBG also rising rapidly.  Past Medical History:  Diagnosis Date  . Anxiety   . Arthritis    "joints" (09/08/2014)  . Asthma   . CHF (congestive heart failure) ([place])   . Chronic bronchitis ([place])    "get it q yr"  . COPD (chronic obstructive pulmonary disease) ([place])   . Depression   . Fibromyalgia   . Gastric ulcer   . GERD (gastroesophageal reflux disease)   . 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 History of blood transfusion ?2015   "cause I was throwing it up"  . History of hiatal hernia   . Hypercholesterolemia   . 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 On home oxygen therapy    "2L ususally at night time" (09/08/2014)  . Pulmonary embolism ([place])    "when I had my gallbladder taken out"  . Type II diabetes mellitus [facility])     Patient Active Problem List   Diagnosis Date Noted  . Benign neoplasm of sigmoid colon   . Rectal ulceration   . GI bleed 09/07/2014  . Colitis 09/07/2014  . COPD (chronic obstructive pulmonary disease) ([place]) 09/07/2014  . DM (diabetes mellitus) ([place]) 09/07/2014  . PAD (peripheral artery disease) ([place]) 09/07/2014  . Urinary retention 09/07/2014    Past Surgical History:  Procedure Laterality Date  . ABDOMINAL HYSTERECTOMY    . CARPAL TUNNEL RELEASE Bilateral   . CATARACT  EXTRACTION W/ INTRAOCULAR LENS  IMPLANT, BILATERAL Bilateral   . COLONOSCOPY N/A 09/09/2014   Procedure: COLONOSCOPY;  Surgeon: Irene Shipper, MD;  Location: [facility];  Service: Endoscopy;  Laterality: N/A;  . EXCISIONAL HEMORRHOIDECTOMY    . LAPAROSCOPIC CHOLECYSTECTOMY    . MELANOMA EXCISION     "off my nose"  . TUBAL LIGATION      OB History    No data available       Home Medications    Prior to Admission medications   Medication Sig Start Date End Date Taking? Authorizing Provider  ADVAIR DISKUS 250-50 MCG/DOSE AEPB Inhale 1 puff into the lungs 2 (two) times daily. 08/08/13   [provider]  ALPRAZolam Duanne Moron) 0.5 MG tablet Take 0.5 mg by mouth 3 (three) times daily. 07/18/13   [provider]  bisacodyl (DULCOLAX) 5 MG EC tablet Take 1 tablet (5 mg total) by mouth daily as needed for moderate constipation. 09/10/14   Kelvin Cellar, MD  CRESTOR 40 MG tablet Take 40 mg by mouth daily. 08/08/14   [provider]  docusate sodium (COLACE) 100 MG capsule Take 1 capsule (100 mg total) by mouth 2 (two) times daily. 09/10/14   Kelvin Cellar, MD  docusate sodium (COLACE) 100 MG capsule Take 1 capsule (100 mg total) by mouth 2 (two) times daily. 09/12/14   Kelvin Cellar, MD  DULoxetine (CYMBALTA) 60 MG capsule Take 60 mg by mouth 2 (two) times daily.    [provider]  fludrocortisone (FLORINEF) 0.1 MG tablet Take 0.1 mg by mouth daily. 07/15/13   [provider]  fluticasone (FLONASE) 50 MCG/ACT nasal spray Place 2 sprays into both nostrils daily. 06/12/13   [provider]  HYDROcodone-acetaminophen (NORCO/VICODIN) 5-325 MG per tablet Take 1-2 tablets by mouth every 6 (six) hours as needed for moderate pain. 08/11/13   Fredia Sorrow, MD  insulin glargine (LANTUS) 100 UNIT/ML injection Inject 0.5 mLs (50 Units total) into the skin at bedtime. 09/12/14   Kelvin Cellar, MD  meclizine (ANTIVERT) 12.5 MG tablet Take 12.5 mg by mouth 3  (three) times daily as needed. vertigo 06/12/13   [provider]  metFORMIN (GLUCOPHAGE) 500 MG tablet Take 500 mg by mouth 2 (two) times daily. 07/08/14   [provider]  metoCLOPramide (REGLAN) 5 MG tablet Take 5 mg by mouth 4 (four) times daily -  before meals and at bedtime.  06/12/13   [provider]  midodrine (PROAMATINE) 5 MG tablet Take 5 mg by mouth 3 (three) times daily. 06/12/13   [provider]  NEXIUM 40 MG capsule Take 40 mg by mouth daily. 08/09/13   [provider]  polyethylene glycol (MIRALAX / GLYCOLAX) packet Take 17 g by mouth daily. 09/12/14   Kelvin Cellar, MD  polyethylene glycol powder (MIRALAX) powder Take 255 g by mouth once. 09/10/14   Kelvin Cellar, MD  potassium chloride SA (K-DUR,KLOR-CON) 20 MEQ tablet Take 20 mEq by mouth daily. 08/08/14   [provider]  PROAIR HFA 108 (90 BASE) MCG/ACT inhaler Inhale 2 puffs into the lungs every 6 (six) hours as needed. Shortness of breath 08/09/13   [provider]  sulfamethoxazole-trimethoprim (BACTRIM DS,SEPTRA DS) 800-160 MG per tablet Take 1 tablet by mouth 2 (two) times daily. 09/12/14   Kelvin Cellar, MD  topiramate (TOPAMAX) 50 MG tablet Take 50 mg by mouth 2 (two) times daily. 06/24/14   [provider]  vancomycin 1,250 mg in sodium chloride 0.9 % 250 mL Inject 1,250 mg into the vein every 12 (twelve) hours. 09/12/14   Kelvin Cellar, MD  zolpidem (AMBIEN) 10 MG tablet Take 10 mg by mouth at bedtime as needed for sleep.  06/12/13   [provider]    Family History History reviewed. No pertinent family history.  Social History Social History  Substance Use Topics  . Smoking status: Current Every Day Smoker    Packs/day: 0.50    Years: 32.00    Types: Cigarettes  . Smokeless tobacco: Never Used  . Alcohol use Yes     Comment: 09/08/2014 "I drank a little bit a long time ago"     Allergies   Patient has no known allergies.   Review of  Systems Review of Systems  All other systems reviewed and are negative.    Physical Exam Updated Vital Signs BP 112/67 (BP Location: Right Arm)   Pulse 85   Temp 97.8 F (36.6 C) (Oral)   Resp 18   Ht 5\' 3"  (1.6 m)   Wt 94.3 kg (208 lb)   SpO2 97%   BMI 36.85 kg/m   Physical Exam  Constitutional: She appears well-developed and well-nourished. No distress.  HENT:  Head: Normocephalic and atraumatic.  Mouth/Throat: Oropharynx is clear and moist. No oropharyngeal exudate.  Eyes: Conjunctivae and EOM are normal. Pupils are equal, round, and reactive to light. Right eye exhibits no discharge. Left eye exhibits no discharge. No scleral icterus.  Neck: Normal range of motion. Neck supple. No JVD present. No thyromegaly present.  Cardiovascular: Normal rate, regular rhythm, normal heart sounds and intact distal pulses.  Exam reveals no gallop and no friction rub.   No murmur heard. Pulmonary/Chest: Effort normal and breath sounds normal. No respiratory distress. She has no wheezes. She has no rales.  Abdominal: Soft. Bowel sounds are normal. She exhibits no distension and no mass. There is no tenderness.  Musculoskeletal: Normal range of motion. She exhibits edema and tenderness.  RLE with swelling and ttp below the knee  Lymphadenopathy:    She has no cervical adenopathy.  Neurological: She is alert. Coordination normal.  Skin: Skin is warm and dry. Rash noted. There is erythema.  Erythema and warmth - foot and sopreading up leg.  No subcutaneous emphysema  Psychiatric: She has a normal mood and affect. Her behavior is normal.  Nursing note and vitals reviewed.    ED Treatments / Results  Labs (all labs ordered are listed, but only abnormal results are displayed) Labs Reviewed  CBC WITH DIFFERENTIAL/PLATELET - Abnormal; Notable for the following:       Result Value   WBC 14.5 (*)    Neutro Abs 11.4 (*)    Monocytes Absolute 1.2 (*)    All other components within normal  limits  BASIC METABOLIC PANEL - Abnormal; Notable for the following:    Potassium 3.2 (*)    Glucose, Bld 299 (*)    All other components within normal limits     Radiology No results found.  Procedures Procedures (including critical care time)  Medications Ordered in ED Medications  vancomycin (VANCOCIN) IVPB 1000 mg/200 mL premix (1,000 mg Intravenous New Bag/Given 08/06/16 2009)  sodium chloride 0.9 % bolus 1,000 mL (1,000 mLs Intravenous New Bag/Given 08/06/16 2009)     Initial Impression / Assessment and Plan / ED Course  I have reviewed the triage vital signs and the nursing notes.  Pertinent labs & imaging results that were available during my care of the patient were reviewed by me and considered in my medical decision making (see chart for details).     Leg has cellujlitis - no signs of abscess - need inpatient abx - diabetic with elevated CBG > 300.  Fluids, abx, admit.  Pt in agreement.  WBC up to 14,000 Renal function normal CBG < 300 Fluids, vancomycin D/w Dr. Darrick Meigs to admit  D/w Dr. Arnoldo Morale with Gen Surg who will see int he AM.  Final Clinical Impressions(s) / ED Diagnoses   Final diagnoses:  Cellulitis of right lower extremity    New Prescriptions New Prescriptions   No medications on file     Noemi Chapel, MD 08/06/16 2055    Noemi Chapel, MD 08/06/16 [phone]

## 2016-08-06 NOTE — Progress Notes (Signed)
ANTIBIOTIC CONSULT NOTE-Preliminary  Pharmacy Consult for Vancomycin and Zosyn Indication: Cellulitis  Allergies  Allergen Reactions  . Adhesive [Tape] Other (See Comments)    Reaction:  Tears pts skin     Patient Measurements: Height: 5\' 3"  (160 cm) Weight: 208 lb (94.3 kg) IBW/kg (Calculated) : 52.4  Vital Signs: Temp: 97.8 F (36.6 C) (06/02 1920) Temp Source: Oral (06/02 1920) BP: 112/67 (06/02 1920) Pulse Rate: 85 (06/02 1920)  Labs:  Recent Labs  08/06/16 2010  WBC 14.5*  HGB 12.3  PLT 158  CREATININE 0.78    Estimated Creatinine Clearance: 67.4 mL/min (by C-G formula based on SCr of 0.78 mg/dL).  No results for input(s): VANCOTROUGH, VANCOPEAK, VANCORANDOM, GENTTROUGH, GENTPEAK, GENTRANDOM, TOBRATROUGH, TOBRAPEAK, TOBRARND, AMIKACINPEAK, AMIKACINTROU, AMIKACIN in the last 72 hours.   Microbiology: No results found for this or any previous visit (from the past 720 hour(s)).  Medical History: Past Medical History:  Diagnosis Date  . Anxiety   . Arthritis    "joints" (09/08/2014)  . Asthma   . CHF (congestive heart failure) ([place])   . Chronic bronchitis ([place])    "get it q yr"  . COPD (chronic obstructive pulmonary disease) ([place])   . Depression   . Fibromyalgia   . Gastric ulcer   . GERD (gastroesophageal reflux disease)   . Grand mal seizure [facility])    "last one was in the 1970's" (09/08/2014)  . History of blood transfusion ?2015   "cause I was throwing it up"  . History of hiatal hernia   . Hypercholesterolemia   . 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 On home oxygen therapy    "2L ususally at night time" (09/08/2014)  . Pulmonary embolism ([place])    "when I had my gallbladder taken out"  . Type II diabetes mellitus ([facility])     Medications:  Vancomycin 1000 mg IV x 1 dose given in the ED  Assessment: 76 yo diabetic female seen in the ED for cellulitis of RLE. Pt has increased WBCs, reports chills. Blood cultures pending. Vancomycin and Zosyn to be dosed by  Pharmacy protocol.  Goal of Therapy:  Vancomycin troughs 10-15 mc/gml Eradicate infection  Plan:  Preliminary review of pertinent patient information completed.  Protocol will be initiated with a one-time dose of 3.375 Gm IV Zosyn.  Forestine Na clinical pharmacist will complete review during morning rounds to assess patient and finalize treatment regimen if needed.  Norberto Sorenson, [facility] 08/06/2016,11:46 PM

## 2016-08-06 NOTE — ED Triage Notes (Signed)
Pt reports 3-4 day history of redness and swelling to her R foot-   Her physician is in [place]

## 2016-08-06 NOTE — H&P (Signed)
TRH H&P    Patient Demographics:    Jacqueline Hansen, is a 76 y.o. female  MRN: [id number]  DOB - 01-16-1941  Admit Date - 08/06/2016  Referring MD/NP/PA: Dr Sabra Heck  Outpatient Primary MD for the patient is Keane Police, MD  Patient coming from: home  Chief Complaint  Patient presents with  . Cellulitis      HPI:    Jacqueline Hansen  is a 76 y.o. female, With history of diabetes mellitus, peripheral arterial disease, CHF, COPD came to hospital with redness and swelling of right foot. Patient says that she stepped on tac last week, which was pulled by grandson after that the foot started getting red. She was seen by PCP who told her that she has toenail fungus. And today when she saw NP at PCP office, she was told that she has gout. Patient came to ED for further evaluation.she complains of severe pain 10/10 intensity, involving the right foot and leg. Patient has poorly controlled diabetes mellitus with peripheral arterial disease. She denies fever but admits some chills. Denies nausea vomiting or diarrhea. No constipation.   denies abdominal pain or dysuria.   She complains of regurgitation of food, which is a chronic problem .Patient says that she had esophageal dilation 5 times in past several years at [facility]. She is not sure of diagnosis.    Review of systems:      A full 10 point Review of Systems was done, except as stated above, all other Review of Systems were negative.   With Past History of the following :    Past Medical History:  Diagnosis Date  . Anxiety   . Arthritis    "joints" (09/08/2014)  . Asthma   . CHF (congestive heart failure) ([place])   . Chronic bronchitis ([place])    "get it q yr"  . COPD (chronic obstructive pulmonary disease) ([place])   . Depression   . Fibromyalgia   . Gastric ulcer   . GERD (gastroesophageal reflux disease)   . 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 History of blood transfusion ?2015   "cause I was throwing it up"  . History of hiatal hernia   . Hypercholesterolemia   . 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 On home oxygen therapy    "2L ususally at night time" (09/08/2014)  . Pulmonary embolism ([place])    "when I had my gallbladder taken out"  . Type II diabetes mellitus ([place])       Past Surgical History:  Procedure Laterality Date  . ABDOMINAL HYSTERECTOMY    . CARPAL TUNNEL RELEASE Bilateral   . CATARACT EXTRACTION W/ INTRAOCULAR LENS  IMPLANT, BILATERAL Bilateral   . COLONOSCOPY N/A 09/09/2014   Procedure: COLONOSCOPY;  Surgeon: Irene Shipper, MD;  Location: [place];  Service: Endoscopy;  Laterality: N/A;  . EXCISIONAL HEMORRHOIDECTOMY    . LAPAROSCOPIC CHOLECYSTECTOMY    . MELANOMA EXCISION     "off my nose"  . TUBAL LIGATION  Social History:      Social History  Substance Use Topics  . Smoking status: Current Every Day Smoker    Packs/day: 0.50    Years: 32.00    Types: Cigarettes  . Smokeless tobacco: Never Used  . Alcohol use Yes     Comment: 09/08/2014 "I drank a little bit a long time ago"       Family History :   No pertinent family history    Home Medications:   Prior to Admission medications   Medication Sig Start Date End Date Taking? Authorizing Provider  albuterol (PROVENTIL HFA;VENTOLIN HFA) 108 (90 Base) MCG/ACT inhaler Inhale 1-2 puffs into the lungs every 6 (six) hours as needed for wheezing or shortness of breath.   Yes [provider]  ALPRAZolam Duanne Moron) 0.5 MG tablet Take 0.5 mg by mouth 3 (three) times daily as needed for anxiety.    Yes [provider]  aspirin EC 81 MG tablet Take 81 mg by mouth daily.   Yes [provider]  clopidogrel (PLAVIX) 75 MG tablet Take 75 mg by mouth daily.   Yes [provider]  DULoxetine (CYMBALTA) 60 MG capsule Take 60 mg by mouth 2 (two) times daily.   Yes [provider]   esomeprazole (NEXIUM) 40 MG capsule Take 40 mg by mouth daily at 12 noon.   Yes [provider]  fluticasone (FLONASE) 50 MCG/ACT nasal spray Place 2 sprays into both nostrils daily.   Yes [provider]  Fluticasone-Salmeterol (ADVAIR) 250-50 MCG/DOSE AEPB Inhale 1 puff into the lungs 2 (two) times daily.   Yes [provider]  insulin glargine (LANTUS) 100 unit/mL SOPN Inject 50 Units into the skin at bedtime.   Yes [provider]  meclizine (ANTIVERT) 12.5 MG tablet Take 12.5 mg by mouth 3 (three) times daily.    Yes [provider]  metFORMIN (GLUCOPHAGE) 500 MG tablet Take 500 mg by mouth 2 (two) times daily with a meal.    Yes [provider]  midodrine (PROAMATINE) 5 MG tablet Take 5 mg by mouth 3 (three) times daily.   Yes [provider]  rosuvastatin (CRESTOR) 40 MG tablet Take 40 mg by mouth daily.   Yes [provider]     Allergies:     Allergies  Allergen Reactions  . Adhesive [Tape] Other (See Comments)    Reaction:  Tears pts skin      Physical Exam:   Vitals  Blood pressure 112/67, pulse 85, temperature 97.8 F (36.6 C), temperature source Oral, resp. rate 18, height '5\' 3"'$  (1.6 m), weight 94.3 kg (208 lb), SpO2 97 %.  1.  General: Appears in no acute distress   2. Psychiatric:  Intact judgement and  insight, awake alert, oriented x 3.  3. Neurologic: No focal neurological deficits, all cranial nerves intact.Strength 5/5 all 4 extremities, sensation intact all 4 extremities, plantars down going.  4. Eyes :  anicteric sclerae, moist conjunctivae with no lid lag. PERRLA.  5. ENMT:  Oropharynx clear with moist mucous membranes and good dentition  6. Neck:  supple, no cervical lymphadenopathy appriciated, No thyromegaly  7. Respiratory : Normal respiratory effort, good air movement bilaterally,clear to  auscultation bilaterally  8. Cardiovascular : RRR, no gallops, rubs or murmurs,  no leg edema  9. Gastrointestinal:  Positive bowel sounds, abdomen soft, non-tender to palpation,no hepatosplenomegaly, no rigidity or guarding       10. Skin:  Right foot erythema , edema  noted .tender to palpation erythema extends to right leg   11.Musculoskeletal:  Good muscle tone,  joints appear normal , no effusions,  normal range of motion    Data Review:    CBC  Recent Labs Lab 08/06/16 2010  WBC 14.5*  HGB 12.3  HCT 36.1  PLT 158  MCV 86.6  MCH 29.5  MCHC 34.1  RDW 12.7  LYMPHSABS 1.7  MONOABS 1.2*  EOSABS 0.2  BASOSABS 0.0   ------------------------------------------------------------------------------------------------------------------  Chemistries   Recent Labs Lab 08/06/16 2010  NA 138  K 3.2*  CL 102  CO2 27  GLUCOSE 299*  BUN 13  CREATININE 0.78  CALCIUM 8.9   ------------------------------------------------------------------------------------------------------------------  ------------------------------------------------------------------------------------------------------------------ GFR: Estimated Creatinine Clearance: 67.4 mL/min (by C-G formula based on SCr of 0.78 mg/dL). Liver Function Tests: No results for input(s): AST, ALT, ALKPHOS, BILITOT, PROT, ALBUMIN in the last 168 hours. No results for input(s): LIPASE, AMYLASE in the last 168 hours. No results for input(s): AMMONIA in the last 168 hours. Coagulation Profile: No results for input(s): INR, PROTIME in the last 168 hours. Cardiac Enzymes: No results for input(s): CKTOTAL, CKMB, CKMBINDEX, TROPONINI in the last 168 hours. BNP (last 3 results) No results for input(s): PROBNP in the last 8760 hours. HbA1C: No results for input(s): HGBA1C in the last 72 hours. CBG: No results for input(s): GLUCAP in the last 168 hours. Lipid Profile: No results for input(s): CHOL, HDL, LDLCALC, TRIG, CHOLHDL, LDLDIRECT in the last 72 hours. Thyroid Function Tests: No results for  input(s): TSH, T4TOTAL, FREET4, T3FREE, THYROIDAB in the last 72 hours. Anemia Panel: No results for input(s): VITAMINB12, FOLATE, FERRITIN, TIBC, IRON, RETICCTPCT in the last 72 hours.  --------------------------------------------------------------------------------------------------------------- Urine analysis: No results found for: COLORURINE, APPEARANCEUR, LABSPEC, PHURINE, GLUCOSEU, HGBUR, BILIRUBINUR, KETONESUR, PROTEINUR, UROBILINOGEN, NITRITE, LEUKOCYTESUR    Imaging Results:      Assessment & Plan:    Active Problems:   DM (diabetes mellitus) ([place])   PAD (peripheral artery disease) ([facility])   Cellulitis   1. Cellulitis of right lower extremity - start patient on vancomycin and Zosyn, will obtain blood cultures . As patient has history of PAD, we'll also check x-ray of the foot, ESR, CRP to rule out underlying osteomyelitis. 2. Diabetes mellitus - continue Lantus 50 units subcutaneous daily, start sliding scale insulin with NovoLog. Hold Glucophage at this time.  3.  History of peripheral arterial disease -continue aspirin, Plavix . 4. Hyperlipidemia -continue Crestor  5.  COPD- stable, no shortness of breath. Continue  Advair    DVT Prophylaxis-   Lovenox   AM Labs Ordered, also please review Full Orders  Family Communication: Admission, patients condition and plan of care including tests being ordered have been discussed with the patient and her daughter at bedside  who indicate understanding and agree with the plan and Code Status.  Code Status:  full code   Admission statusobservation    Time spent in minutes  60 minutes   Jasun Gasparini S M.D on 08/06/2016 at 10:28 PM  Between 7am to 7pm - Pager - [phone]. After 7pm go to [email or web address] - password [facility]  [facility] - Office  [phone]

## 2016-08-06 NOTE — ED Triage Notes (Signed)
Pt with redness starting in right foot 4 days ago, states a tack was removed from bottom of foot 2 days ago.  Pt states that granddaughter found the tack, pt unaware that tack was in her foot.  Redness and swelling noted to right foot into LE.

## 2016-08-07 DIAGNOSIS — E1151 Type 2 diabetes mellitus with diabetic peripheral angiopathy without gangrene: Secondary | ICD-10-CM | POA: Diagnosis present

## 2016-08-07 DIAGNOSIS — L03119 Cellulitis of unspecified part of limb: Secondary | ICD-10-CM | POA: Diagnosis not present

## 2016-08-07 DIAGNOSIS — E118 Type 2 diabetes mellitus with unspecified complications: Secondary | ICD-10-CM | POA: Diagnosis not present

## 2016-08-07 DIAGNOSIS — Z79899 Other long term (current) drug therapy: Secondary | ICD-10-CM | POA: Diagnosis not present

## 2016-08-07 DIAGNOSIS — E1165 Type 2 diabetes mellitus with hyperglycemia: Secondary | ICD-10-CM | POA: Diagnosis present

## 2016-08-07 DIAGNOSIS — Z9842 Cataract extraction status, left eye: Secondary | ICD-10-CM | POA: Diagnosis not present

## 2016-08-07 DIAGNOSIS — Z7982 Long term (current) use of aspirin: Secondary | ICD-10-CM | POA: Diagnosis not present

## 2016-08-07 DIAGNOSIS — I739 Peripheral vascular disease, unspecified: Secondary | ICD-10-CM | POA: Diagnosis not present

## 2016-08-07 DIAGNOSIS — M109 Gout, unspecified: Secondary | ICD-10-CM | POA: Diagnosis present

## 2016-08-07 DIAGNOSIS — F419 Anxiety disorder, unspecified: Secondary | ICD-10-CM | POA: Diagnosis present

## 2016-08-07 DIAGNOSIS — L02619 Cutaneous abscess of unspecified foot: Secondary | ICD-10-CM | POA: Diagnosis not present

## 2016-08-07 DIAGNOSIS — Z794 Long term (current) use of insulin: Secondary | ICD-10-CM | POA: Diagnosis not present

## 2016-08-07 DIAGNOSIS — E1142 Type 2 diabetes mellitus with diabetic polyneuropathy: Secondary | ICD-10-CM | POA: Diagnosis present

## 2016-08-07 DIAGNOSIS — Z7902 Long term (current) use of antithrombotics/antiplatelets: Secondary | ICD-10-CM | POA: Diagnosis not present

## 2016-08-07 DIAGNOSIS — F17210 Nicotine dependence, cigarettes, uncomplicated: Secondary | ICD-10-CM | POA: Diagnosis present

## 2016-08-07 DIAGNOSIS — L0390 Cellulitis, unspecified: Secondary | ICD-10-CM | POA: Diagnosis present

## 2016-08-07 DIAGNOSIS — K219 Gastro-esophageal reflux disease without esophagitis: Secondary | ICD-10-CM | POA: Diagnosis present

## 2016-08-07 DIAGNOSIS — M797 Fibromyalgia: Secondary | ICD-10-CM | POA: Diagnosis present

## 2016-08-07 DIAGNOSIS — L02611 Cutaneous abscess of right foot: Secondary | ICD-10-CM | POA: Diagnosis present

## 2016-08-07 DIAGNOSIS — J449 Chronic obstructive pulmonary disease, unspecified: Secondary | ICD-10-CM | POA: Diagnosis present

## 2016-08-07 DIAGNOSIS — I10 Essential (primary) hypertension: Secondary | ICD-10-CM | POA: Diagnosis not present

## 2016-08-07 DIAGNOSIS — Z9841 Cataract extraction status, right eye: Secondary | ICD-10-CM | POA: Diagnosis not present

## 2016-08-07 DIAGNOSIS — Z86711 Personal history of pulmonary embolism: Secondary | ICD-10-CM | POA: Diagnosis not present

## 2016-08-07 DIAGNOSIS — Z23 Encounter for immunization: Secondary | ICD-10-CM | POA: Diagnosis present

## 2016-08-07 DIAGNOSIS — L03115 Cellulitis of right lower limb: Secondary | ICD-10-CM | POA: Diagnosis present

## 2016-08-07 DIAGNOSIS — E7800 Pure hypercholesterolemia, unspecified: Secondary | ICD-10-CM | POA: Diagnosis present

## 2016-08-07 DIAGNOSIS — I509 Heart failure, unspecified: Secondary | ICD-10-CM | POA: Diagnosis present

## 2016-08-07 DIAGNOSIS — F329 Major depressive disorder, single episode, unspecified: Secondary | ICD-10-CM | POA: Diagnosis present

## 2016-08-07 DIAGNOSIS — Z85820 Personal history of malignant melanoma of skin: Secondary | ICD-10-CM | POA: Diagnosis not present

## 2016-08-07 DIAGNOSIS — Z961 Presence of intraocular lens: Secondary | ICD-10-CM | POA: Diagnosis present

## 2016-08-07 DIAGNOSIS — Z9981 Dependence on supplemental oxygen: Secondary | ICD-10-CM | POA: Diagnosis not present

## 2016-08-07 LAB — COMPREHENSIVE METABOLIC PANEL
ALT: 10 U/L — AB (ref 14–54)
AST: 9 U/L — AB (ref 15–41)
Albumin: 2.5 g/dL — ABNORMAL LOW (ref 3.5–5.0)
Alkaline Phosphatase: 55 U/L (ref 38–126)
Anion gap: 9 (ref 5–15)
BILIRUBIN TOTAL: 0.7 mg/dL (ref 0.3–1.2)
BUN: 8 mg/dL (ref 6–20)
CO2: 25 mmol/L (ref 22–32)
CREATININE: 0.55 mg/dL (ref 0.44–1.00)
Calcium: 8.4 mg/dL — ABNORMAL LOW (ref 8.9–10.3)
Chloride: 104 mmol/L (ref 101–111)
GFR calc Af Amer: >60 mL/min (ref >60)
Glucose, Bld: 251 mg/dL — ABNORMAL HIGH (ref 65–99)
Potassium: 3.8 mmol/L (ref 3.5–5.1)
Sodium: 138 mmol/L (ref 135–145)
TOTAL PROTEIN: 6.1 g/dL — AB (ref 6.5–8.1)

## 2016-08-07 LAB — GLUCOSE, CAPILLARY
GLUCOSE-CAPILLARY: 159 mg/dL — AB (ref 65–99)
Glucose-Capillary: 257 mg/dL — ABNORMAL HIGH (ref 65–99)
Glucose-Capillary: 233 mg/dL — ABNORMAL HIGH (ref 65–99)
Glucose-Capillary: 180 mg/dL — ABNORMAL HIGH (ref 65–99)
Glucose-Capillary: 174 mg/dL — ABNORMAL HIGH (ref 65–99)

## 2016-08-07 LAB — SEDIMENTATION RATE: Sed Rate: 61 mm/hr — ABNORMAL HIGH (ref 0–22)

## 2016-08-07 LAB — CBC
HEMATOCRIT: 34.4 % — AB (ref 36.0–46.0)
Hemoglobin: 11.5 g/dL — ABNORMAL LOW (ref 12.0–15.0)
MCH: 29 pg (ref 26.0–34.0)
MCHC: 33.4 g/dL (ref 30.0–36.0)
MCV: 86.6 fL (ref 78.0–100.0)
Platelets: 178 10*3/uL (ref 150–400)
RBC: 3.97 MIL/uL (ref 3.87–5.11)
RDW: 12.7 % (ref 11.5–15.5)
WBC: 14.4 10*3/uL — ABNORMAL HIGH (ref 4.0–10.5)

## 2016-08-07 LAB — MRSA PCR SCREENING: MRSA BY PCR: NEGATIVE

## 2016-08-07 LAB — C-REACTIVE PROTEIN: CRP: 26.9 mg/dL — ABNORMAL HIGH (ref <1.0)

## 2016-08-07 MED ORDER — CLINDAMYCIN PHOSPHATE 600 MG/50ML IV SOLN
INTRAVENOUS | Status: AC
  Filled 2016-08-07: qty 50

## 2016-08-07 MED ORDER — INSULIN GLARGINE 100 UNIT/ML ~~LOC~~ SOLN
53.0000 [IU] | Freq: Every day | SUBCUTANEOUS | Status: DC
  Administered 2016-08-07 – 2016-08-08 (×2): 53 [IU] via SUBCUTANEOUS
  Filled 2016-08-07 (×4): qty 0.53

## 2016-08-07 MED ORDER — TETANUS-DIPHTH-ACELL PERTUSSIS 5-2.5-18.5 LF-MCG/0.5 IM SUSP
0.5000 mL | Freq: Once | INTRAMUSCULAR | Status: AC
  Administered 2016-08-07: 0.5 mL via INTRAMUSCULAR
  Filled 2016-08-07: qty 0.5

## 2016-08-07 MED ORDER — MOMETASONE FURO-FORMOTEROL FUM 200-5 MCG/ACT IN AERO
2.0000 | INHALATION_SPRAY | Freq: Two times a day (BID) | RESPIRATORY_TRACT | Status: DC
  Administered 2016-08-07 – 2016-08-08 (×4): 2 via RESPIRATORY_TRACT
  Filled 2016-08-07 (×2): qty 8.8

## 2016-08-07 MED ORDER — VANCOMYCIN HCL IN DEXTROSE 1-5 GM/200ML-% IV SOLN
1000.0000 mg | Freq: Two times a day (BID) | INTRAVENOUS | Status: DC
  Administered 2016-08-07 – 2016-08-08 (×4): 1000 mg via INTRAVENOUS
  Filled 2016-08-07 (×4): qty 200

## 2016-08-07 MED ORDER — PIPERACILLIN-TAZOBACTAM 3.375 G IVPB
3.3750 g | Freq: Three times a day (TID) | INTRAVENOUS | Status: DC
Start: 2016-08-07 — End: 2016-08-11
  Administered 2016-08-07 – 2016-08-11 (×12): 3.375 g via INTRAVENOUS
  Filled 2016-08-07 (×11): qty 50

## 2016-08-07 MED ORDER — PNEUMOCOCCAL VAC POLYVALENT 25 MCG/0.5ML IJ INJ
0.5000 mL | INJECTION | INTRAMUSCULAR | Status: AC
  Administered 2016-08-08: 0.5 mL via INTRAMUSCULAR
  Filled 2016-08-07: qty 0.5

## 2016-08-07 MED ORDER — CLINDAMYCIN PHOSPHATE 600 MG/50ML IV SOLN
600.0000 mg | Freq: Three times a day (TID) | INTRAVENOUS | Status: DC
  Administered 2016-08-07 – 2016-08-09 (×8): 600 mg via INTRAVENOUS
  Filled 2016-08-07 (×11): qty 50

## 2016-08-07 NOTE — Progress Notes (Signed)
Patient ID: Jacqueline Hansen, female   DOB: Oct 12, 1940, 76 y.o.   MRN: [id number]  GENERAL SURGERY is handling Diabetic feet at [facility]   No formal consult done, chart review only

## 2016-08-07 NOTE — Progress Notes (Signed)
PROGRESS NOTE    Chan Rosasco  DDU:[id number] DOB: [birth year] DOA: 08/06/2016 PCP: Keane Police, MD     Brief Narrative:  76 y/o woman admitted from home on 6/2 for RLE cellulitis. This began after she stepped on a tack 3 days PTA. Dr. Arnoldo Morale is following. Admission was requested.   Assessment & Plan:   Active Problems:   DM (diabetes mellitus) ([place])   PAD (peripheral artery disease) ([facility])   Cellulitis   Cellulitis and abscess of foot   Cellulitis of right Foot -Following a puncture injury. -Agree with tetanus prophylaxis and MRI to further evaluate extent of disease. -Continue broad spectrum abx therapy.  DM -CBGs a little elevated. -Will increase lantus to 53 units. -continue to monitor and adjust regimen as needed.  Hyperlipidemia -Continue statin  COPD -Stable.  PAD -Continue ASA, plavix will be held in case surgery required.   DVT prophylaxis: lovenox Code Status: full code Family Communication: patient only Disposition Plan: MRI  Consultants:   Surgery, Dr. Arnoldo Morale  Procedures:   None  Antimicrobials:  Anti-infectives    Start     Dose/Rate Route Frequency Ordered Stop   08/07/16 1000  vancomycin (VANCOCIN) IVPB 1000 mg/200 mL premix     1,000 mg 200 mL/hr over 60 Minutes Intravenous Every 12 hours 08/07/16 0956     08/07/16 1000  piperacillin-tazobactam (ZOSYN) IVPB 3.375 g     3.375 g 12.5 mL/hr over 240 Minutes Intravenous Every 8 hours 08/07/16 0956     08/07/16 0300  clindamycin (CLEOCIN) IVPB 600 mg     600 mg 100 mL/hr over 30 Minutes Intravenous Every 8 hours 08/07/16 0202     08/07/16 0000  piperacillin-tazobactam (ZOSYN) IVPB 3.375 g     3.375 g 12.5 mL/hr over 240 Minutes Intravenous  Once 08/06/16 2343 08/07/16 0625   08/06/16 2000  vancomycin (VANCOCIN) IVPB 1000 mg/200 mL premix     1,000 mg 200 mL/hr over 60 Minutes Intravenous  Once 08/06/16 1949 08/07/16 0626       Subjective: In good spirits. Wondering what the  plan is.  Objective: Vitals:   08/06/16 1920 08/07/16 0123 08/07/16 0644 08/07/16 1145  BP: 112/67 (!) 149/69 (!) 135/45   Pulse: 85 93 86   Resp: 18 16 18    Temp: 97.8 F (36.6 C) 98.9 F (37.2 C) 99.3 F (37.4 C)   TempSrc: Oral Oral Oral   SpO2: 97% 94% 93% 90%  Weight:  94.7 kg (208 lb 10.9 oz)    Height:  5\' 3"  (1.6 m)      Intake/Output Summary (Last 24 hours) at 08/07/16 1413 Last data filed at 08/07/16 0645  Gross per 24 hour  Intake          1703.33 ml  Output                0 ml  Net          1703.33 ml   Filed Weights   08/06/16 1919 08/07/16 0123  Weight: 94.3 kg (208 lb) 94.7 kg (208 lb 10.9 oz)    Examination:   General exam: Alert, awake, oriented x 3 Respiratory system: Clear to auscultation. Respiratory effort normal. Cardiovascular system:RRR. No murmurs, rubs, gallops. Gastrointestinal system: Abdomen is nondistended, soft and nontender. No organomegaly or masses felt. Normal bowel sounds heard. Central nervous system: Alert and oriented. No focal neurological deficits. Extremities: RLE: edema and erythema surrounding a puncture wound in between her second and third toes that extends  up to mid-shin. Skin: No rashes, lesions or ulcers Psychiatry: Judgement and insight appear normal. Mood & affect appropriate.     Data Reviewed: I have personally reviewed following labs and imaging studies  CBC:  Recent Labs Lab 08/06/16 2010 08/07/16 0656  WBC 14.5* 14.4*  NEUTROABS 11.4*  --   HGB 12.3 11.5*  HCT 36.1 34.4*  MCV 86.6 86.6  PLT 158 211   Basic Metabolic Panel:  Recent Labs Lab 08/06/16 2010 08/07/16 0656  NA 138 138  K 3.2* 3.8  CL 102 104  CO2 27 25  GLUCOSE 299* 251*  BUN 13 8  CREATININE 0.78 0.55  CALCIUM 8.9 8.4*   GFR: Estimated Creatinine Clearance: 67.5 mL/min (by C-G formula based on SCr of 0.55 mg/dL). Liver Function Tests:  Recent Labs Lab 08/07/16 0656  AST 9*  ALT 10*  ALKPHOS 55  BILITOT 0.7  PROT  6.1*  ALBUMIN 2.5*   No results for input(s): LIPASE, AMYLASE in the last 168 hours. No results for input(s): AMMONIA in the last 168 hours. Coagulation Profile: No results for input(s): INR, PROTIME in the last 168 hours. Cardiac Enzymes: No results for input(s): CKTOTAL, CKMB, CKMBINDEX, TROPONINI in the last 168 hours. BNP (last 3 results) No results for input(s): PROBNP in the last 8760 hours. HbA1C: No results for input(s): HGBA1C in the last 72 hours. CBG:  Recent Labs Lab 08/07/16 0127 08/07/16 0729 08/07/16 1311  GLUCAP 257* 233* 180*   Lipid Profile: No results for input(s): CHOL, HDL, LDLCALC, TRIG, CHOLHDL, LDLDIRECT in the last 72 hours. Thyroid Function Tests: No results for input(s): TSH, T4TOTAL, FREET4, T3FREE, THYROIDAB in the last 72 hours. Anemia Panel: No results for input(s): VITAMINB12, FOLATE, FERRITIN, TIBC, IRON, RETICCTPCT in the last 72 hours. Urine analysis: No results found for: COLORURINE, APPEARANCEUR, LABSPEC, PHURINE, GLUCOSEU, HGBUR, BILIRUBINUR, KETONESUR, PROTEINUR, UROBILINOGEN, NITRITE, LEUKOCYTESUR Sepsis Labs: @LABRCNTIP (procalcitonin:4,lacticidven:4)  ) Recent Results (from the past 240 hour(s))  MRSA PCR Screening     Status: None   Collection Time: 08/07/16 12:45 AM  Result Value Ref Range Status   MRSA by PCR NEGATIVE NEGATIVE Final    Comment:        The GeneXpert MRSA Assay (FDA approved for NASAL specimens only), is one component of a comprehensive MRSA colonization surveillance program. It is not intended to diagnose MRSA infection nor to guide or monitor treatment for MRSA infections.          Radiology Studies: Dg Foot 2 Views Right  Result Date: 08/06/2016 CLINICAL DATA:  Acute onset of right foot swelling and erythema. Right foot pain. Tack removed from plantar surface of foot 2 days ago. Initial encounter. EXAM: RIGHT FOOT - 2 VIEW COMPARISON:  None. FINDINGS: There is no evidence of fracture or  dislocation. No definite osseous erosion is seen, though evaluation for osteomyelitis is limited on radiograph. The joint spaces are preserved. There is no evidence of talar subluxation; the subtalar joint is unremarkable in appearance. Diffuse soft tissue swelling is noted about the forefoot and midfoot. Prominent soft tissue air is seen tracking about the plantar aspect of the forefoot, between the second and third metatarsophalangeal joints. IMPRESSION: 1. No evidence fracture or dislocation. 2. No definite osseous erosion seen, though evaluation for osteomyelitis is limited on radiograph,. If there is significant clinical concern for osteomyelitis, MRI of the right foot could be considered for further evaluation. 3. Prominent soft tissue air tracking about the plantar aspect of the forefoot, between the second  and third metatarsophalangeal joints. This raises concern for infection with a gas producing organism. Underlying necrotizing fasciitis cannot be excluded. These results were called by telephone at the time of interpretation on 08/06/2016 at 10:48 pm to Dr. Noemi Chapel, who verbally acknowledged these results. Electronically Signed   By: Garald Balding M.D.   On: 08/06/2016 22:48        Scheduled Meds: . DULoxetine  60 mg Oral BID  . enoxaparin (LOVENOX) injection  40 mg Subcutaneous Q24H  . insulin aspart  0-9 Units Subcutaneous TID WC  . insulin glargine  50 Units Subcutaneous QHS  . meclizine  12.5 mg Oral TID  . midodrine  5 mg Oral TID AC  . mometasone-formoterol  2 puff Inhalation BID  . pantoprazole  40 mg Oral Daily  . [START ON 08/08/2016] pneumococcal 23 valent vaccine  0.5 mL Intramuscular Tomorrow-1000  . rosuvastatin  40 mg Oral Daily   Continuous Infusions: . sodium chloride 50 mL/hr at 08/07/16 0041  . clindamycin (CLEOCIN) IV 600 mg (08/07/16 1315)  . piperacillin-tazobactam (ZOSYN)  IV    . vancomycin       LOS: 0 days    Time spent: 25 minutes. Greater than 50%  of this time was spent in direct contact with the patient coordinating care.     Lelon Frohlich, MD [facility] Pager [phone]  If 7PM-7AM, please contact night-coverage [email or web address] Password [email or web address] 08/07/2016, 2:13 PM

## 2016-08-07 NOTE — Progress Notes (Signed)
Pharmacy Antibiotic Note  Jacqueline Hansen is a 76 y.o. female admitted on 08/06/2016 with cellulitis.  Pharmacy has been consulted for Vancomycin and Zosyn dosing.  Plan: Vancomycin 1000mg  IV every 12 hours.  Goal trough 10-15 mcg/mL. Zosyn 3.375g IV q8h (4 hour infusion).  Also on Clindamycin 600mg  IV q8h, concerned for necrotizing fascitis F/U cxs and clinical progress Monitor V/S, labs, and levels as indicated  Height: 5\' 3"  (160 cm) Weight: 208 lb 10.9 oz (94.7 kg) IBW/kg (Calculated) : 52.4  Temp (24hrs), Avg:98.7 F (37.1 C), Min:97.8 F (36.6 C), Max:99.3 F (37.4 C)   Recent Labs Lab 08/06/16 2010 08/07/16 0656  WBC 14.5* 14.4*  CREATININE 0.78 0.55    Estimated Creatinine Clearance: 67.5 mL/min (by C-G formula based on SCr of 0.55 mg/dL).    Allergies  Allergen Reactions  . Adhesive [Tape] Other (See Comments)    Reaction:  Tears pts skin     Antimicrobials this admission: Vancomcyin 6/2 >>  Zosyn 6/3  >>  Clindamycin 6/3 >>  Dose adjustments this admission: N/a  Microbiology results: 6/3  MRSA PCR: negative  Thank you for allowing pharmacy to be a part of this patient's care.  Isac Sarna, BS Pharm D, [place] Clinical Pharmacist Pager [phone] 08/07/2016 9:57 AM

## 2016-08-07 NOTE — Consult Note (Signed)
Reason for Consult: Cellulitis, right foot Referring Physician: Dr. Adela Glimpse Jacqueline Hansen is an 76 y.o. female.  HPI: Patient is a 76 year old white female who states she stepped on attack 3 days ago. She was being followed by her primary care physician in [place] and was told that she may have a flareup of gout. She has a long-standing history of diabetes mellitus and peripheral neuropathy. She does state that she has some pain in the right foot. She presented [facility] emergency room and was found to have an elevated white blood cell count, hyperglycemia, and a small amount of gas in the soft tissue in the right foot between the second and third toes. She denies any drainage from the foot. She denies any pain at the present time.  Past Medical History:  Diagnosis Date  . Anxiety   . Arthritis    "joints" (09/08/2014)  . Asthma   . CHF (congestive heart failure) ([place])   . Chronic bronchitis ([place])    "get it q yr"  . COPD (chronic obstructive pulmonary disease) ([place])   . Depression   . Fibromyalgia   . Gastric ulcer   . GERD (gastroesophageal reflux disease)   . 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 History of blood transfusion ?2015   "cause I was throwing it up"  . History of hiatal hernia   . Hypercholesterolemia   . 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[place])   . On home oxygen therapy    "2L ususally at night time" (09/08/2014)  . Pulmonary embolism ([place])    "when I had my gallbladder taken out"  . Type II diabetes mellitus ([place])     Past Surgical History:  Procedure Laterality Date  . ABDOMINAL HYSTERECTOMY    . CARPAL TUNNEL RELEASE Bilateral   . CATARACT EXTRACTION W/ INTRAOCULAR LENS  IMPLANT, BILATERAL Bilateral   . COLONOSCOPY N/A 09/09/2014   Procedure: COLONOSCOPY;  Surgeon: Irene Shipper, MD;  Location: [facility];  Service: Endoscopy;  Laterality: N/A;  . EXCISIONAL HEMORRHOIDECTOMY    . LAPAROSCOPIC CHOLECYSTECTOMY    . MELANOMA EXCISION      "off my nose"  . TUBAL LIGATION      History reviewed. No pertinent family history.  Social History:  reports that she has been smoking Cigarettes.  She has a 16.00 pack-year smoking history. She has never used smokeless tobacco. She reports that she drinks alcohol. She reports that she does not use drugs.  Allergies:  Allergies  Allergen Reactions  . Adhesive [Tape] Other (See Comments)    Reaction:  Tears pts skin     Medications:  Scheduled: . DULoxetine  60 mg Oral BID  . enoxaparin (LOVENOX) injection  40 mg Subcutaneous Q24H  . insulin aspart  0-9 Units Subcutaneous TID WC  . insulin glargine  50 Units Subcutaneous QHS  . meclizine  12.5 mg Oral TID  . midodrine  5 mg Oral TID AC  . mometasone-formoterol  2 puff Inhalation BID  . pantoprazole  40 mg Oral Daily  . [START ON 08/08/2016] pneumococcal 23 valent vaccine  0.5 mL Intramuscular Tomorrow-1000  . rosuvastatin  40 mg Oral Daily  . Tdap  0.5 mL Intramuscular Once    Results for orders placed or performed during the hospital encounter of 08/06/16 (from the past 48 hour(s))  CBC with Differential/Platelet     Status: Abnormal   Collection Time: 08/06/16  8:10 PM  Result Value  Ref Range   WBC 14.5 (H) 4.0 - 10.5 K/uL   RBC 4.17 3.87 - 5.11 MIL/uL   Hemoglobin 12.3 12.0 - 15.0 g/dL   HCT 36.1 36.0 - 46.0 %   MCV 86.6 78.0 - 100.0 fL   MCH 29.5 26.0 - 34.0 pg   MCHC 34.1 30.0 - 36.0 g/dL   RDW 12.7 11.5 - 15.5 %   Platelets 158 150 - 400 K/uL   Neutrophils Relative % 79 %   Neutro Abs 11.4 (H) 1.7 - 7.7 K/uL   Lymphocytes Relative 12 %   Lymphs Abs 1.7 0.7 - 4.0 K/uL   Monocytes Relative 8 %   Monocytes Absolute 1.2 (H) 0.1 - 1.0 K/uL   Eosinophils Relative 1 %   Eosinophils Absolute 0.2 0.0 - 0.7 K/uL   Basophils Relative 0 %   Basophils Absolute 0.0 0.0 - 0.1 K/uL  Basic metabolic panel     Status: Abnormal   Collection Time: 08/06/16  8:10 PM  Result Value Ref Range   Sodium 138 135 - 145 mmol/L    Potassium 3.2 (L) 3.5 - 5.1 mmol/L   Chloride 102 101 - 111 mmol/L   CO2 27 22 - 32 mmol/L   Glucose, Bld 299 (H) 65 - 99 mg/dL   BUN 13 6 - 20 mg/dL   Creatinine, Ser 0.78 0.44 - 1.00 mg/dL   Calcium 8.9 8.9 - 10.3 mg/dL   GFR calc non Af Amer >60 >60 mL/min   GFR calc Af Amer >60 >60 mL/min    Comment: (NOTE) The eGFR has been calculated using the CKD EPI equation. This calculation has not been validated in all clinical situations. eGFR's persistently <60 mL/min signify possible Chronic Kidney Disease.    Anion gap 9 5 - 15  Sedimentation rate     Status: Abnormal   Collection Time: 08/06/16 11:05 PM  Result Value Ref Range   Sed Rate 61 (H) 0 - 22 mm/hr  MRSA PCR Screening     Status: None   Collection Time: 08/07/16 12:45 AM  Result Value Ref Range   MRSA by PCR NEGATIVE NEGATIVE    Comment:        The GeneXpert MRSA Assay (FDA approved for NASAL specimens only), is one component of a comprehensive MRSA colonization surveillance program. It is not intended to diagnose MRSA infection nor to guide or monitor treatment for MRSA infections.   Glucose, capillary     Status: Abnormal   Collection Time: 08/07/16  1:27 AM  Result Value Ref Range   Glucose-Capillary 257 (H) 65 - 99 mg/dL   Comment 1 Notify RN    Comment 2 Document in Chart   CBC     Status: Abnormal   Collection Time: 08/07/16  6:56 AM  Result Value Ref Range   WBC 14.4 (H) 4.0 - 10.5 K/uL   RBC 3.97 3.87 - 5.11 MIL/uL   Hemoglobin 11.5 (L) 12.0 - 15.0 g/dL   HCT 34.4 (L) 36.0 - 46.0 %   MCV 86.6 78.0 - 100.0 fL   MCH 29.0 26.0 - 34.0 pg   MCHC 33.4 30.0 - 36.0 g/dL   RDW 12.7 11.5 - 15.5 %   Platelets 178 150 - 400 K/uL  Comprehensive metabolic panel     Status: Abnormal   Collection Time: 08/07/16  6:56 AM  Result Value Ref Range   Sodium 138 135 - 145 mmol/L   Potassium 3.8 3.5 - 5.1 mmol/L   Chloride  104 101 - 111 mmol/L   CO2 25 22 - 32 mmol/L   Glucose, Bld 251 (H) 65 - 99 mg/dL   BUN 8 6  - 20 mg/dL   Creatinine, Ser 0.55 0.44 - 1.00 mg/dL   Calcium 8.4 (L) 8.9 - 10.3 mg/dL   Total Protein 6.1 (L) 6.5 - 8.1 g/dL   Albumin 2.5 (L) 3.5 - 5.0 g/dL   AST 9 (L) 15 - 41 U/L   ALT 10 (L) 14 - 54 U/L   Alkaline Phosphatase 55 38 - 126 U/L   Total Bilirubin 0.7 0.3 - 1.2 mg/dL   GFR calc non Af Amer >60 >60 mL/min   GFR calc Af Amer >60 >60 mL/min    Comment: (NOTE) The eGFR has been calculated using the CKD EPI equation. This calculation has not been validated in all clinical situations. eGFR's persistently <60 mL/min signify possible Chronic Kidney Disease.    Anion gap 9 5 - 15  Glucose, capillary     Status: Abnormal   Collection Time: 08/07/16  7:29 AM  Result Value Ref Range   Glucose-Capillary 233 (H) 65 - 99 mg/dL    Dg Foot 2 Views Right  Result Date: 08/06/2016 CLINICAL DATA:  Acute onset of right foot swelling and erythema. Right foot pain. Tack removed from plantar surface of foot 2 days ago. Initial encounter. EXAM: RIGHT FOOT - 2 VIEW COMPARISON:  None. FINDINGS: There is no evidence of fracture or dislocation. No definite osseous erosion is seen, though evaluation for osteomyelitis is limited on radiograph. The joint spaces are preserved. There is no evidence of talar subluxation; the subtalar joint is unremarkable in appearance. Diffuse soft tissue swelling is noted about the forefoot and midfoot. Prominent soft tissue air is seen tracking about the plantar aspect of the forefoot, between the second and third metatarsophalangeal joints. IMPRESSION: 1. No evidence fracture or dislocation. 2. No definite osseous erosion seen, though evaluation for osteomyelitis is limited on radiograph,. If there is significant clinical concern for osteomyelitis, MRI of the right foot could be considered for further evaluation. 3. Prominent soft tissue air tracking about the plantar aspect of the forefoot, between the second and third metatarsophalangeal joints. This raises concern for  infection with a gas producing organism. Underlying necrotizing fasciitis cannot be excluded. These results were called by telephone at the time of interpretation on 08/06/2016 at 10:48 pm to Dr. Noemi Chapel, who verbally acknowledged these results. Electronically Signed   By: Garald Balding M.D.   On: 08/06/2016 22:48    ROS:  Pertinent items are noted in HPI.  Blood pressure (!) 135/45, pulse 86, temperature 99.3 F (37.4 C), temperature source Oral, resp. rate 18, height '5\' 3"'$  (1.6 m), weight 208 lb 10.9 oz (94.7 kg), SpO2 93 %. Physical Exam: Pleasant unkept white female in no acute distress Head is normocephalic, atraumatic Lungs are clear auscultation with equal breath sounds bilaterally Heart examination reveals a regular rate and rhythm without S3, S4, murmurs Extremity examination reveals bilateral femoral pulses. Right foot with a puncture wound along the plantar surface over the second metatarsal head. No drainage is noted. There is surrounding erythema of the second and third toes with a break in the skin at the base of the second toe. Mild erythema and swelling is noted in the distal right foot. Easily palpable dorsalis pedis and posterior tibial pulses are noted. She does have some sensation to touch in the right foot. Both feet are very dirty.  X-rays of right foot personally reviewed Assessment/Plan: Impression: Cellulitis of right foot with a small amount of gas present. Uncontrolled diabetes mellitus, mild peripheral neuropathy The gas in the right foot may have been introduced from the puncture wound. No need for acute surgical intervention. She is on aspirin and Plavix and this will need to be stopped. Tenderness toxoids injection ordered. Continue IV antibiotics. We'll get MRI of right foot tomorrow. No need for segmental Dopplers as peripheral pulses are easily palpable.  Jacqueline Hansen 08/07/2016, 11:31 AM

## 2016-08-08 ENCOUNTER — Inpatient Hospital Stay (HOSPITAL_COMMUNITY): Payer: Medicare HMO

## 2016-08-08 LAB — BASIC METABOLIC PANEL
Anion gap: 9 (ref 5–15)
BUN: 9 mg/dL (ref 6–20)
CALCIUM: 8.3 mg/dL — AB (ref 8.9–10.3)
CHLORIDE: 103 mmol/L (ref 101–111)
CO2: 24 mmol/L (ref 22–32)
Creatinine, Ser: 0.58 mg/dL (ref 0.44–1.00)
GFR calc non Af Amer: >60 mL/min (ref >60)
GLUCOSE: 188 mg/dL — AB (ref 65–99)
POTASSIUM: 3.7 mmol/L (ref 3.5–5.1)
Sodium: 136 mmol/L (ref 135–145)

## 2016-08-08 LAB — CBC
HEMATOCRIT: 34.9 % — AB (ref 36.0–46.0)
HEMOGLOBIN: 11.7 g/dL — AB (ref 12.0–15.0)
MCH: 29.1 pg (ref 26.0–34.0)
MCHC: 33.5 g/dL (ref 30.0–36.0)
MCV: 86.8 fL (ref 78.0–100.0)
Platelets: 190 10*3/uL (ref 150–400)
RBC: 4.02 MIL/uL (ref 3.87–5.11)
RDW: 12.6 % (ref 11.5–15.5)
WBC: 15 10*3/uL — ABNORMAL HIGH (ref 4.0–10.5)

## 2016-08-08 LAB — GLUCOSE, CAPILLARY
GLUCOSE-CAPILLARY: 187 mg/dL — AB (ref 65–99)
Glucose-Capillary: 245 mg/dL — ABNORMAL HIGH (ref 65–99)
Glucose-Capillary: 130 mg/dL — ABNORMAL HIGH (ref 65–99)
Glucose-Capillary: 108 mg/dL — ABNORMAL HIGH (ref 65–99)

## 2016-08-08 LAB — HEMOGLOBIN A1C
Hgb A1c MFr Bld: 9.2 % — ABNORMAL HIGH (ref 4.8–5.6)
Mean Plasma Glucose: 217 mg/dL

## 2016-08-08 MED ORDER — CHLORHEXIDINE GLUCONATE CLOTH 2 % EX PADS
6.0000 | MEDICATED_PAD | Freq: Once | CUTANEOUS | Status: AC
  Administered 2016-08-08: 6 via TOPICAL

## 2016-08-08 MED ORDER — GADOBENATE DIMEGLUMINE 529 MG/ML IV SOLN
20.0000 mL | Freq: Once | INTRAVENOUS | Status: AC | PRN
  Administered 2016-08-08: 20 mL via INTRAVENOUS

## 2016-08-08 MED ORDER — CHLORHEXIDINE GLUCONATE CLOTH 2 % EX PADS: 6.0000 | MEDICATED_PAD | Freq: Once | CUTANEOUS | Status: AC

## 2016-08-08 NOTE — Care Management Note (Signed)
Case Management Note  Patient Details  Name: Jacqueline Hansen MRN: [id number] Date of Birth: [birth year]  Subjective/Objective:                  Admitted with cellulitis. Pt is from home, living with her daughter in [place]. She has PCP - Dr. Blair Dolphin. Pt ind with ADL's. She uses a cane with ambulation and has walker if she needs it. She has HS oxgyen from [facility]. She has used [facility] in the past and would like them again if needed. Pt having I&D with possible amputation tomorrow.   Action/Plan: Anticipate pt may need to have [place] at DC. CM will follow for DC needs.   Expected Discharge Date:  08/08/16               Expected Discharge Plan:  [place]  In-House Referral:  NA  Discharge planning Services  CM Consult  Status of Service:  In process, will continue to follow  Sherald Barge, RN 08/08/2016, 11:05 AM

## 2016-08-08 NOTE — Progress Notes (Signed)
Subjective: Patient states her right foot feels better. Denies any pain at rest.  Objective: Vital signs in last 24 hours: Temp:  [98 F (36.7 C)-98.6 F (37 C)] 98 F (36.7 C) (06/04 0624) Pulse Rate:  [79-89] 79 (06/04 0624) Resp:  [18] 18 (06/04 0624) BP: (126-145)/(59-66) 138/66 (06/04 0624) SpO2:  [90 %-94 %] 94 % (06/04 0624) Last BM Date: 08/06/16  Intake/Output from previous day: 06/03 0701 - 06/04 0700 In: 2179.2 [P.O.:240; I.V.:1039.2; IV Piggyback:900] Out: -  Intake/Output this shift: No intake/output data recorded.  General appearance: alert, cooperative and no distress Extremities: Bulla has formed at the base of the right second and third toes. Plantar skin cellulitis also noted.  Lab Results:   Recent Labs  08/07/16 0656 08/08/16 0452  WBC 14.4* 15.0*  HGB 11.5* 11.7*  HCT 34.4* 34.9*  PLT 178 190   BMET  Recent Labs  08/07/16 0656 08/08/16 0452  NA 138 136  K 3.8 3.7  CL 104 103  CO2 25 24  GLUCOSE 251* 188*  BUN 8 9  CREATININE 0.55 0.58  CALCIUM 8.4* 8.3*   PT/INR No results for input(s): LABPROT, INR in the last 72 hours.  Studies/Results: Dg Foot 2 Views Right  Result Date: 08/06/2016 CLINICAL DATA:  Acute onset of right foot swelling and erythema. Right foot pain. Tack removed from plantar surface of foot 2 days ago. Initial encounter. EXAM: RIGHT FOOT - 2 VIEW COMPARISON:  None. FINDINGS: There is no evidence of fracture or dislocation. No definite osseous erosion is seen, though evaluation for osteomyelitis is limited on radiograph. The joint spaces are preserved. There is no evidence of talar subluxation; the subtalar joint is unremarkable in appearance. Diffuse soft tissue swelling is noted about the forefoot and midfoot. Prominent soft tissue air is seen tracking about the plantar aspect of the forefoot, between the second and third metatarsophalangeal joints. IMPRESSION: 1. No evidence fracture or dislocation. 2. No definite  osseous erosion seen, though evaluation for osteomyelitis is limited on radiograph,. If there is significant clinical concern for osteomyelitis, MRI of the right foot could be considered for further evaluation. 3. Prominent soft tissue air tracking about the plantar aspect of the forefoot, between the second and third metatarsophalangeal joints. This raises concern for infection with a gas producing organism. Underlying necrotizing fasciitis cannot be excluded. These results were called by telephone at the time of interpretation on 08/06/2016 at 10:48 pm to Dr. Noemi Chapel, who verbally acknowledged these results. Electronically Signed   By: Garald Balding M.D.   On: 08/06/2016 22:48    Anti-infectives: Anti-infectives    Start     Dose/Rate Route Frequency Ordered Stop   08/07/16 1000  vancomycin (VANCOCIN) IVPB 1000 mg/200 mL premix     1,000 mg 200 mL/hr over 60 Minutes Intravenous Every 12 hours 08/07/16 0956     08/07/16 1000  piperacillin-tazobactam (ZOSYN) IVPB 3.375 g     3.375 g 12.5 mL/hr over 240 Minutes Intravenous Every 8 hours 08/07/16 0956     08/07/16 0300  clindamycin (CLEOCIN) IVPB 600 mg     600 mg 100 mL/hr over 30 Minutes Intravenous Every 8 hours 08/07/16 0202     08/07/16 0000  piperacillin-tazobactam (ZOSYN) IVPB 3.375 g     3.375 g 12.5 mL/hr over 240 Minutes Intravenous  Once 08/06/16 2343 08/07/16 0625   08/06/16 2000  vancomycin (VANCOCIN) IVPB 1000 mg/200 mL premix     1,000 mg 200 mL/hr over 60 Minutes Intravenous  Once 08/06/16 1949 08/07/16 4536      Assessment/Plan: Impression: Cellulitis of right foot secondary to diabetes mellitus, trauma secondary to puncture by foreign body Plan: Patient to get MRI of right foot today. Further management pending those results. Patient is scheduled for incision and drainage of the right foot with possible second and third toe amputations tomorrow. The risks and benefits of the procedure were fully explained to the patient,  who gave informed consent.  LOS: 1 day    Aviva Signs 08/08/2016

## 2016-08-08 NOTE — Progress Notes (Signed)
PROGRESS NOTE    Jacqueline Hansen  JAS:[id number] DOB: 1940-05-29 DOA: 08/06/2016 PCP: Keane Police, MD     Brief Narrative:  76 y/o woman admitted from home on 6/2 for RLE cellulitis. This began after she stepped on a tack 3 days PTA. Dr. Arnoldo Morale is following. Admission was requested.   Assessment & Plan:   Active Problems:   DM (diabetes mellitus) ([place])   PAD (peripheral artery disease) ([facility])   Cellulitis   Cellulitis and abscess of foot   Cellulitis of right Foot -Following a puncture injury. -Agree with tetanus prophylaxis and MRI to further evaluate extent of disease. -Continue broad spectrum abx therapy. -Plan for OR in am. Extent of surgery will depend on MRI results.  DM -CBGs a little elevated, altho improved after increase in lantus. -Will not make further changes for today. -continue to monitor and adjust regimen as needed.  Hyperlipidemia -Continue statin  COPD -Stable.  PAD -Continue ASA, plavix will be held in case surgery required.   DVT prophylaxis: lovenox Code Status: full code Family Communication: patient only Disposition Plan: OR in am  Consultants:   Surgery, Dr. Arnoldo Morale  Procedures:   None  Antimicrobials:  Anti-infectives    Start     Dose/Rate Route Frequency Ordered Stop   08/07/16 1000  vancomycin (VANCOCIN) IVPB 1000 mg/200 mL premix     1,000 mg 200 mL/hr over 60 Minutes Intravenous Every 12 hours 08/07/16 0956     08/07/16 1000  piperacillin-tazobactam (ZOSYN) IVPB 3.375 g     3.375 g 12.5 mL/hr over 240 Minutes Intravenous Every 8 hours 08/07/16 0956     08/07/16 0300  clindamycin (CLEOCIN) IVPB 600 mg     600 mg 100 mL/hr over 30 Minutes Intravenous Every 8 hours 08/07/16 0202     08/07/16 0000  piperacillin-tazobactam (ZOSYN) IVPB 3.375 g     3.375 g 12.5 mL/hr over 240 Minutes Intravenous  Once 08/06/16 2343 08/07/16 0625   08/06/16 2000  vancomycin (VANCOCIN) IVPB 1000 mg/200 mL premix     1,000 mg 200 mL/hr over  60 Minutes Intravenous  Once 08/06/16 1949 08/07/16 0626       Subjective: Is concerned about anesthesia. Says she had a difficult time "waking up" when she had her GB removed in the past.  Objective: Vitals:   08/07/16 1912 08/07/16 2202 08/08/16 0624 08/08/16 0832  BP:  (!) 145/59 138/66   Pulse:  89 79   Resp:  18 18   Temp:  98.2 F (36.8 C) 98 F (36.7 C)   TempSrc:  Oral Oral   SpO2: 92% 93% 94% (!) 83%  Weight:      Height:        Intake/Output Summary (Last 24 hours) at 08/08/16 1112 Last data filed at 08/08/16 0900  Gross per 24 hour  Intake          2299.17 ml  Output                0 ml  Net          2299.17 ml   Filed Weights   08/06/16 1919 08/07/16 0123  Weight: 94.3 kg (208 lb) 94.7 kg (208 lb 10.9 oz)    Examination:   General exam: Alert, awake, oriented x 3 Respiratory system: Clear to auscultation. Respiratory effort normal. Cardiovascular system:RRR. No murmurs, rubs, gallops. Gastrointestinal system: Abdomen is nondistended, soft and nontender. No organomegaly or masses felt. Normal bowel sounds heard. Central nervous system: Alert  and oriented. No focal neurological deficits. Extremities: edema and erythema extending from the toes all the up to the mid-shin on the right. Appears to be a puncture hole between the second and third toes. Skin: No rashes, lesions or ulcers Psychiatry: Judgement and insight appear normal. Mood & affect appropriate.      Data Reviewed: I have personally reviewed following labs and imaging studies  CBC:  Recent Labs Lab 08/06/16 2010 08/07/16 0656 08/08/16 0452  WBC 14.5* 14.4* 15.0*  NEUTROABS 11.4*  --   --   HGB 12.3 11.5* 11.7*  HCT 36.1 34.4* 34.9*  MCV 86.6 86.6 86.8  PLT 158 178 619   Basic Metabolic Panel:  Recent Labs Lab 08/06/16 2010 08/07/16 0656 08/08/16 0452  NA 138 138 136  K 3.2* 3.8 3.7  CL 102 104 103  CO2 27 25 24   GLUCOSE 299* 251* 188*  BUN 13 8 9   CREATININE 0.78 0.55  0.58  CALCIUM 8.9 8.4* 8.3*   GFR: Estimated Creatinine Clearance: 67.5 mL/min (by C-G formula based on SCr of 0.58 mg/dL). Liver Function Tests:  Recent Labs Lab 08/07/16 0656  AST 9*  ALT 10*  ALKPHOS 55  BILITOT 0.7  PROT 6.1*  ALBUMIN 2.5*   No results for input(s): LIPASE, AMYLASE in the last 168 hours. No results for input(s): AMMONIA in the last 168 hours. Coagulation Profile: No results for input(s): INR, PROTIME in the last 168 hours. Cardiac Enzymes: No results for input(s): CKTOTAL, CKMB, CKMBINDEX, TROPONINI in the last 168 hours. BNP (last 3 results) No results for input(s): PROBNP in the last 8760 hours. HbA1C:  Recent Labs  08/06/16 2010  HGBA1C 9.2*   CBG:  Recent Labs Lab 08/07/16 0729 08/07/16 1311 08/07/16 1655 08/07/16 2202 08/08/16 0729  GLUCAP 233* 180* 159* 174* 187*   Lipid Profile: No results for input(s): CHOL, HDL, LDLCALC, TRIG, CHOLHDL, LDLDIRECT in the last 72 hours. Thyroid Function Tests: No results for input(s): TSH, T4TOTAL, FREET4, T3FREE, THYROIDAB in the last 72 hours. Anemia Panel: No results for input(s): VITAMINB12, FOLATE, FERRITIN, TIBC, IRON, RETICCTPCT in the last 72 hours. Urine analysis: No results found for: COLORURINE, APPEARANCEUR, LABSPEC, PHURINE, GLUCOSEU, HGBUR, BILIRUBINUR, KETONESUR, PROTEINUR, UROBILINOGEN, NITRITE, LEUKOCYTESUR Sepsis Labs: @LABRCNTIP (procalcitonin:4,lacticidven:4)  ) Recent Results (from the past 240 hour(s))  MRSA PCR Screening     Status: None   Collection Time: 08/07/16 12:45 AM  Result Value Ref Range Status   MRSA by PCR NEGATIVE NEGATIVE Final    Comment:        The GeneXpert MRSA Assay (FDA approved for NASAL specimens only), is one component of a comprehensive MRSA colonization surveillance program. It is not intended to diagnose MRSA infection nor to guide or monitor treatment for MRSA infections.          Radiology Studies: Dg Foot 2 Views Right  Result  Date: 08/06/2016 CLINICAL DATA:  Acute onset of right foot swelling and erythema. Right foot pain. Tack removed from plantar surface of foot 2 days ago. Initial encounter. EXAM: RIGHT FOOT - 2 VIEW COMPARISON:  None. FINDINGS: There is no evidence of fracture or dislocation. No definite osseous erosion is seen, though evaluation for osteomyelitis is limited on radiograph. The joint spaces are preserved. There is no evidence of talar subluxation; the subtalar joint is unremarkable in appearance. Diffuse soft tissue swelling is noted about the forefoot and midfoot. Prominent soft tissue air is seen tracking about the plantar aspect of the forefoot, between the second  and third metatarsophalangeal joints. IMPRESSION: 1. No evidence fracture or dislocation. 2. No definite osseous erosion seen, though evaluation for osteomyelitis is limited on radiograph,. If there is significant clinical concern for osteomyelitis, MRI of the right foot could be considered for further evaluation. 3. Prominent soft tissue air tracking about the plantar aspect of the forefoot, between the second and third metatarsophalangeal joints. This raises concern for infection with a gas producing organism. Underlying necrotizing fasciitis cannot be excluded. These results were called by telephone at the time of interpretation on 08/06/2016 at 10:48 pm to Dr. Noemi Chapel, who verbally acknowledged these results. Electronically Signed   By: Garald Balding M.D.   On: 08/06/2016 22:48        Scheduled Meds: . DULoxetine  60 mg Oral BID  . enoxaparin (LOVENOX) injection  40 mg Subcutaneous Q24H  . insulin aspart  0-9 Units Subcutaneous TID WC  . insulin glargine  53 Units Subcutaneous QHS  . meclizine  12.5 mg Oral TID  . midodrine  5 mg Oral TID AC  . mometasone-formoterol  2 puff Inhalation BID  . pantoprazole  40 mg Oral Daily  . rosuvastatin  40 mg Oral Daily   Continuous Infusions: . sodium chloride 50 mL/hr at 08/08/16 0949  .  clindamycin (CLEOCIN) IV Stopped (08/08/16 0402)  . piperacillin-tazobactam (ZOSYN)  IV 3.375 g (08/08/16 0950)  . vancomycin 1,000 mg (08/08/16 0954)     LOS: 1 day    Time spent: 25 minutes. Greater than 50% of this time was spent in direct contact with the patient coordinating care.     Lelon Frohlich, MD [facility] Pager [phone]  If 7PM-7AM, please contact night-coverage [email or web address] Password [email or web address] 08/08/2016, 11:12 AM

## 2016-08-09 ENCOUNTER — Inpatient Hospital Stay (HOSPITAL_COMMUNITY): Payer: Medicare HMO | Admitting: Anesthesiology

## 2016-08-09 ENCOUNTER — Encounter (HOSPITAL_COMMUNITY): Payer: Self-pay | Admitting: *Deleted

## 2016-08-09 ENCOUNTER — Encounter (HOSPITAL_COMMUNITY)
Admission: EM | Disposition: A | Discharge status: Home Health | Payer: Self-pay | Source: Home / Self Care | Attending: Internal Medicine

## 2016-08-09 HISTORY — PX: INCISION AND DRAINAGE ABSCESS: SHX5864

## 2016-08-09 LAB — GLUCOSE, CAPILLARY
GLUCOSE-CAPILLARY: 76 mg/dL (ref 65–99)
GLUCOSE-CAPILLARY: 91 mg/dL (ref 65–99)
GLUCOSE-CAPILLARY: 76 mg/dL (ref 65–99)
GLUCOSE-CAPILLARY: 167 mg/dL — AB (ref 65–99)
GLUCOSE-CAPILLARY: 67 mg/dL (ref 65–99)
Glucose-Capillary: 124 mg/dL — ABNORMAL HIGH (ref 65–99)

## 2016-08-09 LAB — CBC
HEMATOCRIT: 33.3 % — AB (ref 36.0–46.0)
Hemoglobin: 11.2 g/dL — ABNORMAL LOW (ref 12.0–15.0)
MCH: 29.2 pg (ref 26.0–34.0)
MCHC: 33.6 g/dL (ref 30.0–36.0)
MCV: 86.9 fL (ref 78.0–100.0)
Platelets: 194 10*3/uL (ref 150–400)
RBC: 3.83 MIL/uL — ABNORMAL LOW (ref 3.87–5.11)
RDW: 12.8 % (ref 11.5–15.5)
WBC: 13.9 10*3/uL — AB (ref 4.0–10.5)

## 2016-08-09 SURGERY — INCISION AND DRAINAGE, ABSCESS
Anesthesia: General | Site: Foot | Laterality: Right

## 2016-08-09 MED ORDER — FENTANYL CITRATE (PF) 100 MCG/2ML IJ SOLN
INTRAMUSCULAR | Status: AC
  Filled 2016-08-09: qty 2

## 2016-08-09 MED ORDER — ONDANSETRON HCL 4 MG/2ML IJ SOLN
4.0000 mg | Freq: Once | INTRAMUSCULAR | Status: AC
  Administered 2016-08-09: 4 mg via INTRAVENOUS

## 2016-08-09 MED ORDER — KETOROLAC TROMETHAMINE 30 MG/ML IJ SOLN
INTRAMUSCULAR | Status: AC
  Filled 2016-08-09: qty 1

## 2016-08-09 MED ORDER — DEXTROSE 50 % IV SOLN
INTRAVENOUS | Status: AC
  Filled 2016-08-09: qty 50

## 2016-08-09 MED ORDER — 0.9 % SODIUM CHLORIDE (POUR BTL) OPTIME
TOPICAL | Status: DC | PRN
  Administered 2016-08-09: 1000 mL

## 2016-08-09 MED ORDER — LIDOCAINE HCL (PF) 1 % IJ SOLN
INTRAMUSCULAR | Status: AC
  Filled 2016-08-09: qty 5

## 2016-08-09 MED ORDER — PROPOFOL 500 MG/50ML IV EMUL
INTRAVENOUS | Status: DC | PRN
  Administered 2016-08-09: 35 ug/kg/min via INTRAVENOUS

## 2016-08-09 MED ORDER — KETOROLAC TROMETHAMINE 30 MG/ML IJ SOLN
30.0000 mg | Freq: Once | INTRAMUSCULAR | Status: AC
  Administered 2016-08-09: 30 mg via INTRAVENOUS

## 2016-08-09 MED ORDER — IPRATROPIUM-ALBUTEROL 0.5-2.5 (3) MG/3ML IN SOLN
3.0000 mL | Freq: Once | RESPIRATORY_TRACT | Status: AC
  Administered 2016-08-09: 3 mL via RESPIRATORY_TRACT

## 2016-08-09 MED ORDER — LACTATED RINGERS IV SOLN
INTRAVENOUS | Status: DC
  Administered 2016-08-09: 1000 mL via INTRAVENOUS

## 2016-08-09 MED ORDER — FENTANYL CITRATE (PF) 100 MCG/2ML IJ SOLN: 25.0000 ug | INTRAMUSCULAR | Status: DC | PRN

## 2016-08-09 MED ORDER — DEXTROSE 50 % IV SOLN
25.0000 mL | Freq: Once | INTRAVENOUS | Status: AC
  Administered 2016-08-09: 25 mL via INTRAVENOUS

## 2016-08-09 MED ORDER — HYDROCODONE-ACETAMINOPHEN 5-325 MG PO TABS
1.0000 | ORAL_TABLET | ORAL | Status: DC | PRN
  Administered 2016-08-10 – 2016-08-11 (×4): 2 via ORAL
  Filled 2016-08-09 (×4): qty 2

## 2016-08-09 MED ORDER — ALBUTEROL SULFATE HFA 108 (90 BASE) MCG/ACT IN AERS
INHALATION_SPRAY | RESPIRATORY_TRACT | Status: AC
  Filled 2016-08-09: qty 6.7

## 2016-08-09 MED ORDER — PROPOFOL 10 MG/ML IV BOLUS
INTRAVENOUS | Status: AC
  Filled 2016-08-09: qty 20

## 2016-08-09 MED ORDER — SUCCINYLCHOLINE CHLORIDE 20 MG/ML IJ SOLN
INTRAMUSCULAR | Status: AC
  Filled 2016-08-09: qty 1

## 2016-08-09 MED ORDER — ALBUTEROL SULFATE HFA 108 (90 BASE) MCG/ACT IN AERS
INHALATION_SPRAY | RESPIRATORY_TRACT | Status: DC | PRN
Start: 2016-08-09 — End: 2016-08-09
  Administered 2016-08-09 (×2): 4 via RESPIRATORY_TRACT

## 2016-08-09 MED ORDER — LACTATED RINGERS IV SOLN
INTRAVENOUS | Status: DC
  Administered 2016-08-09: 13:00:00 via INTRAVENOUS

## 2016-08-09 MED ORDER — BUPIVACAINE IN DEXTROSE 0.75-8.25 % IT SOLN
INTRATHECAL | Status: AC
  Filled 2016-08-09: qty 2

## 2016-08-09 MED ORDER — LIDOCAINE HCL 1 % IJ SOLN
INTRAMUSCULAR | Status: DC | PRN
  Administered 2016-08-09: 40 mg via INTRADERMAL

## 2016-08-09 MED ORDER — SODIUM CHLORIDE 0.9 % IR SOLN
Status: DC | PRN
  Administered 2016-08-09: 3000 mL

## 2016-08-09 MED ORDER — PROPOFOL 10 MG/ML IV BOLUS
INTRAVENOUS | Status: DC | PRN
  Administered 2016-08-09 (×2): 10 mg via INTRAVENOUS

## 2016-08-09 MED ORDER — MIDAZOLAM HCL 2 MG/2ML IJ SOLN
INTRAMUSCULAR | Status: AC
  Filled 2016-08-09: qty 2

## 2016-08-09 MED ORDER — FENTANYL CITRATE (PF) 100 MCG/2ML IJ SOLN
INTRAMUSCULAR | Status: DC | PRN
  Administered 2016-08-09: 50 ug via INTRAVENOUS

## 2016-08-09 MED ORDER — ONDANSETRON 4 MG PO TBDP: 4.0000 mg | ORAL_TABLET | Freq: Four times a day (QID) | ORAL | Status: DC | PRN

## 2016-08-09 MED ORDER — MIDAZOLAM HCL 2 MG/2ML IJ SOLN
1.0000 mg | INTRAMUSCULAR | Status: AC
  Administered 2016-08-09 (×2): 1 mg via INTRAVENOUS

## 2016-08-09 MED ORDER — SODIUM CHLORIDE 0.9% FLUSH
INTRAVENOUS | Status: AC
  Filled 2016-08-09: qty 10

## 2016-08-09 MED ORDER — IPRATROPIUM-ALBUTEROL 0.5-2.5 (3) MG/3ML IN SOLN
RESPIRATORY_TRACT | Status: AC
  Filled 2016-08-09: qty 3

## 2016-08-09 MED ORDER — ONDANSETRON HCL 4 MG/2ML IJ SOLN: 4.0000 mg | Freq: Four times a day (QID) | INTRAMUSCULAR | Status: DC | PRN

## 2016-08-09 MED ORDER — ALBUTEROL SULFATE (2.5 MG/3ML) 0.083% IN NEBU: 2.5000 mg | INHALATION_SOLUTION | RESPIRATORY_TRACT | Status: DC | PRN

## 2016-08-09 MED ORDER — ONDANSETRON HCL 4 MG/2ML IJ SOLN
INTRAMUSCULAR | Status: AC
  Filled 2016-08-09: qty 2

## 2016-08-09 SURGICAL SUPPLY — 28 items
BAG HAMPER (MISCELLANEOUS) ×2 IMPLANT
BANDAGE ELASTIC 3 LF NS (GAUZE/BANDAGES/DRESSINGS) ×2 IMPLANT
BNDG CONFORM 2 STRL LF (GAUZE/BANDAGES/DRESSINGS) ×2 IMPLANT
BNDG GAUZE ELAST 4 BULKY (GAUZE/BANDAGES/DRESSINGS) ×2 IMPLANT
CLOTH BEACON ORANGE TIMEOUT ST (SAFETY) ×2 IMPLANT
COVER LIGHT HANDLE STERIS (MISCELLANEOUS) ×4 IMPLANT
ELECT REM PT RETURN 9FT ADLT (ELECTROSURGICAL) ×2
ELECTRODE REM PT RTRN 9FT ADLT (ELECTROSURGICAL) ×1 IMPLANT
GAUZE IODOFORM PACK 1/2 7832 (GAUZE/BANDAGES/DRESSINGS) ×2 IMPLANT
GAUZE SPONGE 4X4 12PLY STRL (GAUZE/BANDAGES/DRESSINGS) ×2 IMPLANT
GLOVE BIOGEL M STRL SZ7.5 (GLOVE) ×2 IMPLANT
GLOVE BIOGEL PI IND STRL 7.0 (GLOVE) ×1 IMPLANT
GLOVE BIOGEL PI INDICATOR 7.0 (GLOVE) ×1
GLOVE SURG SS PI 7.5 STRL IVOR (GLOVE) ×2 IMPLANT
GOWN STRL REUS W/TWL LRG LVL3 (GOWN DISPOSABLE) ×4 IMPLANT
IV NS IRRIG 3000ML ARTHROMATIC (IV SOLUTION) ×2 IMPLANT
KIT ROOM TURNOVER APOR (KITS) ×2 IMPLANT
MANIFOLD NEPTUNE II (INSTRUMENTS) ×2 IMPLANT
MARKER SKIN DUAL TIP RULER LAB (MISCELLANEOUS) ×2 IMPLANT
NS IRRIG 1000ML POUR BTL (IV SOLUTION) ×2 IMPLANT
PACK BASIC LIMB (CUSTOM PROCEDURE TRAY) IMPLANT
PACK MINOR (CUSTOM PROCEDURE TRAY) ×2 IMPLANT
PAD ABD 5X9 TENDERSORB (GAUZE/BANDAGES/DRESSINGS) IMPLANT
PAD ARMBOARD 7.5X6 YLW CONV (MISCELLANEOUS) ×2 IMPLANT
SET BASIN LINEN APH (SET/KITS/TRAYS/PACK) ×2 IMPLANT
SWAB CULTURE LIQ STUART DBL (MISCELLANEOUS) ×2 IMPLANT
SYR BULB IRRIGATION 50ML (SYRINGE) ×2 IMPLANT
TUBE ANAEROBIC PORT A CUL  W/M (MISCELLANEOUS) ×2 IMPLANT

## 2016-08-09 NOTE — Anesthesia Preprocedure Evaluation (Signed)
Anesthesia Evaluation  Patient identified by MRN, date of birth, ID band Patient awake    Reviewed: Allergy & Precautions, NPO status , Patient's Chart, lab work & pertinent test results  Airway Mallampati: IV  TM Distance: >3 FB Neck ROM: Full    Dental  (+) Edentulous Upper, Edentulous Lower   Pulmonary asthma , COPD,  COPD inhaler and oxygen dependent, Current Smoker,   Inhaler this am  breath sounds clear to auscultation       Cardiovascular + Peripheral Vascular Disease and +CHF   Rhythm:Regular Rate:Normal     Neuro/Psych Seizures -, Well Controlled,  PSYCHIATRIC DISORDERS Anxiety Depression    GI/Hepatic hiatal hernia, PUD, GERD  ,  Endo/Other  diabetes, Type 2, Insulin Dependent  Renal/GU      Musculoskeletal  (+) Arthritis , Fibromyalgia -  Abdominal   Peds  Hematology   Anesthesia Other Findings   Reproductive/Obstetrics                             Anesthesia Physical Anesthesia Plan  ASA: IV  Anesthesia Plan: Spinal   Post-op Pain Management:    Induction:   PONV Risk Score and Plan:   Airway Management Planned: Simple Face Mask  Additional Equipment:   Intra-op Plan:   Post-operative Plan:   Informed Consent: I have reviewed the patients History and Physical, chart, labs and discussed the procedure including the risks, benefits and alternatives for the proposed anesthesia with the patient or authorized representative who has indicated his/her understanding and acceptance.     Plan Discussed with:   Anesthesia Plan Comments:         Anesthesia Quick Evaluation

## 2016-08-09 NOTE — Op Note (Signed)
Patient:  Jacqueline Hansen  DOB:  June 02, 1940  MRN:  [id number]   Preop Diagnosis:  Cellulitis with abscess, right foot  Postop Diagnosis:  Same  Procedure:  Incision and drainage, right foot  Surgeon:  Aviva Signs, M.D.  Anes:  Gen.  Indications:  Patient is a 76 year old white female with uncontrolled diabetes mellitus who sustained a puncture wound to her right foot. This has advanced into the abscess with necrotic tissue present. The patient now presents to the operating room for incision and drainage of the right foot abscess. The risks and benefits of the procedure were fully explained to the patient, who gave informed consent.  Procedure note:  The patient was placed the supine position. After general anesthesia was administered, the right foot was prepped and draped using usual sterile technique with Betadine. Surgical site confirmation was performed.  An incision was made dorsally between the second and third toes. This was taken down to the soft tissue. Aerobic and anaerobic cultures were taken and sent to microbiology. Necrotic tissue was present. Pulse lavage therapy was used to clean the wound. In addition, a small incision was made at the puncture wound site along the plantar surface of the right foot. Necrotic soft tissue was found. This was also treated with pulse lavage therapy. Once the wound was cleaned, 1/2 inch iodoform Nu Gauze was placed for packing. A Kerlix and Ace wrap were then applied.  All tape and needle counts were correct at the end of the procedure. The patient was awakened and transferred to PACU in stable condition.  Complications:  None  EBL:  Minimal  Specimen:  Aerobic and anaerobic cultures of right foot abscess

## 2016-08-09 NOTE — Anesthesia Postprocedure Evaluation (Signed)
Anesthesia Post Note  Patient: Jacqueline Hansen  Procedure(s) Performed: Procedure(s) (LRB): INCISION AND DRAINAGE ABSCESS (Right)  Patient location during evaluation: PACU Anesthesia Type: General Level of consciousness: awake and alert and oriented Pain management: pain level controlled Vital Signs Assessment: post-procedure vital signs reviewed and stable Respiratory status: spontaneous breathing Cardiovascular status: stable Postop Assessment: no signs of nausea or vomiting Anesthetic complications: no     Last Vitals:  Vitals:   08/09/16 0945 08/09/16 1000  BP: (!) 130/96 (!) 134/94  Pulse: 90 98  Resp: (!) 23 19  Temp:  36.7 C    Last Pain:  Vitals:   08/09/16 0930  TempSrc:   PainSc: 0-No pain                 [name]

## 2016-08-09 NOTE — Interval H&P Note (Signed)
History and Physical Interval Note:  08/09/2016 7:12 AM  Jacqueline Hansen  has presented today for surgery, with the diagnosis of cellulitis, right foot  The various methods of treatment have been discussed with the patient and family. After consideration of risks, benefits and other options for treatment, the patient has consented to  Procedure(s): INCISION AND DRAINAGE ABSCESS (Right) as a surgical intervention .  The patient's history has been reviewed, patient examined, no change in status, stable for surgery.  I have reviewed the patient's chart and labs.  Questions were answered to the patient's satisfaction.     Aviva Signs

## 2016-08-09 NOTE — Anesthesia Procedure Notes (Signed)
Procedure Name: Intubation Date/Time: 08/09/2016 8:23 AM Performed by: Tressie Stalker E Pre-anesthesia Checklist: Patient identified, Patient being monitored, Timeout performed, Emergency Drugs available and Suction available Patient Re-evaluated:Patient Re-evaluated prior to inductionOxygen Delivery Method: Circle system utilized Preoxygenation: Pre-oxygenation with 100% oxygen Intubation Type: IV induction Ventilation: Mask ventilation without difficulty Laryngoscope Size: Mac and 3 Grade View: Grade I Tube type: Oral Tube size: 7.0 mm Number of attempts: 1 Airway Equipment and Method: Stylet Placement Confirmation: ETT inserted through vocal cords under direct vision,  positive ETCO2 and breath sounds checked- equal and bilateral Secured at: 21 cm Tube secured with: Tape Dental Injury: Teeth and Oropharynx as per pre-operative assessment

## 2016-08-09 NOTE — Transfer of Care (Signed)
Immediate Anesthesia Transfer of Care Note  Patient: Jacqueline Hansen  Procedure(s) Performed: Procedure(s): INCISION AND DRAINAGE ABSCESS (Right)  Patient Location: PACU  Anesthesia Type:General  Level of Consciousness: awake, alert  and oriented  Airway & Oxygen Therapy: Patient Spontanous Breathing and Patient connected to face mask oxygen  Post-op Assessment: Report given to RN  Post vital signs: Reviewed and stable  Last Vitals:  Vitals:   08/09/16 0740 08/09/16 0904  BP: 120/82 134/68  Pulse:  94  Resp: 18 18  Temp:  36.7 C    Last Pain:  Vitals:   08/09/16 0646  TempSrc: Oral  PainSc: 6       Patients Stated Pain Goal: 8 (91/44/45 8483)  Complications: No apparent anesthesia complications

## 2016-08-09 NOTE — Progress Notes (Signed)
Pharmacy Antibiotic Note  Jacqueline Hansen is a 76 y.o. female admitted on 08/06/2016 with cellulitis.  Pharmacy has been consulted for Zosyn dosing.  Plan: Zosyn 3.375gm IV q8h, EID  Also on Clindamycin 600mg  IV q8h, concerned for necrotizing fascitis F/U cxs and clinical progress Monitor V/S, labs, and levels as indicated  Height: 5\' 3"  (160 cm) Weight: 208 lb (94.3 kg) IBW/kg (Calculated) : 52.4  Temp (24hrs), Avg:98.3 F (36.8 C), Min:97.9 F (36.6 C), Max:99.3 F (37.4 C)   Recent Labs Lab 08/06/16 2010 08/07/16 0656 08/08/16 0452 08/09/16 0446  WBC 14.5* 14.4* 15.0* 13.9*  CREATININE 0.78 0.55 0.58  --     Estimated Creatinine Clearance: 67.4 mL/min (by C-G formula based on SCr of 0.58 mg/dL).    Allergies  Allergen Reactions  . Adhesive [Tape] Other (See Comments)    Reaction:  Tears pts skin    Antimicrobials this admission: Vancomcyin 6/2 >> 6/5 Zosyn 6/3  >>  Clindamycin 6/3 >>  Dose adjustments this admission: N/a  Microbiology results: 6/3  MRSA PCR: negative 6/5: surgical abscess Cx: pending  Thank you for allowing pharmacy to be a part of this patient's care.  Hart Robinsons, PharmD Clinical Pharmacist Pager:  [phone] 08/09/2016   08/09/2016 11:16 AM

## 2016-08-09 NOTE — Progress Notes (Signed)
PROGRESS NOTE    Jacqueline Hansen  HCW:[id number] DOB: June 20, 1940 DOA: 08/06/2016 PCP: Keane Police, MD     Brief Narrative:  76 y/o woman admitted from home on 6/2 for RLE cellulitis. This began after she stepped on a tack 3 days PTA. Dr. Arnoldo Morale is following. Admission was requested. MRI without osteomyelitis. S/p OR for I and D on 6/5.   Assessment & Plan:   Active Problems:   DM (diabetes mellitus) ([place])   PAD (peripheral artery disease) ([facility])   Cellulitis   Cellulitis and abscess of foot   Cellulitis of right Foot -Following a puncture injury. -Received tetanus prophylaxis. -MRI: 1. No evidence of osteomyelitis of the right forefoot. 2. Cellulitis of the midfoot and forefoot. 3. Focal area of nonenhancing tissue between the second and third metatarsal heads and toes measuring approximately 2.6 x 3.2 x 2.8 cm with interspersed areas rounded low signal foci likely reflecting air most concerning for necrotic tissue. -Continue broad spectrum abx therapy (Vanc and zosyn). Will DC clinda. -Further abx to be guided by OP cultures.  DM -CBGs much improved after increase in lantus dose. -Will not make further changes for today. -continue to monitor and adjust regimen as needed.  Hyperlipidemia -Continue statin  COPD -Stable.  PAD -ASA, plavix on hold. Resume once ok with Dr. Arnoldo Morale.   DVT prophylaxis: lovenox Code Status: full code Family Communication: patient only Disposition Plan: pending surgical recommendations  Consultants:   Surgery, Dr. Arnoldo Morale  Procedures:   None  Antimicrobials:  Anti-infectives    Start     Dose/Rate Route Frequency Ordered Stop   08/07/16 1000  vancomycin (VANCOCIN) IVPB 1000 mg/200 mL premix  Status:  Discontinued     1,000 mg 200 mL/hr over 60 Minutes Intravenous Every 12 hours 08/07/16 0956 08/09/16 1115   08/07/16 1000  piperacillin-tazobactam (ZOSYN) IVPB 3.375 g     3.375 g 12.5 mL/hr over 240 Minutes Intravenous Every  8 hours 08/07/16 0956     08/07/16 0300  clindamycin (CLEOCIN) IVPB 600 mg     600 mg 100 mL/hr over 30 Minutes Intravenous Every 8 hours 08/07/16 0202     08/07/16 0000  piperacillin-tazobactam (ZOSYN) IVPB 3.375 g     3.375 g 12.5 mL/hr over 240 Minutes Intravenous  Once 08/06/16 2343 08/07/16 0625   08/06/16 2000  vancomycin (VANCOCIN) IVPB 1000 mg/200 mL premix     1,000 mg 200 mL/hr over 60 Minutes Intravenous  Once 08/06/16 1949 08/07/16 0626       Subjective: Very sleepy today. I have assessed her after she has gone to surgery so this is likely post-anesthesia effect.  Objective: Vitals:   08/09/16 1045 08/09/16 1145 08/09/16 1245 08/09/16 1345  BP: 132/84 (!) 116/59 (!) 114/57 (!) 106/47  Pulse: 64 90 88 81  Resp:  18 18 18   Temp: 97.9 F (36.6 C) 98.5 F (36.9 C) 98.6 F (37 C) 98.1 F (36.7 C)  TempSrc: Oral Oral Oral Oral  SpO2: 100% 98% 95% 100%  Weight:      Height:        Intake/Output Summary (Last 24 hours) at 08/09/16 1407 Last data filed at 08/09/16 1007  Gross per 24 hour  Intake           2267.5 ml  Output              560 ml  Net           1707.5 ml   [employer]  08/06/16 1919 08/07/16 0123 08/09/16 0646  Weight: 94.3 kg (208 lb) 94.7 kg (208 lb 10.9 oz) 94.3 kg (208 lb)    Examination:   General exam: Drowsy, confused Respiratory system: Clear to auscultation. Respiratory effort normal. Cardiovascular system:RRR. No murmurs, rubs, gallops. Gastrointestinal system: Abdomen is nondistended, soft and nontender. No organomegaly or masses felt. Normal bowel sounds heard. Central nervous system: Alert and oriented. No focal neurological deficits. Extremities: right foot wrapped in clean ACE bandage. Have not unwrapped. Psychiatry: Unable to fully assess today given current mental state.      Data Reviewed: I have personally reviewed following labs and imaging studies  CBC:  Recent Labs Lab 08/06/16 2010 08/07/16 0656  08/08/16 0452 08/09/16 0446  WBC 14.5* 14.4* 15.0* 13.9*  NEUTROABS 11.4*  --   --   --   HGB 12.3 11.5* 11.7* 11.2*  HCT 36.1 34.4* 34.9* 33.3*  MCV 86.6 86.6 86.8 86.9  PLT 158 178 190 115   Basic Metabolic Panel:  Recent Labs Lab 08/06/16 2010 08/07/16 0656 08/08/16 0452  NA 138 138 136  K 3.2* 3.8 3.7  CL 102 104 103  CO2 27 25 24   GLUCOSE 299* 251* 188*  BUN 13 8 9   CREATININE 0.78 0.55 0.58  CALCIUM 8.9 8.4* 8.3*   GFR: Estimated Creatinine Clearance: 67.4 mL/min (by C-G formula based on SCr of 0.58 mg/dL). Liver Function Tests:  Recent Labs Lab 08/07/16 0656  AST 9*  ALT 10*  ALKPHOS 55  BILITOT 0.7  PROT 6.1*  ALBUMIN 2.5*   No results for input(s): LIPASE, AMYLASE in the last 168 hours. No results for input(s): AMMONIA in the last 168 hours. Coagulation Profile: No results for input(s): INR, PROTIME in the last 168 hours. Cardiac Enzymes: No results for input(s): CKTOTAL, CKMB, CKMBINDEX, TROPONINI in the last 168 hours. BNP (last 3 results) No results for input(s): PROBNP in the last 8760 hours. HbA1C:  Recent Labs  08/06/16 2010  HGBA1C 9.2*   CBG:  Recent Labs Lab 08/08/16 2114 08/09/16 0641 08/09/16 0740 08/09/16 0908 08/09/16 1128  GLUCAP 108* 76 124* 91 76   Lipid Profile: No results for input(s): CHOL, HDL, LDLCALC, TRIG, CHOLHDL, LDLDIRECT in the last 72 hours. Thyroid Function Tests: No results for input(s): TSH, T4TOTAL, FREET4, T3FREE, THYROIDAB in the last 72 hours. Anemia Panel: No results for input(s): VITAMINB12, FOLATE, FERRITIN, TIBC, IRON, RETICCTPCT in the last 72 hours. Urine analysis: No results found for: COLORURINE, APPEARANCEUR, LABSPEC, PHURINE, GLUCOSEU, HGBUR, BILIRUBINUR, KETONESUR, PROTEINUR, UROBILINOGEN, NITRITE, LEUKOCYTESUR Sepsis Labs: @LABRCNTIP (procalcitonin:4,lacticidven:4)  ) Recent Results (from the past 240 hour(s))  MRSA PCR Screening     Status: None   Collection Time: 08/07/16 12:45 AM   Result Value Ref Range Status   MRSA by PCR NEGATIVE NEGATIVE Final    Comment:        The GeneXpert MRSA Assay (FDA approved for NASAL specimens only), is one component of a comprehensive MRSA colonization surveillance program. It is not intended to diagnose MRSA infection nor to guide or monitor treatment for MRSA infections.          Radiology Studies: Mr Foot Right W Wo Contrast  Result Date: 08/08/2016 CLINICAL DATA:  Right foot pain and swelling. EXAM: MRI OF THE RIGHT FOREFOOT WITHOUT AND WITH CONTRAST TECHNIQUE: Multiplanar, multisequence MR imaging of the right forefoot was performed before and after the administration of intravenous contrast. CONTRAST:  8mL MULTIHANCE GADOBENATE DIMEGLUMINE 529 MG/ML IV SOLN COMPARISON:  None. FINDINGS:  Bones/Joint/Cartilage No marrow signal abnormality. No fracture or dislocation. Normal alignment. No joint effusion. Moderate osteoarthritis of the first MTP joint. Ligaments Collateral ligaments are intact. Muscles and Tendons Flexor, peroneal and extensor compartment tendons are intact. Muscles are normal in signal. No muscle atrophy. Soft tissue Generalized soft tissue edema and enhancement of the midfoot and forefoot. Focal area of nonenhancing tissue between the second and third metatarsal heads and toes measuring approximately 2.6 x 3.2 x 2.8 cm with interspersed areas rounded low signal foci likely reflecting air most concerning for necrotic tissue. No focal fluid collection. No soft tissue mass. IMPRESSION: 1. No evidence of osteomyelitis of the right forefoot. 2. Cellulitis of the midfoot and forefoot. 3. Focal area of nonenhancing tissue between the second and third metatarsal heads and toes measuring approximately 2.6 x 3.2 x 2.8 cm with interspersed areas rounded low signal foci likely reflecting air most concerning for necrotic tissue. Electronically Signed   By: Kathreen Devoid   On: 08/08/2016 13:37   Dg Chest Port 1 View  Result  Date: 08/08/2016 CLINICAL DATA:  Initial evaluation for acute cough. History of COPD, CHF, asthma, PE. EXAM: PORTABLE CHEST 1 VIEW COMPARISON:  Prior radiograph from 09/07/2014. FINDINGS: Mild cardiomegaly, stable from previous. Mediastinal silhouette within normal limits. Lungs hypoinflated. Underlying changes related COPD. Mild diffuse pulmonary vascular congestion without frank pulmonary edema. No pleural effusion. No focal infiltrates. No pneumothorax. No acute osseous abnormality. Degenerative changes noted about the shoulders bilaterally. IMPRESSION: 1. Mild cardiomegaly with perihilar vascular congestion without overt pulmonary edema. 2. Underlying COPD. 3. No other active cardiopulmonary disease. Electronically Signed   By: Jeannine Boga M.D.   On: 08/08/2016 21:28        Scheduled Meds: . DULoxetine  60 mg Oral BID  . enoxaparin (LOVENOX) injection  40 mg Subcutaneous Q24H  . insulin aspart  0-9 Units Subcutaneous TID WC  . insulin glargine  53 Units Subcutaneous QHS  . meclizine  12.5 mg Oral TID  . midodrine  5 mg Oral TID AC  . mometasone-formoterol  2 puff Inhalation BID  . pantoprazole  40 mg Oral Daily  . rosuvastatin  40 mg Oral Daily   Continuous Infusions: . sodium chloride 50 mL/hr at 08/09/16 1327  . clindamycin (CLEOCIN) IV Stopped (08/09/16 0430)  . lactated ringers 10 mL/hr at 08/09/16 1327  . piperacillin-tazobactam (ZOSYN)  IV 3.375 g (08/09/16 0616)     LOS: 2 days    Time spent: 25 minutes. Greater than 50% of this time was spent in direct contact with the patient coordinating care.     Lelon Frohlich, MD [facility] Pager [phone]  If 7PM-7AM, please contact night-coverage [email or web address] Password [email or web address] 08/09/2016, 2:07 PM

## 2016-08-09 NOTE — Anesthesia Procedure Notes (Signed)
Spinal  Patient location during procedure: OR Start time: 08/09/2016 8:00 AM Preanesthetic Checklist Completed: patient identified, site marked, surgical consent, pre-op evaluation, timeout performed, IV checked, risks and benefits discussed and monitors and equipment checked Spinal Block Patient position: right lateral decubitus Prep: Betadine Patient monitoring: heart rate, cardiac monitor, continuous pulse ox and blood pressure Approach: right paramedian Location: L3-4 Injection technique: single-shot Needle Needle type: Spinocan  Needle gauge: 22 G Needle length: 9 cm Additional Notes ATTEMPTS:2, unsuccessful TRAY [id number] TRAY EXPIRATION DATE:03/06/2017

## 2016-08-09 NOTE — H&P (View-Only) (Signed)
Subjective: Patient states her right foot feels better. Denies any pain at rest.  Objective: Vital signs in last 24 hours: Temp:  [98 F (36.7 C)-98.6 F (37 C)] 98 F (36.7 C) (06/04 0624) Pulse Rate:  [79-89] 79 (06/04 0624) Resp:  [18] 18 (06/04 0624) BP: (126-145)/(59-66) 138/66 (06/04 0624) SpO2:  [90 %-94 %] 94 % (06/04 0624) Last BM Date: 08/06/16  Intake/Output from previous day: 06/03 0701 - 06/04 0700 In: 2179.2 [P.O.:240; I.V.:1039.2; IV Piggyback:900] Out: -  Intake/Output this shift: No intake/output data recorded.  General appearance: alert, cooperative and no distress Extremities: Bulla has formed at the base of the right second and third toes. Plantar skin cellulitis also noted.  Lab Results:   Recent Labs  08/07/16 0656 08/08/16 0452  WBC 14.4* 15.0*  HGB 11.5* 11.7*  HCT 34.4* 34.9*  PLT 178 190   BMET  Recent Labs  08/07/16 0656 08/08/16 0452  NA 138 136  K 3.8 3.7  CL 104 103  CO2 25 24  GLUCOSE 251* 188*  BUN 8 9  CREATININE 0.55 0.58  CALCIUM 8.4* 8.3*   PT/INR No results for input(s): LABPROT, INR in the last 72 hours.  Studies/Results: Dg Foot 2 Views Right  Result Date: 08/06/2016 CLINICAL DATA:  Acute onset of right foot swelling and erythema. Right foot pain. Tack removed from plantar surface of foot 2 days ago. Initial encounter. EXAM: RIGHT FOOT - 2 VIEW COMPARISON:  None. FINDINGS: There is no evidence of fracture or dislocation. No definite osseous erosion is seen, though evaluation for osteomyelitis is limited on radiograph. The joint spaces are preserved. There is no evidence of talar subluxation; the subtalar joint is unremarkable in appearance. Diffuse soft tissue swelling is noted about the forefoot and midfoot. Prominent soft tissue air is seen tracking about the plantar aspect of the forefoot, between the second and third metatarsophalangeal joints. IMPRESSION: 1. No evidence fracture or dislocation. 2. No definite  osseous erosion seen, though evaluation for osteomyelitis is limited on radiograph,. If there is significant clinical concern for osteomyelitis, MRI of the right foot could be considered for further evaluation. 3. Prominent soft tissue air tracking about the plantar aspect of the forefoot, between the second and third metatarsophalangeal joints. This raises concern for infection with a gas producing organism. Underlying necrotizing fasciitis cannot be excluded. These results were called by telephone at the time of interpretation on 08/06/2016 at 10:48 pm to Dr. Noemi Chapel, who verbally acknowledged these results. Electronically Signed   By: Garald Balding M.D.   On: 08/06/2016 22:48    Anti-infectives: Anti-infectives    Start     Dose/Rate Route Frequency Ordered Stop   08/07/16 1000  vancomycin (VANCOCIN) IVPB 1000 mg/200 mL premix     1,000 mg 200 mL/hr over 60 Minutes Intravenous Every 12 hours 08/07/16 0956     08/07/16 1000  piperacillin-tazobactam (ZOSYN) IVPB 3.375 g     3.375 g 12.5 mL/hr over 240 Minutes Intravenous Every 8 hours 08/07/16 0956     08/07/16 0300  clindamycin (CLEOCIN) IVPB 600 mg     600 mg 100 mL/hr over 30 Minutes Intravenous Every 8 hours 08/07/16 0202     08/07/16 0000  piperacillin-tazobactam (ZOSYN) IVPB 3.375 g     3.375 g 12.5 mL/hr over 240 Minutes Intravenous  Once 08/06/16 2343 08/07/16 0625   08/06/16 2000  vancomycin (VANCOCIN) IVPB 1000 mg/200 mL premix     1,000 mg 200 mL/hr over 60 Minutes Intravenous  Once 08/06/16 1949 08/07/16 3154      Assessment/Plan: Impression: Cellulitis of right foot secondary to diabetes mellitus, trauma secondary to puncture by foreign body Plan: Patient to get MRI of right foot today. Further management pending those results. Patient is scheduled for incision and drainage of the right foot with possible second and third toe amputations tomorrow. The risks and benefits of the procedure were fully explained to the patient,  who gave informed consent.  LOS: 1 day    Aviva Signs 08/08/2016

## 2016-08-10 ENCOUNTER — Encounter (HOSPITAL_COMMUNITY): Payer: Self-pay | Admitting: General Surgery

## 2016-08-10 DIAGNOSIS — L03119 Cellulitis of unspecified part of limb: Secondary | ICD-10-CM

## 2016-08-10 DIAGNOSIS — I10 Essential (primary) hypertension: Secondary | ICD-10-CM

## 2016-08-10 DIAGNOSIS — L02619 Cutaneous abscess of unspecified foot: Secondary | ICD-10-CM

## 2016-08-10 LAB — GLUCOSE, CAPILLARY
GLUCOSE-CAPILLARY: 96 mg/dL (ref 65–99)
Glucose-Capillary: 73 mg/dL (ref 65–99)
Glucose-Capillary: 84 mg/dL (ref 65–99)
Glucose-Capillary: 176 mg/dL — ABNORMAL HIGH (ref 65–99)
Glucose-Capillary: 167 mg/dL — ABNORMAL HIGH (ref 65–99)

## 2016-08-10 LAB — CBC
HEMATOCRIT: 35.1 % — AB (ref 36.0–46.0)
HEMOGLOBIN: 11.6 g/dL — AB (ref 12.0–15.0)
MCH: 29.3 pg (ref 26.0–34.0)
MCHC: 33 g/dL (ref 30.0–36.0)
MCV: 88.6 fL (ref 78.0–100.0)
Platelets: 223 10*3/uL (ref 150–400)
RBC: 3.96 MIL/uL (ref 3.87–5.11)
RDW: 13 % (ref 11.5–15.5)
WBC: 11.6 10*3/uL — ABNORMAL HIGH (ref 4.0–10.5)

## 2016-08-10 LAB — BASIC METABOLIC PANEL
ANION GAP: 7 (ref 5–15)
BUN: 6 mg/dL (ref 6–20)
CALCIUM: 8.5 mg/dL — AB (ref 8.9–10.3)
CHLORIDE: 102 mmol/L (ref 101–111)
CO2: 29 mmol/L (ref 22–32)
Creatinine, Ser: 0.59 mg/dL (ref 0.44–1.00)
GFR calc Af Amer: >60 mL/min (ref >60)
GFR calc non Af Amer: >60 mL/min (ref >60)
GLUCOSE: 94 mg/dL (ref 65–99)
Potassium: 3.8 mmol/L (ref 3.5–5.1)
Sodium: 138 mmol/L (ref 135–145)

## 2016-08-10 MED ORDER — INSULIN GLARGINE 100 UNIT/ML ~~LOC~~ SOLN
20.0000 [IU] | Freq: Every day | SUBCUTANEOUS | Status: DC
  Administered 2016-08-10: 20 [IU] via SUBCUTANEOUS
  Filled 2016-08-10 (×2): qty 0.2

## 2016-08-10 MED ORDER — ALBUTEROL SULFATE (2.5 MG/3ML) 0.083% IN NEBU
2.5000 mg | INHALATION_SOLUTION | Freq: Four times a day (QID) | RESPIRATORY_TRACT | Status: DC
  Administered 2016-08-10 – 2016-08-11 (×5): 2.5 mg via RESPIRATORY_TRACT
  Filled 2016-08-10 (×5): qty 3

## 2016-08-10 MED ORDER — METHYLPREDNISOLONE SODIUM SUCC 125 MG IJ SOLR
125.0000 mg | Freq: Once | INTRAMUSCULAR | Status: AC
  Administered 2016-08-10: 125 mg via INTRAVENOUS
  Filled 2016-08-10: qty 2

## 2016-08-10 NOTE — Progress Notes (Signed)
Inpatient Diabetes Program Recommendations  AACE/ADA: New Consensus Statement on Inpatient Glycemic Control (2015)  Target Ranges:  Prepandial:   less than 140 mg/dL      Peak postprandial:   less than 180 mg/dL (1-2 hours)      Critically ill patients:  140 - 180 mg/dL   Results for ADALEEN, HULGAN (MRN [id number]) as of 08/10/2016 08:13  Ref. Range 08/09/2016 06:41 08/09/2016 07:40 08/09/2016 09:08 08/09/2016 11:28 08/09/2016 16:18 08/09/2016 21:48 08/10/2016 01:25 08/10/2016 07:58  Glucose-Capillary Latest Ref Range: 65 - 99 mg/dL 76 124 (H) 91 76 167 (H) 67 73 84   Review of Glycemic Control  Current orders for Inpatient glycemic control: Lantus 53 units QHS, Novolog 0-9 units TID with meals  Inpatient Diabetes Program Recommendations: Insulin - Basal: Last dose of Lantus 53 units was given on 08/08/16 at 22:14.  Fasting glucose 84 mg/dl this morning. In reviewing chart, noted Lantus was NOT GIVEN last night (charted as patient refused). May want to consider decreasing Lantus dose especially if patient experiences any further issues with hypoglycemia.   Thanks, Barnie Alderman, RN, MSN, CDE Diabetes Coordinator Inpatient Diabetes Program [phone] (Team Pager from 8am to 5pm)

## 2016-08-10 NOTE — Progress Notes (Signed)
1 Day Post-Op  Subjective: Patient doing well. Denies a significant right foot pain.  Objective: Vital signs in last 24 hours: Temp:  [97.9 F (36.6 C)-98.6 F (37 C)] 98 F (36.7 C) (06/06 0614) Pulse Rate:  [64-98] 69 (06/06 0614) Resp:  [18-25] 18 (06/06 0614) BP: (106-136)/(47-96) 108/62 (06/06 0614) SpO2:  [91 %-100 %] 97 % (06/06 0614) Last BM Date: 08/06/16  Intake/Output from previous day: 06/05 0701 - 06/06 0700 In: 2299.3 [P.O.:360; I.V.:1789.3; IV Piggyback:150] Out: 10 [Blood:10] Intake/Output this shift: No intake/output data recorded.  General appearance: alert, cooperative and no distress Extremities: Right foot packing removed. No purulent drainage present.  Lab Results:   Recent Labs  08/09/16 0446 08/10/16 0446  WBC 13.9* 11.6*  HGB 11.2* 11.6*  HCT 33.3* 35.1*  PLT 194 223   BMET  Recent Labs  08/08/16 0452 08/10/16 0446  NA 136 138  K 3.7 3.8  CL 103 102  CO2 24 29  GLUCOSE 188* 94  BUN 9 6  CREATININE 0.58 0.59  CALCIUM 8.3* 8.5*   PT/INR No results for input(s): LABPROT, INR in the last 72 hours.  Studies/Results: Mr Foot Right W Wo Contrast  Result Date: 08/08/2016 CLINICAL DATA:  Right foot pain and swelling. EXAM: MRI OF THE RIGHT FOREFOOT WITHOUT AND WITH CONTRAST TECHNIQUE: Multiplanar, multisequence MR imaging of the right forefoot was performed before and after the administration of intravenous contrast. CONTRAST:  44mL MULTIHANCE GADOBENATE DIMEGLUMINE 529 MG/ML IV SOLN COMPARISON:  None. FINDINGS: Bones/Joint/Cartilage No marrow signal abnormality. No fracture or dislocation. Normal alignment. No joint effusion. Moderate osteoarthritis of the first MTP joint. Ligaments Collateral ligaments are intact. Muscles and Tendons Flexor, peroneal and extensor compartment tendons are intact. Muscles are normal in signal. No muscle atrophy. Soft tissue Generalized soft tissue edema and enhancement of the midfoot and forefoot. Focal area of  nonenhancing tissue between the second and third metatarsal heads and toes measuring approximately 2.6 x 3.2 x 2.8 cm with interspersed areas rounded low signal foci likely reflecting air most concerning for necrotic tissue. No focal fluid collection. No soft tissue mass. IMPRESSION: 1. No evidence of osteomyelitis of the right forefoot. 2. Cellulitis of the midfoot and forefoot. 3. Focal area of nonenhancing tissue between the second and third metatarsal heads and toes measuring approximately 2.6 x 3.2 x 2.8 cm with interspersed areas rounded low signal foci likely reflecting air most concerning for necrotic tissue. Electronically Signed   By: Kathreen Devoid   On: 08/08/2016 13:37   Dg Chest Port 1 View  Result Date: 08/08/2016 CLINICAL DATA:  Initial evaluation for acute cough. History of COPD, CHF, asthma, PE. EXAM: PORTABLE CHEST 1 VIEW COMPARISON:  Prior radiograph from 09/07/2014. FINDINGS: Mild cardiomegaly, stable from previous. Mediastinal silhouette within normal limits. Lungs hypoinflated. Underlying changes related COPD. Mild diffuse pulmonary vascular congestion without frank pulmonary edema. No pleural effusion. No focal infiltrates. No pneumothorax. No acute osseous abnormality. Degenerative changes noted about the shoulders bilaterally. IMPRESSION: 1. Mild cardiomegaly with perihilar vascular congestion without overt pulmonary edema. 2. Underlying COPD. 3. No other active cardiopulmonary disease. Electronically Signed   By: Jeannine Boga M.D.   On: 08/08/2016 21:28    Anti-infectives: Anti-infectives    Start     Dose/Rate Route Frequency Ordered Stop   08/07/16 1000  vancomycin (VANCOCIN) IVPB 1000 mg/200 mL premix  Status:  Discontinued     1,000 mg 200 mL/hr over 60 Minutes Intravenous Every 12 hours 08/07/16 0956 08/09/16 1115  08/07/16 1000  piperacillin-tazobactam (ZOSYN) IVPB 3.375 g     3.375 g 12.5 mL/hr over 240 Minutes Intravenous Every 8 hours 08/07/16 0956      08/07/16 0300  clindamycin (CLEOCIN) IVPB 600 mg  Status:  Discontinued     600 mg 100 mL/hr over 30 Minutes Intravenous Every 8 hours 08/07/16 0202 08/09/16 1409   08/07/16 0000  piperacillin-tazobactam (ZOSYN) IVPB 3.375 g     3.375 g 12.5 mL/hr over 240 Minutes Intravenous  Once 08/06/16 2343 08/07/16 0625   08/06/16 2000  vancomycin (VANCOCIN) IVPB 1000 mg/200 mL premix     1,000 mg 200 mL/hr over 60 Minutes Intravenous  Once 08/06/16 1949 08/07/16 0626      Assessment/Plan: s/p Procedure(s): INCISION AND DRAINAGE ABSCESS Impression: Stable postoperatively. We'll start wound care. Will get home health involvement.  Anticipate discharge in next 24-48 hours.  LOS: 3 days    Aviva Signs 08/10/2016

## 2016-08-10 NOTE — Addendum Note (Signed)
Addendum  created 08/10/16 1636 by Vista Deck, CRNA   Sign clinical note

## 2016-08-10 NOTE — Progress Notes (Signed)
PROGRESS NOTE    Jacqueline Hansen  AST:[id number] DOB: 09-08-1940 DOA: 08/06/2016 PCP: Keane Police, MD    Brief Narrative: 76 yo with severe COPD and asthma, on home oxygen, DM,  lives at home with her daughter, admitted for cellulitis and abscess after stepping on a thumb tag.  She had I and D by Dr Arnoldo Morale, and is on antibiotics.  She complained of wheezing and having DOE.    Assessment & Plan:   Active Problems:   DM (diabetes mellitus) ([place])   PAD (peripheral artery disease) ([facility])   Cellulitis   Cellulitis and abscess of foot   1. Cellulitis and abscess on the foot:  S/P I and D by Dr Arnoldo Morale.   Plan d/c home on antibiotic one to two days. 2. COPD:  She is wheezing.  Will give her one dose of IV steroids, and start neb RTC.   3. DM:  She was low on her CBG.  She refused her Lantus.  Will restart low dose (20 units) a hs.  Her usual dose has been 50 units.   DVT prophylaxis: Lovenox.   Code Status: FULL CODE.  Family Communication: patient only.  Disposition Plan: home   Consultants:  Surgery.   Procedures:   I and D.   Antimicrobials: Anti-infectives    Start     Dose/Rate Route Frequency Ordered Stop   08/07/16 1000  vancomycin (VANCOCIN) IVPB 1000 mg/200 mL premix  Status:  Discontinued     1,000 mg 200 mL/hr over 60 Minutes Intravenous Every 12 hours 08/07/16 0956 08/09/16 1115   08/07/16 1000  piperacillin-tazobactam (ZOSYN) IVPB 3.375 g     3.375 g 12.5 mL/hr over 240 Minutes Intravenous Every 8 hours 08/07/16 0956     08/07/16 0300  clindamycin (CLEOCIN) IVPB 600 mg  Status:  Discontinued     600 mg 100 mL/hr over 30 Minutes Intravenous Every 8 hours 08/07/16 0202 08/09/16 1409   08/07/16 0000  piperacillin-tazobactam (ZOSYN) IVPB 3.375 g     3.375 g 12.5 mL/hr over 240 Minutes Intravenous  Once 08/06/16 2343 08/07/16 0625   08/06/16 2000  vancomycin (VANCOCIN) IVPB 1000 mg/200 mL premix     1,000 mg 200 mL/hr over 60 Minutes Intravenous  Once 08/06/16 1949  08/07/16 0626       Subjective: My asthma is bad.   Objective: Vitals:   08/09/16 1434 08/09/16 2048 08/09/16 2340 08/10/16 0614  BP:   (!) 110/59 108/62  Pulse:   78 69  Resp:   18 18  Temp:   98.4 F (36.9 C) 98 F (36.7 C)  TempSrc:   Oral Oral  SpO2: 99% 97% 98% 97%  Weight:      Height:        Intake/Output Summary (Last 24 hours) at 08/10/16 1408 Last data filed at 08/10/16 0451  Gross per 24 hour  Intake          1499.33 ml  Output                0 ml  Net          1499.33 ml   Filed Weights   08/06/16 1919 08/07/16 0123 08/09/16 0646  Weight: 94.3 kg (208 lb) 94.7 kg (208 lb 10.9 oz) 94.3 kg (208 lb)    Examination:  General exam: Appears calm and comfortable  Respiratory system: bilateral wheezing with no rales.  Cardiovascular system: S1 & S2 heard, RRR. No JVD, murmurs, rubs, gallops or clicks.  No pedal edema. Gastrointestinal system: Abdomen is nondistended, soft and nontender. No organomegaly or masses felt. Normal bowel sounds heard. Central nervous system: Alert and oriented. No focal neurological deficits. Extremities: Symmetric 5 x 5 power. Dressing not removed on her right foot.  Skin: No rashes, lesions or ulcers Psychiatry: Judgement and insight appear normal. Mood & affect appropriate.   Data Reviewed: I have personally reviewed following labs and imaging studies  CBC:  Recent Labs Lab 08/06/16 2010 08/07/16 0656 08/08/16 0452 08/09/16 0446 08/10/16 0446  WBC 14.5* 14.4* 15.0* 13.9* 11.6*  NEUTROABS 11.4*  --   --   --   --   HGB 12.3 11.5* 11.7* 11.2* 11.6*  HCT 36.1 34.4* 34.9* 33.3* 35.1*  MCV 86.6 86.6 86.8 86.9 88.6  PLT 158 178 190 194 681   Basic Metabolic Panel:  Recent Labs Lab 08/06/16 2010 08/07/16 0656 08/08/16 0452 08/10/16 0446  NA 138 138 136 138  K 3.2* 3.8 3.7 3.8  CL 102 104 103 102  CO2 27 25 24 29   GLUCOSE 299* 251* 188* 94  BUN 13 8 9 6   CREATININE 0.78 0.55 0.58 0.59  CALCIUM 8.9 8.4* 8.3* 8.5*    GFR: Estimated Creatinine Clearance: 67.4 mL/min (by C-G formula based on SCr of 0.59 mg/dL). Liver Function Tests:  Recent Labs Lab 08/07/16 0656  AST 9*  ALT 10*  ALKPHOS 55  BILITOT 0.7  PROT 6.1*  ALBUMIN 2.5*   CBG:  Recent Labs Lab 08/09/16 1618 08/09/16 2148 08/10/16 0125 08/10/16 0758 08/10/16 1228  GLUCAP 167* 67 73 84 96   Recent Results (from the past 240 hour(s))  MRSA PCR Screening     Status: None   Collection Time: 08/07/16 12:45 AM  Result Value Ref Range Status   MRSA by PCR NEGATIVE NEGATIVE Final    Comment:        The GeneXpert MRSA Assay (FDA approved for NASAL specimens only), is one component of a comprehensive MRSA colonization surveillance program. It is not intended to diagnose MRSA infection nor to guide or monitor treatment for MRSA infections.   Aerobic/Anaerobic Culture (surgical/deep wound)     Status: None (Preliminary result)   Collection Time: 08/09/16  8:37 AM  Result Value Ref Range Status   Specimen Description ABSCESS  Final   Special Requests NONE  Final   Gram Stain   Final    MODERATE WBC PRESENT,BOTH PMN AND MONONUCLEAR MODERATE GRAM POSITIVE COCCI IN PAIRS MODERATE GRAM VARIABLE ROD    Culture   Final    CULTURE REINCUBATED FOR BETTER GROWTH Performed at [facility], [place] [address], [place], [place] [address]    Report Status PENDING  Incomplete     Radiology Studies: Dg Chest Port 1 View  Result Date: 08/08/2016 CLINICAL DATA:  Initial evaluation for acute cough. History of COPD, CHF, asthma, PE. EXAM: PORTABLE CHEST 1 VIEW COMPARISON:  Prior radiograph from 09/07/2014. FINDINGS: Mild cardiomegaly, stable from previous. Mediastinal silhouette within normal limits. Lungs hypoinflated. Underlying changes related COPD. Mild diffuse pulmonary vascular congestion without frank pulmonary edema. No pleural effusion. No focal infiltrates. No pneumothorax. No acute osseous abnormality. Degenerative changes noted  about the shoulders bilaterally. IMPRESSION: 1. Mild cardiomegaly with perihilar vascular congestion without overt pulmonary edema. 2. Underlying COPD. 3. No other active cardiopulmonary disease. Electronically Signed   By: Jeannine Boga M.D.   On: 08/08/2016 21:28    Scheduled Meds: . albuterol  2.5 mg Nebulization QID  .  DULoxetine  60 mg Oral BID  . enoxaparin (LOVENOX) injection  40 mg Subcutaneous Q24H  . insulin aspart  0-9 Units Subcutaneous TID WC  . insulin glargine  20 Units Subcutaneous QHS  . insulin glargine  53 Units Subcutaneous QHS  . meclizine  12.5 mg Oral TID  . methylPREDNISolone (SOLU-MEDROL) injection  125 mg Intravenous Once  . midodrine  5 mg Oral TID AC  . mometasone-formoterol  2 puff Inhalation BID  . pantoprazole  40 mg Oral Daily  . rosuvastatin  40 mg Oral Daily   Continuous Infusions: . sodium chloride 50 mL/hr at 08/10/16 1350  . lactated ringers 10 mL/hr at 08/09/16 1327  . piperacillin-tazobactam (ZOSYN)  IV 3.375 g (08/10/16 1350)     LOS: 3 days   Clifton Kovacic, MD FACP Hospitalist.   If 7PM-7AM, please contact night-coverage [email or web address] Password [email or web address] 08/10/2016, 2:08 PM

## 2016-08-10 NOTE — Anesthesia Postprocedure Evaluation (Signed)
Anesthesia Post Note  Patient: Jacqueline Hansen  Procedure(s) Performed: Procedure(s) (LRB): INCISION AND DRAINAGE ABSCESS (Right)  Patient location during evaluation: Nursing Unit Anesthesia Type: General Level of consciousness: awake and alert Pain management: satisfactory to patient Vital Signs Assessment: post-procedure vital signs reviewed and stable Respiratory status: spontaneous breathing and patient connected to nasal cannula oxygen Cardiovascular status: stable Anesthetic complications: no     Last Vitals:  Vitals:   08/10/16 0614 08/10/16 1439  BP: 108/62 (!) 161/81  Pulse: 69 86  Resp: 18 (!) 21  Temp: 36.7 C 36.8 C    Last Pain:  Vitals:   08/10/16 1439  TempSrc: Axillary  PainSc:                  Drucie Opitz

## 2016-08-11 DIAGNOSIS — E118 Type 2 diabetes mellitus with unspecified complications: Secondary | ICD-10-CM

## 2016-08-11 DIAGNOSIS — Z794 Long term (current) use of insulin: Secondary | ICD-10-CM

## 2016-08-11 DIAGNOSIS — L03115 Cellulitis of right lower limb: Secondary | ICD-10-CM

## 2016-08-11 LAB — CBC
HCT: 35.3 % — ABNORMAL LOW (ref 36.0–46.0)
Hemoglobin: 11.5 g/dL — ABNORMAL LOW (ref 12.0–15.0)
MCH: 29 pg (ref 26.0–34.0)
MCHC: 32.6 g/dL (ref 30.0–36.0)
MCV: 89.1 fL (ref 78.0–100.0)
PLATELETS: 225 10*3/uL (ref 150–400)
RBC: 3.96 MIL/uL (ref 3.87–5.11)
RDW: 12.4 % (ref 11.5–15.5)
WBC: 8.2 10*3/uL (ref 4.0–10.5)

## 2016-08-11 LAB — BASIC METABOLIC PANEL
ANION GAP: 8 (ref 5–15)
BUN: 8 mg/dL (ref 6–20)
CALCIUM: 8.6 mg/dL — AB (ref 8.9–10.3)
CO2: 29 mmol/L (ref 22–32)
Chloride: 101 mmol/L (ref 101–111)
Creatinine, Ser: 0.6 mg/dL (ref 0.44–1.00)
GFR calc non Af Amer: >60 mL/min (ref >60)
Glucose, Bld: 210 mg/dL — ABNORMAL HIGH (ref 65–99)
POTASSIUM: 4.1 mmol/L (ref 3.5–5.1)
SODIUM: 138 mmol/L (ref 135–145)

## 2016-08-11 LAB — GLUCOSE, CAPILLARY
GLUCOSE-CAPILLARY: 184 mg/dL — AB (ref 65–99)
GLUCOSE-CAPILLARY: 160 mg/dL — AB (ref 65–99)

## 2016-08-11 MED ORDER — INSULIN GLARGINE 100 UNIT/ML ~~LOC~~ SOLN: 20.0000 [IU] | Freq: Every day | SUBCUTANEOUS | 11 refills | Status: AC

## 2016-08-11 MED ORDER — CIPROFLOXACIN HCL 250 MG PO TABS
500.0000 mg | ORAL_TABLET | Freq: Two times a day (BID) | ORAL | Status: DC
  Administered 2016-08-11: 500 mg via ORAL
  Filled 2016-08-11: qty 2

## 2016-08-11 MED ORDER — CIPROFLOXACIN HCL 500 MG PO TABS: 500.0000 mg | ORAL_TABLET | Freq: Two times a day (BID) | ORAL | 0 refills | Status: DC

## 2016-08-11 NOTE — Discharge Summary (Signed)
Physician Discharge Summary  Jacqueline Hansen HQP:[id number] DOB: May 30, 1940 DOA: 08/06/2016  PCP: Keane Police, MD  Admit date: 08/06/2016 Discharge date: 08/11/2016  Admitted From: Home.  Disposition:  To home.   Recommendations for Outpatient Follow-up:  1. Follow up with PCP in 1-2 weeks 2. Follow up with dr Arnoldo Morale in 1-2 weeks.   Home Health: none. Equipment/Devices: None.  Discharge Condition: improved.  Minimal wheezing.  Wound on foot in healing.  CODE STATUS: FULL CODE.  Diet recommendation:  Carb modified diet.   Brief/Interim Summary: patient was admitted for cellulitis of the right foot after stepping on a thumb tac by Dr Darrick Meigs on August 06, 2016.  As per his H and P:  "  Jacqueline Hansen  is a 76 y.o. female, With history of diabetes mellitus, peripheral arterial disease, CHF, COPD came to hospital with redness and swelling of right foot. Patient says that she stepped on tac last week, which was pulled by grandson after that the foot started getting red. She was seen by PCP who told her that she has toenail fungus. And today when she saw NP at PCP office, she was told that she has gout. Patient came to ED for further evaluation.she complains of severe pain 10/10 intensity, involving the right foot and leg. Patient has poorly controlled diabetes mellitus with peripheral arterial disease. She denies fever but admits some chills. Denies nausea vomiting or diarrhea. No constipation.   denies abdominal pain or dysuria.   She complains of regurgitation of food, which is a chronic problem .Patient says that she had esophageal dilation 5 times in past several years at [facility]. She is not sure of diagnosis.  HOSPITAL COURSE:  Patient was admitted into the hospital, and MRI of her foot showed no evidence of osteomyelitis.  She was started on Van/Zosyn, and her IV Clinda was not continued.  She was seen in consultation with surgery, and Dr Arnoldo Morale took her into the OR, and performed  I and D.  She subsequently did well, and was continued on Zosyn.  She did have some wheezing, and was given Neb, along with one dose of IV Solumedrol.  She did well and her wheezing was minimal, felt to be at baseline.  Dr Arnoldo Morale recommended discharge her on Cipro, and will follow up with him in 1-2 weeks.  During her hospital stay, her Lantus was reduced to 20 units, with home dose of 50 units Q hs.  She will be discharged on 30 units per day.  She will follow up with her PCP next week and with Dr Arnoldo Morale as set up by him.  She will complete a 10 days course of Cipro.  Thank you for allowing me to participate in her care.  Good Day.   Discharge Diagnoses:  Active Problems:   DM (diabetes mellitus) ([place])   PAD (peripheral artery disease) ([facility])   Cellulitis   Cellulitis and abscess of foot  Discharge Instructions  Discharge Instructions    Diet - low sodium heart healthy    Complete by:  As directed    Discharge instructions    Complete by:  As directed    Take Cipro twice daily for 10 days.  See Dr Arnoldo Morale in 2 weeks.  Follow up with your PCP next week.   Face-to-face encounter (required for Medicare/Medicaid patients)    Complete by:  As directed    I Ayomide Purdy certify that this patient is under my care and that I, or a nurse  practitioner or physician's assistant working with me, had a face-to-face encounter that meets the physician face-to-face encounter requirements with this patient on 08/11/2016. The encounter with the patient was in whole, or in part for the following medical condition(s) which is the primary reason for home health care (List medical condition): weakness   The encounter with the patient was in whole, or in part, for the following medical condition, which is the primary reason for home health care:  weakness of lower extremities.   I certify that, based on my findings, the following services are medically necessary home health services:  Physical therapy   Reason for  Medically Necessary Home Health Services:  Other See Comments   My clinical findings support the need for the above services:  OTHER SEE COMMENTS   Further, I certify that my clinical findings support that this patient is homebound due to:  Unable to leave home safely without assistance   Home Health    Complete by:  As directed    To provide the following care/treatments:   PT OT     Increase activity slowly    Complete by:  As directed      Allergies as of 08/11/2016      Reactions   Adhesive [tape] Other (See Comments)   Reaction:  Tears pts skin       Medication List    STOP taking these medications   insulin glargine 100 unit/mL Sopn Commonly known as:  LANTUS Replaced by:  insulin glargine 100 UNIT/ML injection   meclizine 12.5 MG tablet Commonly known as:  ANTIVERT     TAKE these medications   albuterol 108 (90 Base) MCG/ACT inhaler Commonly known as:  PROVENTIL HFA;VENTOLIN HFA Inhale 1-2 puffs into the lungs every 6 (six) hours as needed for wheezing or shortness of breath.   ALPRAZolam 0.5 MG tablet Commonly known as:  XANAX Take 0.5 mg by mouth 3 (three) times daily as needed for anxiety.   aspirin EC 81 MG tablet Take 81 mg by mouth daily.   ciprofloxacin 500 MG tablet Commonly known as:  CIPRO Take 1 tablet (500 mg total) by mouth 2 (two) times daily.   clopidogrel 75 MG tablet Commonly known as:  PLAVIX Take 75 mg by mouth daily.   DULoxetine 60 MG capsule Commonly known as:  CYMBALTA Take 60 mg by mouth 2 (two) times daily.   esomeprazole 40 MG capsule Commonly known as:  NEXIUM Take 40 mg by mouth daily at 12 noon.   fluticasone 50 MCG/ACT nasal spray Commonly known as:  FLONASE Place 2 sprays into both nostrils daily.   Fluticasone-Salmeterol 250-50 MCG/DOSE Aepb Commonly known as:  ADVAIR Inhale 1 puff into the lungs 2 (two) times daily.   insulin glargine 100 UNIT/ML injection Commonly known as:  LANTUS Inject 0.2 mLs (20 Units  total) into the skin at bedtime. Replaces:  insulin glargine 100 unit/mL Sopn   metFORMIN 500 MG tablet Commonly known as:  GLUCOPHAGE Take 500 mg by mouth 2 (two) times daily with a meal.   midodrine 5 MG tablet Commonly known as:  PROAMATINE Take 5 mg by mouth 3 (three) times daily.   rosuvastatin 40 MG tablet Commonly known as:  CRESTOR Take 40 mg by mouth daily.      Follow-up Information    Aviva Signs, MD. Schedule an appointment as soon as possible for a visit on 08/25/2016.   Specialty:  General Surgery Contact information: [address]  [place], [facility].   Contact information: [place] [address] [phone]          Allergies  Allergen Reactions  . Adhesive [Tape] Other (See Comments)    Reaction:  Tears pts skin     Consultations:  Surgery Dr Arnoldo Morale.    Procedures/Studies: Mr Foot Right W Wo Contrast  Result Date: 08/08/2016 CLINICAL DATA:  Right foot pain and swelling. EXAM: MRI OF THE RIGHT FOREFOOT WITHOUT AND WITH CONTRAST TECHNIQUE: Multiplanar, multisequence MR imaging of the right forefoot was performed before and after the administration of intravenous contrast. CONTRAST:  54mL MULTIHANCE GADOBENATE DIMEGLUMINE 529 MG/ML IV SOLN COMPARISON:  None. FINDINGS: Bones/Joint/Cartilage No marrow signal abnormality. No fracture or dislocation. Normal alignment. No joint effusion. Moderate osteoarthritis of the first MTP joint. Ligaments Collateral ligaments are intact. Muscles and Tendons Flexor, peroneal and extensor compartment tendons are intact. Muscles are normal in signal. No muscle atrophy. Soft tissue Generalized soft tissue edema and enhancement of the midfoot and forefoot. Focal area of nonenhancing tissue between the second and third metatarsal heads and toes measuring approximately 2.6 x 3.2 x 2.8 cm with interspersed areas rounded low signal  foci likely reflecting air most concerning for necrotic tissue. No focal fluid collection. No soft tissue mass. IMPRESSION: 1. No evidence of osteomyelitis of the right forefoot. 2. Cellulitis of the midfoot and forefoot. 3. Focal area of nonenhancing tissue between the second and third metatarsal heads and toes measuring approximately 2.6 x 3.2 x 2.8 cm with interspersed areas rounded low signal foci likely reflecting air most concerning for necrotic tissue. Electronically Signed   By: Kathreen Devoid   On: 08/08/2016 13:37   Dg Chest Port 1 View  Result Date: 08/08/2016 CLINICAL DATA:  Initial evaluation for acute cough. History of COPD, CHF, asthma, PE. EXAM: PORTABLE CHEST 1 VIEW COMPARISON:  Prior radiograph from 09/07/2014. FINDINGS: Mild cardiomegaly, stable from previous. Mediastinal silhouette within normal limits. Lungs hypoinflated. Underlying changes related COPD. Mild diffuse pulmonary vascular congestion without frank pulmonary edema. No pleural effusion. No focal infiltrates. No pneumothorax. No acute osseous abnormality. Degenerative changes noted about the shoulders bilaterally. IMPRESSION: 1. Mild cardiomegaly with perihilar vascular congestion without overt pulmonary edema. 2. Underlying COPD. 3. No other active cardiopulmonary disease. Electronically Signed   By: Jeannine Boga M.D.   On: 08/08/2016 21:28   Dg Foot 2 Views Right  Result Date: 08/06/2016 CLINICAL DATA:  Acute onset of right foot swelling and erythema. Right foot pain. Tack removed from plantar surface of foot 2 days ago. Initial encounter. EXAM: RIGHT FOOT - 2 VIEW COMPARISON:  None. FINDINGS: There is no evidence of fracture or dislocation. No definite osseous erosion is seen, though evaluation for osteomyelitis is limited on radiograph. The joint spaces are preserved. There is no evidence of talar subluxation; the subtalar joint is unremarkable in appearance. Diffuse soft tissue swelling is noted about the forefoot and  midfoot. Prominent soft tissue air is seen tracking about the plantar aspect of the forefoot, between the second and third metatarsophalangeal joints. IMPRESSION: 1. No evidence fracture or dislocation. 2. No definite osseous erosion seen, though evaluation for osteomyelitis is limited on radiograph,. If there is significant clinical concern for osteomyelitis, MRI of the right foot could be considered for further evaluation. 3. Prominent soft tissue air tracking about the plantar aspect of the forefoot, between the second and third metatarsophalangeal joints. This raises  concern for infection with a gas producing organism. Underlying necrotizing fasciitis cannot be excluded. These results were called by telephone at the time of interpretation on 08/06/2016 at 10:48 pm to Dr. Noemi Chapel, who verbally acknowledged these results. Electronically Signed   By: Garald Balding M.D.   On: 08/06/2016 22:48       Subjective:  Breathing better.    Discharge Exam: Vitals:   08/11/16 0444 08/11/16 1123  BP: (!) 165/74 (!) 142/58  Pulse: 77 87  Resp: 20 20  Temp: 98 F (36.7 C) 97.8 F (36.6 C)   Vitals:   08/11/16 0444 08/11/16 0756 08/11/16 1123 08/11/16 1134  BP: (!) 165/74  (!) 142/58   Pulse: 77  87   Resp: 20  20   Temp: 98 F (36.7 C)  97.8 F (36.6 C)   TempSrc: Oral  Oral   SpO2: 97% 96% 94% 97%  Weight:      Height:        General: Pt is alert, awake, not in acute distress Cardiovascular: RRR, S1/S2 +, no rubs, no gallops Respiratory: CTA bilaterally, no wheezing, no rhonchi Abdominal: Soft, NT, ND, bowel sounds + Extremities: no edema, no cyanosis    The results of significant diagnostics from this hospitalization (including imaging, microbiology, ancillary and laboratory) are listed below for reference.     Microbiology: Recent Results (from the past 240 hour(s))  MRSA PCR Screening     Status: None   Collection Time: 08/07/16 12:45 AM  Result Value Ref Range Status    MRSA by PCR NEGATIVE NEGATIVE Final    Comment:        The GeneXpert MRSA Assay (FDA approved for NASAL specimens only), is one component of a comprehensive MRSA colonization surveillance program. It is not intended to diagnose MRSA infection nor to guide or monitor treatment for MRSA infections.   Aerobic/Anaerobic Culture (surgical/deep wound)     Status: None (Preliminary result)   Collection Time: 08/09/16  8:37 AM  Result Value Ref Range Status   Specimen Description ABSCESS  Final   Special Requests NONE  Final   Gram Stain   Final    MODERATE WBC PRESENT,BOTH PMN AND MONONUCLEAR MODERATE GRAM POSITIVE COCCI IN PAIRS MODERATE GRAM VARIABLE ROD    Culture   Final    CULTURE REINCUBATED FOR BETTER GROWTH Performed at [facility], [place] [address] , [place] [address]    Report Status PENDING  Incomplete     Basic Metabolic Panel:  Recent Labs Lab 08/06/16 2010 08/07/16 0656 08/08/16 0452 08/10/16 0446 08/11/16 0519  NA 138 138 136 138 138  K 3.2* 3.8 3.7 3.8 4.1  CL 102 104 103 102 101  CO2 27 25 24 29 29   GLUCOSE 299* 251* 188* 94 210*  BUN 13 8 9 6 8   CREATININE 0.78 0.55 0.58 0.59 0.60  CALCIUM 8.9 8.4* 8.3* 8.5* 8.6*   Liver Function Tests:  Recent Labs Lab 08/07/16 0656  AST 9*  ALT 10*  ALKPHOS 55  BILITOT 0.7  PROT 6.1*  ALBUMIN 2.5*   CBC:  Recent Labs Lab 08/06/16 2010 08/07/16 0656 08/08/16 0452 08/09/16 0446 08/10/16 0446 08/11/16 0519  WBC 14.5* 14.4* 15.0* 13.9* 11.6* 8.2  NEUTROABS 11.4*  --   --   --   --   --   HGB 12.3 11.5* 11.7* 11.2* 11.6* 11.5*  HCT 36.1 34.4* 34.9* 33.3* 35.1* 35.3*  MCV 86.6 86.6 86.8 86.9 88.6 89.1  PLT 158 178 190 194 223 225   CBG:  Recent Labs Lab 08/10/16 1228 08/10/16 1738 08/10/16 2055 08/11/16 0743 08/11/16 1138  GLUCAP 96 176* 167* 184* 160*   Microbiology Recent Results (from the past 240 hour(s))  MRSA PCR Screening     Status: None   Collection Time: 08/07/16  12:45 AM  Result Value Ref Range Status   MRSA by PCR NEGATIVE NEGATIVE Final    Comment:        The GeneXpert MRSA Assay (FDA approved for NASAL specimens only), is one component of a comprehensive MRSA colonization surveillance program. It is not intended to diagnose MRSA infection nor to guide or monitor treatment for MRSA infections.   Aerobic/Anaerobic Culture (surgical/deep wound)     Status: None (Preliminary result)   Collection Time: 08/09/16  8:37 AM  Result Value Ref Range Status   Specimen Description ABSCESS  Final   Special Requests NONE  Final   Gram Stain   Final    MODERATE WBC PRESENT,BOTH PMN AND MONONUCLEAR MODERATE GRAM POSITIVE COCCI IN PAIRS MODERATE GRAM VARIABLE ROD    Culture   Final    CULTURE REINCUBATED FOR BETTER GROWTH Performed at [facility], [place] [address], [place], [place] [address]    Report Status PENDING  Incomplete    Time coordinating discharge: Over 30 minutes SIGNED:  Orvan Falconer, MD FACP [facility] 08/11/2016, 1:14 PM   If 7PM-7AM, please contact night-coverage [email or web address] Password [email or web address]

## 2016-08-11 NOTE — Progress Notes (Signed)
All discharge instructions reviewed in detail with pt as well as pt's son & daughter with understanding verbalized by all.  Dressing to right foot changed; tolerated well.  No questions or concerns expressed by pt or family prior to discharge.  VSS.  Pt discharged to home with family in stable condition.

## 2016-08-11 NOTE — Care Management Note (Signed)
Case Management Note  Patient Details  Name: Jacqueline Hansen MRN: [id number] Date of Birth: [birth year]  Expected Discharge Date:  08/11/16               Expected Discharge Plan:  [place]  In-House Referral:  NA  Discharge planning Services  CM Consult  Post Acute Care Choice:  Home Health Choice offered to:  Patient  HH Arranged:  RN [facility] Agency:  [place]  Status of Service:  Completed, signed off  If discussed at [place] of Stay Meetings, dates discussed:  08/11/2016  Additional Comments: Pt discharging home today with [facility] nursing for wound care. Pt is saturating well on room air and his will DC home with HS oxygen as pta. Pt aware that [facility] has 48hrs to make their first visit. [facility] rep is aware of DC today and will obtain order from pt chart. No other needs communicated by pt.   Sherald Barge, RN 08/11/2016, 1:26 PM

## 2016-08-11 NOTE — Progress Notes (Signed)
2 Days Post-Op  Subjective: Patient denies any pain.  Objective: Vital signs in last 24 hours: Temp:  [98 F (36.7 C)-98.3 F (36.8 C)] 98 F (36.7 C) (06/07 0444) Pulse Rate:  [77-86] 77 (06/07 0444) Resp:  [20-21] 20 (06/07 0444) BP: (155-165)/(54-81) 165/74 (06/07 0444) SpO2:  [96 %-100 %] 96 % (06/07 0756) Last BM Date: 08/10/16  Intake/Output from previous day: 06/06 0701 - 06/07 0700 In: 933.3 [P.O.:120; I.V.:713.3; IV Piggyback:100] Out: 200 [Urine:200] Intake/Output this shift: No intake/output data recorded.  General appearance: alert, cooperative and no distress Extremities: Right foot wound has been dressed recently. Toes are not cyanotic.  Lab Results:   Recent Labs  08/10/16 0446 08/11/16 0519  WBC 11.6* 8.2  HGB 11.6* 11.5*  HCT 35.1* 35.3*  PLT 223 225   BMET  Recent Labs  08/10/16 0446 08/11/16 0519  NA 138 138  K 3.8 4.1  CL 102 101  CO2 29 29  GLUCOSE 94 210*  BUN 6 8  CREATININE 0.59 0.60  CALCIUM 8.5* 8.6*   PT/INR No results for input(s): LABPROT, INR in the last 72 hours.  Studies/Results: No results found.  Anti-infectives: Anti-infectives    Start     Dose/Rate Route Frequency Ordered Stop   08/07/16 1000  vancomycin (VANCOCIN) IVPB 1000 mg/200 mL premix  Status:  Discontinued     1,000 mg 200 mL/hr over 60 Minutes Intravenous Every 12 hours 08/07/16 0956 08/09/16 1115   08/07/16 1000  piperacillin-tazobactam (ZOSYN) IVPB 3.375 g     3.375 g 12.5 mL/hr over 240 Minutes Intravenous Every 8 hours 08/07/16 0956     08/07/16 0300  clindamycin (CLEOCIN) IVPB 600 mg  Status:  Discontinued     600 mg 100 mL/hr over 30 Minutes Intravenous Every 8 hours 08/07/16 0202 08/09/16 1409   08/07/16 0000  piperacillin-tazobactam (ZOSYN) IVPB 3.375 g     3.375 g 12.5 mL/hr over 240 Minutes Intravenous  Once 08/06/16 2343 08/07/16 0625   08/06/16 2000  vancomycin (VANCOCIN) IVPB 1000 mg/200 mL premix     1,000 mg 200 mL/hr over 60  Minutes Intravenous  Once 08/06/16 1949 08/07/16 0626      Assessment/Plan: s/p Procedure(s): INCISION AND DRAINAGE ABSCESS Impression: Stable postoperatively. Home health has been consulted for wound care. Okay for discharge from surgery standpoint. I will see the patient in 2 weeks for follow-up. Would send home on oral antibiotic pending culture results.  LOS: 4 days    Aviva Signs 08/11/2016

## 2016-08-11 NOTE — Care Management Important Message (Signed)
Important Message  Patient Details  Name: Jacqueline Hansen MRN: [id number] Date of Birth: Nov 15, 1940   Medicare Important Message Given:  Yes    Sherald Barge, RN 08/11/2016, 1:28 PM

## 2016-08-12 ENCOUNTER — Emergency Department (HOSPITAL_COMMUNITY): Payer: Medicare HMO

## 2016-08-12 ENCOUNTER — Inpatient Hospital Stay (HOSPITAL_COMMUNITY)
Admission: EM | Admit: 2016-08-12 | Discharge: 2016-08-14 | DRG: 190 | Disposition: A | Discharge status: Home / Self Care | Payer: Medicare HMO | Attending: Internal Medicine | Admitting: Internal Medicine

## 2016-08-12 ENCOUNTER — Encounter (HOSPITAL_COMMUNITY): Payer: Self-pay

## 2016-08-12 DIAGNOSIS — Z86711 Personal history of pulmonary embolism: Secondary | ICD-10-CM | POA: Diagnosis not present

## 2016-08-12 DIAGNOSIS — E118 Type 2 diabetes mellitus with unspecified complications: Secondary | ICD-10-CM

## 2016-08-12 DIAGNOSIS — E7800 Pure hypercholesterolemia, unspecified: Secondary | ICD-10-CM | POA: Diagnosis present

## 2016-08-12 DIAGNOSIS — E119 Type 2 diabetes mellitus without complications: Secondary | ICD-10-CM

## 2016-08-12 DIAGNOSIS — K219 Gastro-esophageal reflux disease without esophagitis: Secondary | ICD-10-CM | POA: Diagnosis present

## 2016-08-12 DIAGNOSIS — L02619 Cutaneous abscess of unspecified foot: Secondary | ICD-10-CM | POA: Diagnosis present

## 2016-08-12 DIAGNOSIS — L03119 Cellulitis of unspecified part of limb: Secondary | ICD-10-CM | POA: Diagnosis present

## 2016-08-12 DIAGNOSIS — I471 Supraventricular tachycardia: Secondary | ICD-10-CM | POA: Diagnosis present

## 2016-08-12 DIAGNOSIS — T380X5A Adverse effect of glucocorticoids and synthetic analogues, initial encounter: Secondary | ICD-10-CM | POA: Diagnosis present

## 2016-08-12 DIAGNOSIS — Z7902 Long term (current) use of antithrombotics/antiplatelets: Secondary | ICD-10-CM

## 2016-08-12 DIAGNOSIS — E1165 Type 2 diabetes mellitus with hyperglycemia: Secondary | ICD-10-CM | POA: Diagnosis present

## 2016-08-12 DIAGNOSIS — Z8711 Personal history of peptic ulcer disease: Secondary | ICD-10-CM | POA: Diagnosis not present

## 2016-08-12 DIAGNOSIS — Z91048 Other nonmedicinal substance allergy status: Secondary | ICD-10-CM | POA: Diagnosis not present

## 2016-08-12 DIAGNOSIS — Z90710 Acquired absence of both cervix and uterus: Secondary | ICD-10-CM | POA: Diagnosis not present

## 2016-08-12 DIAGNOSIS — F17210 Nicotine dependence, cigarettes, uncomplicated: Secondary | ICD-10-CM | POA: Diagnosis present

## 2016-08-12 DIAGNOSIS — R0602 Shortness of breath: Secondary | ICD-10-CM | POA: Diagnosis present

## 2016-08-12 DIAGNOSIS — Z9981 Dependence on supplemental oxygen: Secondary | ICD-10-CM | POA: Diagnosis not present

## 2016-08-12 DIAGNOSIS — Z79899 Other long term (current) drug therapy: Secondary | ICD-10-CM

## 2016-08-12 DIAGNOSIS — Z794 Long term (current) use of insulin: Secondary | ICD-10-CM | POA: Diagnosis not present

## 2016-08-12 DIAGNOSIS — Z85820 Personal history of malignant melanoma of skin: Secondary | ICD-10-CM | POA: Diagnosis not present

## 2016-08-12 DIAGNOSIS — E1151 Type 2 diabetes mellitus with diabetic peripheral angiopathy without gangrene: Secondary | ICD-10-CM | POA: Diagnosis present

## 2016-08-12 DIAGNOSIS — F419 Anxiety disorder, unspecified: Secondary | ICD-10-CM | POA: Diagnosis present

## 2016-08-12 DIAGNOSIS — I5033 Acute on chronic diastolic (congestive) heart failure: Secondary | ICD-10-CM

## 2016-08-12 DIAGNOSIS — J441 Chronic obstructive pulmonary disease with (acute) exacerbation: Secondary | ICD-10-CM | POA: Diagnosis present

## 2016-08-12 DIAGNOSIS — Z7984 Long term (current) use of oral hypoglycemic drugs: Secondary | ICD-10-CM | POA: Diagnosis not present

## 2016-08-12 DIAGNOSIS — M797 Fibromyalgia: Secondary | ICD-10-CM | POA: Diagnosis present

## 2016-08-12 LAB — GLUCOSE, CAPILLARY
GLUCOSE-CAPILLARY: 206 mg/dL — AB (ref 65–99)
GLUCOSE-CAPILLARY: 343 mg/dL — AB (ref 65–99)
Glucose-Capillary: 408 mg/dL — ABNORMAL HIGH (ref 65–99)

## 2016-08-12 LAB — BLOOD GAS, ARTERIAL
Acid-Base Excess: 8.8 mmol/L — ABNORMAL HIGH (ref 0.0–2.0)
Bicarbonate: 32.1 mmol/L — ABNORMAL HIGH (ref 20.0–28.0)
Drawn by: 221791
O2 CONTENT: 2 L/min
O2 SAT: 96.5 %
PATIENT TEMPERATURE: 37
PCO2 ART: 45.4 mmHg (ref 32.0–48.0)
pH, Arterial: 7.472 — ABNORMAL HIGH (ref 7.350–7.450)
pO2, Arterial: 87 mmHg (ref 83.0–108.0)

## 2016-08-12 LAB — CBC WITH DIFFERENTIAL/PLATELET
BASOS ABS: 0 10*3/uL (ref 0.0–0.1)
Basophils Relative: 0 %
EOS PCT: 3 %
Eosinophils Absolute: 0.3 10*3/uL (ref 0.0–0.7)
HEMATOCRIT: 37.1 % (ref 36.0–46.0)
Hemoglobin: 12.2 g/dL (ref 12.0–15.0)
LYMPHS ABS: 1.3 10*3/uL (ref 0.7–4.0)
LYMPHS PCT: 12 %
MCH: 29.2 pg (ref 26.0–34.0)
MCHC: 32.9 g/dL (ref 30.0–36.0)
MCV: 88.8 fL (ref 78.0–100.0)
MONO ABS: 0.8 10*3/uL (ref 0.1–1.0)
MONOS PCT: 8 %
NEUTROS ABS: 7.9 10*3/uL — AB (ref 1.7–7.7)
Neutrophils Relative %: 77 %
PLATELETS: 238 10*3/uL (ref 150–400)
RBC: 4.18 MIL/uL (ref 3.87–5.11)
RDW: 12.7 % (ref 11.5–15.5)
WBC: 10.3 10*3/uL (ref 4.0–10.5)

## 2016-08-12 LAB — BASIC METABOLIC PANEL
ANION GAP: 9 (ref 5–15)
BUN: 9 mg/dL (ref 6–20)
CALCIUM: 8.7 mg/dL — AB (ref 8.9–10.3)
CO2: 33 mmol/L — ABNORMAL HIGH (ref 22–32)
Chloride: 98 mmol/L — ABNORMAL LOW (ref 101–111)
Creatinine, Ser: 0.61 mg/dL (ref 0.44–1.00)
GFR calc Af Amer: >60 mL/min (ref >60)
Glucose, Bld: 150 mg/dL — ABNORMAL HIGH (ref 65–99)
Potassium: 3.5 mmol/L (ref 3.5–5.1)
Sodium: 140 mmol/L (ref 135–145)

## 2016-08-12 LAB — TROPONIN I: Troponin I: <0.03 ng/mL (ref <0.03)

## 2016-08-12 LAB — BRAIN NATRIURETIC PEPTIDE: B Natriuretic Peptide: 449 pg/mL — ABNORMAL HIGH (ref 0.0–100.0)

## 2016-08-12 MED ORDER — IPRATROPIUM-ALBUTEROL 0.5-2.5 (3) MG/3ML IN SOLN
3.0000 mL | Freq: Once | RESPIRATORY_TRACT | Status: AC
  Administered 2016-08-12: 3 mL via RESPIRATORY_TRACT
  Filled 2016-08-12: qty 3

## 2016-08-12 MED ORDER — MIDODRINE HCL 5 MG PO TABS
5.0000 mg | ORAL_TABLET | Freq: Three times a day (TID) | ORAL | Status: DC
  Administered 2016-08-13 (×3): 5 mg via ORAL
  Filled 2016-08-12 (×5): qty 1

## 2016-08-12 MED ORDER — FUROSEMIDE 10 MG/ML IJ SOLN
40.0000 mg | Freq: Once | INTRAMUSCULAR | Status: AC
  Administered 2016-08-12: 40 mg via INTRAVENOUS
  Filled 2016-08-12: qty 4

## 2016-08-12 MED ORDER — ONDANSETRON HCL 4 MG PO TABS: 4.0000 mg | ORAL_TABLET | Freq: Four times a day (QID) | ORAL | Status: DC | PRN

## 2016-08-12 MED ORDER — PANTOPRAZOLE SODIUM 40 MG PO TBEC
40.0000 mg | DELAYED_RELEASE_TABLET | Freq: Every day | ORAL | Status: DC
  Administered 2016-08-12 – 2016-08-14 (×3): 40 mg via ORAL
  Filled 2016-08-12 (×3): qty 1

## 2016-08-12 MED ORDER — INSULIN ASPART 100 UNIT/ML ~~LOC~~ SOLN
0.0000 [IU] | Freq: Every day | SUBCUTANEOUS | Status: DC
  Administered 2016-08-12: 5 [IU] via SUBCUTANEOUS
  Administered 2016-08-13: 4 [IU] via SUBCUTANEOUS

## 2016-08-12 MED ORDER — MOMETASONE FURO-FORMOTEROL FUM 200-5 MCG/ACT IN AERO
2.0000 | INHALATION_SPRAY | Freq: Two times a day (BID) | RESPIRATORY_TRACT | Status: DC
  Administered 2016-08-12 – 2016-08-14 (×4): 2 via RESPIRATORY_TRACT
  Filled 2016-08-12: qty 8.8

## 2016-08-12 MED ORDER — ALBUTEROL SULFATE (2.5 MG/3ML) 0.083% IN NEBU
2.5000 mg | INHALATION_SOLUTION | Freq: Once | RESPIRATORY_TRACT | Status: AC
  Administered 2016-08-12: 2.5 mg via RESPIRATORY_TRACT
  Filled 2016-08-12: qty 3

## 2016-08-12 MED ORDER — INSULIN GLARGINE 100 UNIT/ML ~~LOC~~ SOLN
20.0000 [IU] | Freq: Every day | SUBCUTANEOUS | Status: DC
  Administered 2016-08-12 – 2016-08-13 (×2): 20 [IU] via SUBCUTANEOUS
  Filled 2016-08-12 (×3): qty 0.2

## 2016-08-12 MED ORDER — CIPROFLOXACIN HCL 250 MG PO TABS
500.0000 mg | ORAL_TABLET | Freq: Two times a day (BID) | ORAL | Status: DC
  Administered 2016-08-12 – 2016-08-14 (×5): 500 mg via ORAL
  Filled 2016-08-12 (×5): qty 2

## 2016-08-12 MED ORDER — METHYLPREDNISOLONE SODIUM SUCC 40 MG IJ SOLR
40.0000 mg | Freq: Four times a day (QID) | INTRAMUSCULAR | Status: DC
  Administered 2016-08-12 – 2016-08-14 (×9): 40 mg via INTRAVENOUS
  Filled 2016-08-12 (×9): qty 1

## 2016-08-12 MED ORDER — NITROGLYCERIN 2 % TD OINT
1.0000 [in_us] | TOPICAL_OINTMENT | Freq: Once | TRANSDERMAL | Status: AC
  Administered 2016-08-12: 1 [in_us] via TOPICAL
  Filled 2016-08-12: qty 1

## 2016-08-12 MED ORDER — INSULIN ASPART 100 UNIT/ML ~~LOC~~ SOLN
4.0000 [IU] | Freq: Three times a day (TID) | SUBCUTANEOUS | Status: DC
  Administered 2016-08-12 – 2016-08-14 (×7): 4 [IU] via SUBCUTANEOUS

## 2016-08-12 MED ORDER — CLOPIDOGREL BISULFATE 75 MG PO TABS
75.0000 mg | ORAL_TABLET | Freq: Every day | ORAL | Status: DC
  Administered 2016-08-12 – 2016-08-14 (×3): 75 mg via ORAL
  Filled 2016-08-12 (×3): qty 1

## 2016-08-12 MED ORDER — TRAMADOL HCL 50 MG PO TABS
50.0000 mg | ORAL_TABLET | Freq: Four times a day (QID) | ORAL | Status: DC | PRN
  Administered 2016-08-12 (×2): 50 mg via ORAL
  Filled 2016-08-12 (×2): qty 1

## 2016-08-12 MED ORDER — ROSUVASTATIN CALCIUM 20 MG PO TABS
40.0000 mg | ORAL_TABLET | Freq: Every day | ORAL | Status: DC
  Administered 2016-08-12 – 2016-08-14 (×3): 40 mg via ORAL
  Filled 2016-08-12 (×3): qty 2

## 2016-08-12 MED ORDER — METFORMIN HCL 500 MG PO TABS
500.0000 mg | ORAL_TABLET | Freq: Two times a day (BID) | ORAL | Status: DC
  Administered 2016-08-12 – 2016-08-14 (×4): 500 mg via ORAL
  Filled 2016-08-12 (×4): qty 1

## 2016-08-12 MED ORDER — METHYLPREDNISOLONE SODIUM SUCC 125 MG IJ SOLR
125.0000 mg | Freq: Once | INTRAMUSCULAR | Status: AC
  Administered 2016-08-12: 125 mg via INTRAVENOUS
  Filled 2016-08-12: qty 2

## 2016-08-12 MED ORDER — DULOXETINE HCL 60 MG PO CPEP
60.0000 mg | ORAL_CAPSULE | Freq: Two times a day (BID) | ORAL | Status: DC
  Administered 2016-08-12 – 2016-08-14 (×5): 60 mg via ORAL
  Filled 2016-08-12 (×5): qty 1

## 2016-08-12 MED ORDER — LEVALBUTEROL HCL 0.63 MG/3ML IN NEBU
0.6300 mg | INHALATION_SOLUTION | Freq: Four times a day (QID) | RESPIRATORY_TRACT | Status: DC
  Administered 2016-08-12 – 2016-08-14 (×6): 0.63 mg via RESPIRATORY_TRACT
  Filled 2016-08-12 (×7): qty 3

## 2016-08-12 MED ORDER — INSULIN ASPART 100 UNIT/ML ~~LOC~~ SOLN
0.0000 [IU] | Freq: Three times a day (TID) | SUBCUTANEOUS | Status: DC
  Administered 2016-08-12: 5 [IU] via SUBCUTANEOUS
  Administered 2016-08-12: 11 [IU] via SUBCUTANEOUS
  Administered 2016-08-13: 8 [IU] via SUBCUTANEOUS
  Administered 2016-08-13: 3 [IU] via SUBCUTANEOUS
  Administered 2016-08-13: 8 [IU] via SUBCUTANEOUS
  Administered 2016-08-14: 5 [IU] via SUBCUTANEOUS
  Administered 2016-08-14: 8 [IU] via SUBCUTANEOUS

## 2016-08-12 MED ORDER — ASPIRIN EC 81 MG PO TBEC
81.0000 mg | DELAYED_RELEASE_TABLET | Freq: Every day | ORAL | Status: DC
  Administered 2016-08-12 – 2016-08-14 (×3): 81 mg via ORAL
  Filled 2016-08-12 (×3): qty 1

## 2016-08-12 MED ORDER — FUROSEMIDE 10 MG/ML IJ SOLN
40.0000 mg | Freq: Two times a day (BID) | INTRAMUSCULAR | Status: DC
  Administered 2016-08-12 – 2016-08-14 (×5): 40 mg via INTRAVENOUS
  Filled 2016-08-12 (×4): qty 4

## 2016-08-12 MED ORDER — HEPARIN SODIUM (PORCINE) 5000 UNIT/ML IJ SOLN
5000.0000 [IU] | Freq: Three times a day (TID) | INTRAMUSCULAR | Status: DC
  Administered 2016-08-12 – 2016-08-14 (×6): 5000 [IU] via SUBCUTANEOUS
  Filled 2016-08-12 (×6): qty 1

## 2016-08-12 MED ORDER — ONDANSETRON HCL 4 MG/2ML IJ SOLN: 4.0000 mg | Freq: Four times a day (QID) | INTRAMUSCULAR | Status: DC | PRN

## 2016-08-12 MED ORDER — ALPRAZOLAM 0.5 MG PO TABS
0.5000 mg | ORAL_TABLET | Freq: Three times a day (TID) | ORAL | Status: DC | PRN
  Administered 2016-08-12 – 2016-08-13 (×2): 0.5 mg via ORAL
  Filled 2016-08-12 (×2): qty 1

## 2016-08-12 MED ORDER — FLUTICASONE PROPIONATE 50 MCG/ACT NA SUSP
2.0000 | Freq: Every day | NASAL | Status: DC
  Administered 2016-08-12 – 2016-08-14 (×3): 2 via NASAL
  Filled 2016-08-12: qty 16

## 2016-08-12 NOTE — ED Triage Notes (Signed)
Pt discharged from the hospital after a stay for infection in her foot, states she want to stay with a relative and did not have her oxygen there with her.  Pt states she started getting more sob as the time went on.  Pt states she feels a little better now with O2.  Pt denies pain or other complaints

## 2016-08-12 NOTE — ED Notes (Signed)
Pt stable and ready for transport. Report given to Threasa Alpha, RN.

## 2016-08-12 NOTE — ED Provider Notes (Signed)
[facility] DEPT Provider Note   CSN: [id number] Arrival date & time: 08/12/16  0458     History   Chief Complaint Chief Complaint  Patient presents with  . Shortness of Breath    HPI Jacqueline Hansen is a 76 y.o. female.  Patient presents by EMS with progressively worsening shortness of breath onset this evening. She was discharged from the hospital yesterday after admission for foot abscess and cellulitis. She will stay with her ex-husband and did not have her as needed home oxygen. Her breathing progressively got worse throughout the course of the night with a cough productive of green mucus. She denies any chest pain. She denies any fever. She is currently taking Cipro for her foot infection. She was given oxygen and nebulizer by EMS with some improvement. She also has a history of heart failure but denies any CAD history.   The history is provided by the EMS personnel and the patient. The history is limited by the condition of the patient.  Shortness of Breath  Associated symptoms include leg swelling. Pertinent negatives include no headaches, no rhinorrhea, no chest pain, no vomiting and no abdominal pain.    Past Medical History:  Diagnosis Date  . Anxiety   . Arthritis    "joints" (09/08/2014)  . Asthma   . CHF (congestive heart failure) ([place])   . Chronic bronchitis ([place])    "get it q yr"  . COPD (chronic obstructive pulmonary disease) ([place])   . Depression   . Fibromyalgia   . Gastric ulcer   . GERD (gastroesophageal reflux disease)   . 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 History of blood transfusion ?2015   "cause I was throwing it up"  . History of hiatal hernia   . Hypercholesterolemia   . 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 On home oxygen therapy    "2L ususally at night time" (09/08/2014)  . Pulmonary embolism ([place])    "when I had my gallbladder taken out"  . Type II diabetes mellitus [facility])     Patient Active Problem List   Diagnosis Date Noted  . Cellulitis and abscess of foot   . Cellulitis 08/06/2016  . Benign neoplasm of sigmoid colon   . Rectal ulceration   . GI bleed 09/07/2014  . Colitis 09/07/2014  . COPD (chronic obstructive pulmonary disease) ([place]) 09/07/2014  . DM (diabetes mellitus) ([place]) 09/07/2014  . PAD (peripheral artery disease) ([place]) 09/07/2014  . Urinary retention 09/07/2014    Past Surgical History:  Procedure Laterality Date  . ABDOMINAL HYSTERECTOMY    . CARPAL TUNNEL RELEASE Bilateral   . CATARACT EXTRACTION W/ INTRAOCULAR LENS  IMPLANT, BILATERAL Bilateral   . COLONOSCOPY N/A 09/09/2014   Procedure: COLONOSCOPY;  Surgeon: Irene Shipper, MD;  Location: [facility];  Service: Endoscopy;  Laterality: N/A;  . EXCISIONAL HEMORRHOIDECTOMY    . INCISION AND DRAINAGE ABSCESS Right 08/09/2016   Procedure: INCISION AND DRAINAGE ABSCESS;  Surgeon: Aviva Signs, MD;  Location: [facility];  Service: General;  Laterality: Right;  . LAPAROSCOPIC CHOLECYSTECTOMY    . MELANOMA EXCISION     "off my nose"  . TUBAL LIGATION      OB History    No data available       Home Medications    Prior to Admission medications   Medication Sig Start Date End Date Taking? Authorizing Provider  albuterol (PROVENTIL HFA;VENTOLIN HFA) 108 (90 Base) MCG/ACT inhaler  Inhale 1-2 puffs into the lungs every 6 (six) hours as needed for wheezing or shortness of breath.    [provider]  ALPRAZolam Duanne Moron) 0.5 MG tablet Take 0.5 mg by mouth 3 (three) times daily as needed for anxiety.     [provider]  aspirin EC 81 MG tablet Take 81 mg by mouth daily.    [provider]  ciprofloxacin (CIPRO) 500 MG tablet Take 1 tablet (500 mg total) by mouth 2 (two) times daily. 08/11/16   Orvan Falconer, MD  clopidogrel (PLAVIX) 75 MG tablet Take 75 mg by mouth daily.    [provider]  DULoxetine (CYMBALTA) 60 MG capsule Take 60 mg by mouth 2 (two) times daily.    [provider]    esomeprazole (NEXIUM) 40 MG capsule Take 40 mg by mouth daily at 12 noon.    [provider]  fluticasone (FLONASE) 50 MCG/ACT nasal spray Place 2 sprays into both nostrils daily.    [provider]  Fluticasone-Salmeterol (ADVAIR) 250-50 MCG/DOSE AEPB Inhale 1 puff into the lungs 2 (two) times daily.    [provider]  insulin glargine (LANTUS) 100 UNIT/ML injection Inject 0.2 mLs (20 Units total) into the skin at bedtime. 08/11/16   Orvan Falconer, MD  metFORMIN (GLUCOPHAGE) 500 MG tablet Take 500 mg by mouth 2 (two) times daily with a meal.     [provider]  midodrine (PROAMATINE) 5 MG tablet Take 5 mg by mouth 3 (three) times daily.    [provider]  rosuvastatin (CRESTOR) 40 MG tablet Take 40 mg by mouth daily.    [provider]    Family History No family history on file.  Social History Social History  Substance Use Topics  . Smoking status: Current Every Day Smoker    Packs/day: 0.50    Years: 32.00    Types: Cigarettes  . Smokeless tobacco: Never Used  . Alcohol use Yes     Comment: 09/08/2014 "I drank a little bit a long time ago"     Allergies   Adhesive [tape]   Review of Systems Review of Systems  Constitutional: Positive for activity change, appetite change and fatigue.  HENT: Negative for congestion and rhinorrhea.   Respiratory: Positive for shortness of breath. Negative for chest tightness.   Cardiovascular: Positive for leg swelling. Negative for chest pain.  Gastrointestinal: Negative for abdominal pain, nausea and vomiting.  Genitourinary: Negative for dysuria and hematuria.  Musculoskeletal: Negative for arthralgias and myalgias.  Neurological: Negative for dizziness, weakness and headaches.   all other systems are negative except as noted in the HPI and PMH.     Physical Exam Updated Vital Signs BP (!) 191/92 (BP Location: Left Arm)   Pulse 91   Temp 97.8 F (36.6 C) (Oral)   Resp (!) 26   Ht  5\' 2"  (1.575 m)   Wt 94.3 kg (208 lb)   SpO2 96%   BMI 38.04 kg/m   Physical Exam  Constitutional: She is oriented to person, place, and time. She appears well-developed and well-nourished. She appears distressed.  Speaking in short phrases, increased work of breathing  HENT:  Head: Normocephalic and atraumatic.  Mouth/Throat: Oropharynx is clear and moist. No oropharyngeal exudate.  Eyes: Conjunctivae and EOM are normal. Pupils are equal, round, and reactive to light.  Neck: Normal range of motion. Neck supple.  No meningismus.  Cardiovascular: Normal rate, regular rhythm, normal heart sounds and intact distal pulses.  No murmur heard. Pulmonary/Chest: She is in respiratory distress. She has wheezes.  Diffuse and respiratory expiratory wheezing  Abdominal: Soft. There is no tenderness. There is no rebound and no guarding.  Musculoskeletal: Normal range of motion. She exhibits no edema or tenderness.  Surgical dressing in place to right foot  Neurological: She is alert and oriented to person, place, and time. No cranial nerve deficit. She exhibits normal muscle tone. Coordination normal.   5/5 strength throughout. CN 2-12 intact.Equal grip strength.   Skin: Skin is warm.  Psychiatric: She has a normal mood and affect. Her behavior is normal.  Nursing note and vitals reviewed.    ED Treatments / Results  Labs (all labs ordered are listed, but only abnormal results are displayed) Labs Reviewed  CBC WITH DIFFERENTIAL/PLATELET - Abnormal; Notable for the following:       Result Value   Neutro Abs 7.9 (*)    All other components within normal limits  BASIC METABOLIC PANEL - Abnormal; Notable for the following:    Chloride 98 (*)    CO2 33 (*)    Glucose, Bld 150 (*)    Calcium 8.7 (*)    All other components within normal limits  BRAIN NATRIURETIC PEPTIDE - Abnormal; Notable for the following:    B Natriuretic Peptide 449.0 (*)    All other components within normal limits    BLOOD GAS, ARTERIAL - Abnormal; Notable for the following:    pH, Arterial 7.472 (*)    Bicarbonate 32.1 (*)    Acid-Base Excess 8.8 (*)    All other components within normal limits  TROPONIN I    EKG  EKG Interpretation None       Radiology Dg Chest Portable 1 View  Result Date: 08/12/2016 CLINICAL DATA:  Cough and shortness of breath. EXAM: PORTABLE CHEST 1 VIEW COMPARISON:  08/08/2016 FINDINGS: Cardiomegaly with possible increase. Atherosclerosis of the thoracic aorta. Again seen vascular congestion, increased. There is progressive peribronchial cuffing. Minimal fluid in the right minor fissure with possible small pleural effusions. Streaky right greater than left basilar opacity. No pneumothorax. Chronic change of the right shoulder. IMPRESSION: Progressive peribronchial cuffing suggesting pulmonary edema. Increased vascular congestion with cardiomegaly and possible small pleural effusions. Findings suggest CHF. Electronically Signed   By: Jeb Levering M.D.   On: 08/12/2016 05:56    Procedures Procedures (including critical care time)  Medications Ordered in ED Medications  methylPREDNISolone sodium succinate (SOLU-MEDROL) 125 mg/2 mL injection 125 mg (not administered)  ipratropium-albuterol (DUONEB) 0.5-2.5 (3) MG/3ML nebulizer solution 3 mL (3 mLs Nebulization Given 08/12/16 0523)  albuterol (PROVENTIL) (2.5 MG/3ML) 0.083% nebulizer solution 2.5 mg (2.5 mg Nebulization Given 08/12/16 0529)     Initial Impression / Assessment and Plan / ED Course  I have reviewed the triage vital signs and the nursing notes.  Pertinent labs & imaging results that were available during my care of the patient were reviewed by me and considered in my medical decision making (see chart for details).     Patient with history of COPD, CHF, remote pulmonary embolism, recently discharged presenting with shortness of breath in setting of not having home oxygen. She is wheezing on exam.  Patient  given nebulizers, solu- Medrol. Chest x-ray shows increasing edema as well as increased cardiomegaly.  Bedside ultrasound does show small pericardial effusion, no right heart strain IV Lasix given.  ABG shows no CO2 retention.  WOB improved with above treatments, no chest pain.   Plan admission for  further diuresis and treatment of likely COPD exacerbation. D/w Dr. Marin Comment.    EMERGENCY DEPARTMENT [place] CARDIAC EXAM "Study: Limited Ultrasound of the Heart and Pericardium"  INDICATIONS:Dyspnea Multiple views of the heart and pericardium were obtained in real-time with a multi-frequency probe.  PERFORMED NK:NLZJQB IMAGES ARCHIVED?: Yes LIMITATIONS:  Body habitus and Emergent procedure VIEWS USED: Subcostal 4 chamber and Apical 4 chamber  INTERPRETATION: Cardiac activity present, Pericardial effusion present, Cardiac tamponade absent and Decreased contractility   Final Clinical Impressions(s) / ED Diagnoses   Final diagnoses:  Acute on chronic diastolic congestive heart failure (HCC)    New Prescriptions New Prescriptions   No medications on file     Ezequiel Essex, MD 08/12/16 0900

## 2016-08-12 NOTE — H&P (Signed)
History and Physical    Jacqueline Hansen FYB:[id number] DOB: [birth year] DOA: 08/12/2016  PCP: Keane Police, MD  Patient coming from: Home.    Chief Complaint:  SOB.  HPI: Jacqueline Hansen is an 76 y.o. female with hx of COPD, anxiety, CHF, GERD, fibromyalgia, discharged by me yesterday after having her leg abscess I and D in the OR by Dr Arnoldo Morale, presented today with DOE.  She had some wheezing in the hospital, and was given one dose of IV Steroid, and did better.  Steroid was attempted to be avoided, but she was having more wheezing, and her oxygen supply ran out.  She denied fever, chills, orthopnea, or PND.  Evalaution in the ER showed cardiomegay in CXR with no infiltrate, but suggestive of pulmonary edema.  EKG showed NSR with no acute ST T changes, and troponin was negative.  She was given 2 nebs and had one episode of transient SVT, resolved spontaneously.    ED Course:  See above.  Rewiew of Systems:  Constitutional: Negative for malaise, fever and chills. No significant weight loss or weight gain Eyes: Negative for eye pain, redness and discharge, diplopia, visual changes, or flashes of light. ENMT: Negative for ear pain, hoarseness, nasal congestion, sinus pressure and sore throat. No headaches; tinnitus, drooling, or problem swallowing. Cardiovascular: Negative for chest pain, palpitations, diaphoresis, and peripheral edema. ; No orthopnea, PND Respiratory: Negative for cough, hemoptysis, wheezing and stridor. No pleuritic chestpain. Gastrointestinal: Negative for diarrhea, constipation,  melena, blood in stool, hematemesis, jaundice and rectal bleeding.    Genitourinary: Negative for frequency, dysuria, incontinence,flank pain and hematuria; Musculoskeletal: Negative for back pain and neck pain. Negative for swelling and trauma.;  Skin: . Negative for pruritus, rash, abrasions, bruising and skin lesion.; ulcerations Neuro: Negative for headache, lightheadedness and neck stiffness.  Negative for weakness, altered level of consciousness , altered mental status, extremity weakness, burning feet, involuntary movement, seizure and syncope.  Psych: negative for anxiety, depression, insomnia, tearfulness, panic attacks, hallucinations, paranoia, suicidal or homicidal ideation    Past Medical History:  Diagnosis Date  . Anxiety   . Arthritis    "joints" (09/08/2014)  . Asthma   . CHF (congestive heart failure) ([place])   . Chronic bronchitis ([place])    "get it q yr"  . COPD (chronic obstructive pulmonary disease) ([place])   . Depression   . Fibromyalgia   . Gastric ulcer   . GERD (gastroesophageal reflux disease)   . 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 History of blood transfusion ?2015   "cause I was throwing it up"  . History of hiatal hernia   . Hypercholesterolemia   . 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 On home oxygen therapy    "2L ususally at night time" (09/08/2014)  . Pulmonary embolism ([place])    "when I had my gallbladder taken out"  . Type II diabetes mellitus ([place])    Past Surgical History:  Procedure Laterality Date  . ABDOMINAL HYSTERECTOMY    . CARPAL TUNNEL RELEASE Bilateral   . CATARACT EXTRACTION W/ INTRAOCULAR LENS  IMPLANT, BILATERAL Bilateral   . COLONOSCOPY N/A 09/09/2014   Procedure: COLONOSCOPY;  Surgeon: Irene Shipper, MD;  Location: [facility];  Service: Endoscopy;  Laterality: N/A;  . EXCISIONAL HEMORRHOIDECTOMY    . INCISION AND DRAINAGE ABSCESS Right 08/09/2016   Procedure: INCISION AND DRAINAGE ABSCESS;  Surgeon: Aviva Signs, MD;  Location: [facility];  Service: General;  Laterality: Right;  . LAPAROSCOPIC CHOLECYSTECTOMY    . MELANOMA EXCISION     "off my nose"  . TUBAL LIGATION       reports that she has been smoking Cigarettes.  She has a 16.00 pack-year smoking history. She has never used smokeless tobacco. She reports that she drinks alcohol. She reports that she does not use drugs.  Allergies  Allergen  Reactions  . Adhesive [Tape] Other (See Comments)    Reaction:  Tears pts skin     No family history on file.   Prior to Admission medications   Medication Sig Start Date End Date Taking? Authorizing Provider  albuterol (PROVENTIL HFA;VENTOLIN HFA) 108 (90 Base) MCG/ACT inhaler Inhale 1-2 puffs into the lungs every 6 (six) hours as needed for wheezing or shortness of breath.    [provider]  ALPRAZolam Duanne Moron) 0.5 MG tablet Take 0.5 mg by mouth 3 (three) times daily as needed for anxiety.     [provider]  aspirin EC 81 MG tablet Take 81 mg by mouth daily.    [provider]  ciprofloxacin (CIPRO) 500 MG tablet Take 1 tablet (500 mg total) by mouth 2 (two) times daily. 08/11/16   Orvan Falconer, MD  clopidogrel (PLAVIX) 75 MG tablet Take 75 mg by mouth daily.    [provider]  DULoxetine (CYMBALTA) 60 MG capsule Take 60 mg by mouth 2 (two) times daily.    [provider]  esomeprazole (NEXIUM) 40 MG capsule Take 40 mg by mouth daily at 12 noon.    [provider]  fluticasone (FLONASE) 50 MCG/ACT nasal spray Place 2 sprays into both nostrils daily.    [provider]  Fluticasone-Salmeterol (ADVAIR) 250-50 MCG/DOSE AEPB Inhale 1 puff into the lungs 2 (two) times daily.    [provider]  insulin glargine (LANTUS) 100 UNIT/ML injection Inject 0.2 mLs (20 Units total) into the skin at bedtime. 08/11/16   Orvan Falconer, MD  metFORMIN (GLUCOPHAGE) 500 MG tablet Take 500 mg by mouth 2 (two) times daily with a meal.     [provider]  midodrine (PROAMATINE) 5 MG tablet Take 5 mg by mouth 3 (three) times daily.    [provider]  rosuvastatin (CRESTOR) 40 MG tablet Take 40 mg by mouth daily.    [provider]    Physical Exam: Vitals:   08/12/16 0700 08/12/16 0733 08/12/16 0800 08/12/16 0831  BP:    (!) 180/76  Pulse: 86  (!) 132 86  Resp:    18  Temp:      TempSrc:      SpO2: 97% 97% 96% 94%    Weight:      Height:          Constitutional: NAD, calm, comfortable Vitals:   08/12/16 0700 08/12/16 0733 08/12/16 0800 08/12/16 0831  BP:    (!) 180/76  Pulse: 86  (!) 132 86  Resp:    18  Temp:      TempSrc:      SpO2: 97% 97% 96% 94%  Weight:      Height:       Eyes: PERRL, lids and conjunctivae normal ENMT: Mucous membranes are moist. Posterior pharynx clear of any exudate or lesions.Normal dentition.  Neck: normal, supple, no masses, no thyromegaly Respiratory: no rales, but bilateral wheezing, no crackles. Normal respiratory effort. No accessory muscle use.  Cardiovascular: Regular rate and rhythm, no murmurs / rubs / gallops. No extremity  edema. 2+ pedal pulses. No carotid bruits.  Abdomen: no tenderness, no masses palpated. No hepatosplenomegaly. Bowel sounds positive.  Musculoskeletal: no clubbing / cyanosis. No joint deformity upper and lower extremities. Good ROM, no contractures. Normal muscle tone.  Skin: no rashes, lesions, ulcers. No induration Neurologic: CN 2-12 grossly intact. Sensation intact, DTR normal. Strength 5/5 in all 4.  Psychiatric: Normal judgment and insight. Alert and oriented x 3. Normal mood.     Labs on Admission: I have personally reviewed following labs and imaging studies  CBC:  Recent Labs Lab 08/06/16 2010  08/08/16 0452 08/09/16 0446 08/10/16 0446 08/11/16 0519 08/12/16 0623  WBC 14.5*  < > 15.0* 13.9* 11.6* 8.2 10.3  NEUTROABS 11.4*  --   --   --   --   --  7.9*  HGB 12.3  < > 11.7* 11.2* 11.6* 11.5* 12.2  HCT 36.1  < > 34.9* 33.3* 35.1* 35.3* 37.1  MCV 86.6  < > 86.8 86.9 88.6 89.1 88.8  PLT 158  < > 190 194 223 225 238  < > = values in this interval not displayed. Basic Metabolic Panel:  Recent Labs Lab 08/07/16 0656 08/08/16 0452 08/10/16 0446 08/11/16 0519 08/12/16 0623  NA 138 136 138 138 140  K 3.8 3.7 3.8 4.1 3.5  CL 104 103 102 101 98*  CO2 25 24 29 29  33*  GLUCOSE 251* 188* 94 210* 150*  BUN 8 9 6 8 9    CREATININE 0.55 0.58 0.59 0.60 0.61  CALCIUM 8.4* 8.3* 8.5* 8.6* 8.7*   GFR: Estimated Creatinine Clearance: 66 mL/min (by C-G formula based on SCr of 0.61 mg/dL). Liver Function Tests:  Recent Labs Lab 08/07/16 0656  AST 9*  ALT 10*  ALKPHOS 55  BILITOT 0.7  PROT 6.1*  ALBUMIN 2.5*   Cardiac Enzymes:  Recent Labs Lab 08/12/16 0623  TROPONINI <0.03   CBG:  Recent Labs Lab 08/10/16 1228 08/10/16 1738 08/10/16 2055 08/11/16 0743 08/11/16 1138  GLUCAP 96 176* 167* 184* 160*    Recent Results (from the past 240 hour(s))  MRSA PCR Screening     Status: None   Collection Time: 08/07/16 12:45 AM  Result Value Ref Range Status   MRSA by PCR NEGATIVE NEGATIVE Final    Comment:        The GeneXpert MRSA Assay (FDA approved for NASAL specimens only), is one component of a comprehensive MRSA colonization surveillance program. It is not intended to diagnose MRSA infection nor to guide or monitor treatment for MRSA infections.   Aerobic/Anaerobic Culture (surgical/deep wound)     Status: None (Preliminary result)   Collection Time: 08/09/16  8:37 AM  Result Value Ref Range Status   Specimen Description ABSCESS  Final   Special Requests NONE  Final   Gram Stain   Final    MODERATE WBC PRESENT,BOTH PMN AND MONONUCLEAR MODERATE GRAM POSITIVE COCCI IN PAIRS MODERATE GRAM VARIABLE ROD    Culture   Final    CULTURE REINCUBATED FOR BETTER GROWTH HOLDING FOR POSSIBLE ANAEROBE Performed at [facility], [place] [address] [place], [place] [address]    Report Status PENDING  Incomplete     Radiological Exams on Admission: Dg Chest Portable 1 View  Result Date: 08/12/2016 CLINICAL DATA:  Cough and shortness of breath. EXAM: PORTABLE CHEST 1 VIEW COMPARISON:  08/08/2016 FINDINGS: Cardiomegaly with possible increase. Atherosclerosis of the thoracic aorta. Again seen vascular congestion, increased. There is progressive peribronchial cuffing. Minimal fluid  in the right  minor fissure with possible small pleural effusions. Streaky right greater than left basilar opacity. No pneumothorax. Chronic change of the right shoulder. IMPRESSION: Progressive peribronchial cuffing suggesting pulmonary edema. Increased vascular congestion with cardiomegaly and possible small pleural effusions. Findings suggest CHF. Electronically Signed   By: Jeb Levering M.D.   On: 08/12/2016 05:56    EKG: Independently reviewed.   Assessment/Plan Principal Problem:   SOB (shortness of breath) Active Problems:   DM (diabetes mellitus) (HCC)   Cellulitis and abscess of foot   COPD with acute exacerbation (HCC)    PLAN:   SOB:  I suspect she has both COPD exacerbation, and a little volume overload.  Will continue with IV Steroids, nebs, and oral Cipro.  Check I/O and IV Lasix BID.  Leg abscess: Will continue with oral Cipro.  DM:  Will continue with insulin.  Use SSI.  Suspect hyperglycemia with steroid.   DVT prophylaxis: SubQ heparin.  Code Status: FULL CODE>  Family Communication: None at bedside Disposition Plan: Home.  Consults called: None Admission status: inpatient.    Reise Gladney MD FACP. [facility]  If 7PM-7AM, please contact night-coverage [email or web address] Password [email or web address]  08/12/2016, 8:59 AM

## 2016-08-13 LAB — BASIC METABOLIC PANEL
Anion gap: 12 (ref 5–15)
BUN: 16 mg/dL (ref 6–20)
CALCIUM: 8.7 mg/dL — AB (ref 8.9–10.3)
CO2: 33 mmol/L — AB (ref 22–32)
CREATININE: 0.68 mg/dL (ref 0.44–1.00)
Chloride: 93 mmol/L — ABNORMAL LOW (ref 101–111)
GFR calc Af Amer: >60 mL/min (ref >60)
GFR calc non Af Amer: >60 mL/min (ref >60)
GLUCOSE: 280 mg/dL — AB (ref 65–99)
Potassium: 3.6 mmol/L (ref 3.5–5.1)
Sodium: 138 mmol/L (ref 135–145)

## 2016-08-13 LAB — GLUCOSE, CAPILLARY
GLUCOSE-CAPILLARY: 274 mg/dL — AB (ref 65–99)
GLUCOSE-CAPILLARY: 296 mg/dL — AB (ref 65–99)
Glucose-Capillary: 287 mg/dL — ABNORMAL HIGH (ref 65–99)
Glucose-Capillary: 199 mg/dL — ABNORMAL HIGH (ref 65–99)
Glucose-Capillary: 305 mg/dL — ABNORMAL HIGH (ref 65–99)

## 2016-08-13 LAB — CBC
HCT: 39.9 % (ref 36.0–46.0)
HEMOGLOBIN: 13.2 g/dL (ref 12.0–15.0)
MCH: 28.9 pg (ref 26.0–34.0)
MCHC: 33.1 g/dL (ref 30.0–36.0)
MCV: 87.3 fL (ref 78.0–100.0)
PLATELETS: 226 10*3/uL (ref 150–400)
RBC: 4.57 MIL/uL (ref 3.87–5.11)
RDW: 12.6 % (ref 11.5–15.5)
WBC: 10.2 10*3/uL (ref 4.0–10.5)

## 2016-08-13 LAB — AEROBIC/ANAEROBIC CULTURE (SURGICAL/DEEP WOUND)

## 2016-08-13 LAB — AEROBIC/ANAEROBIC CULTURE W GRAM STAIN (SURGICAL/DEEP WOUND): Culture: NORMAL

## 2016-08-13 NOTE — Progress Notes (Signed)
PROGRESS NOTE    Jacqueline Hansen  TOI:[id number] DOB: March 01, 1941 DOA: 08/12/2016 PCP: Keane Police, MD    Brief Narrative: Jacqueline Hansen is an 76 y.o. female with hx of COPD, anxiety, CHF, GERD, fibromyalgia, discharged by me yesterday after having her leg abscess I and D in the OR by Dr Arnoldo Morale, presented today with DOE.  She had some wheezing in the hospital, and was given one dose of IV Steroid, and did better.  Steroid was attempted to be avoided, but she was having more wheezing, and her oxygen supply ran out.  She denied fever, chills, orthopnea, or PND.  Evalaution in the ER showed cardiomegay in CXR with no infiltrate, but suggestive of pulmonary edema.  EKG showed NSR with no acute ST T changes, and troponin was negative.  She was given 2 nebs and had one episode of transient SVT, resolved spontaneously.  She was admitted and given IV steroids, nebs, and IV Lasix.  Her Cipro was continued, and she felt markedly better today.    Assessment & Plan:   Principal Problem:   SOB (shortness of breath) Active Problems:   DM (diabetes mellitus) (HCC)   Cellulitis and abscess of foot   COPD with acute exacerbation (HCC)  SOB:  I suspect she has both COPD exacerbation, and a little volume overload.  Will continue with IV Steroids, nebs, and oral Cipro. She is doing better and will continue with Tx.   Probably will be able to discharge her home tomorrow.   Leg abscess: Will continue with oral Cipro.  DM:  Will continue with insulin.  Use SSI.  Suspect hyperglycemia with steroid.   DVT prophylaxis: SubQ heparin.  Code Status: FULL CODE>  Family Communication: None at bedside Disposition Plan: Home.  Consults called: None Admission status: inpatient.    Antimicrobials: Anti-infectives    Start     Dose/Rate Route Frequency Ordered Stop   08/12/16 1000  ciprofloxacin (CIPRO) tablet 500 mg     500 mg Oral 2 times daily 08/12/16 0949         Subjective:  I am doing better.    Objective: Vitals:   08/12/16 2124 08/13/16 0609 08/13/16 0754 08/13/16 1334  BP: (!) 156/75 (!) 166/86    Pulse: 87 80    Resp: 17 20    Temp: 97.7 F (36.5 C) 97.9 F (36.6 C)    TempSrc: Oral Oral    SpO2: 97% 98% 95% 93%  Weight:      Height:        Intake/Output Summary (Last 24 hours) at 08/13/16 1352 Last data filed at 08/13/16 0900  Gross per 24 hour  Intake              240 ml  Output             2100 ml  Net            -1860 ml   Filed Weights   08/12/16 0508 08/12/16 1009  Weight: 94.3 kg (208 lb) 95.6 kg (210 lb 12.2 oz)    Examination:  General exam: Appears calm and comfortable  Respiratory system: Clear to auscultation. Respiratory effort normal. Cardiovascular system: S1 & S2 heard, RRR. No JVD, murmurs, rubs, gallops or clicks. No pedal edema. Gastrointestinal system: Abdomen is nondistended, soft and nontender. No organomegaly or masses felt. Normal bowel sounds heard. Central nervous system: Alert and oriented. No focal neurological deficits. Extremities: Symmetric 5 x 5 power. Skin: No rashes, lesions or  ulcers Psychiatry: Judgement and insight appear normal. Mood & affect appropriate.   Data Reviewed: I have personally reviewed following labs and imaging studies  CBC:  Recent Labs Lab 08/06/16 2010  08/09/16 0446 08/10/16 0446 08/11/16 0519 08/12/16 0623 08/13/16 0706  WBC 14.5*  < > 13.9* 11.6* 8.2 10.3 10.2  NEUTROABS 11.4*  --   --   --   --  7.9*  --   HGB 12.3  < > 11.2* 11.6* 11.5* 12.2 13.2  HCT 36.1  < > 33.3* 35.1* 35.3* 37.1 39.9  MCV 86.6  < > 86.9 88.6 89.1 88.8 87.3  PLT 158  < > 194 223 225 238 226  < > = values in this interval not displayed. Basic Metabolic Panel:  Recent Labs Lab 08/08/16 0452 08/10/16 0446 08/11/16 0519 08/12/16 0623 08/13/16 0706  NA 136 138 138 140 138  K 3.7 3.8 4.1 3.5 3.6  CL 103 102 101 98* 93*  CO2 24 29 29  33* 33*  GLUCOSE 188* 94 210* 150* 280*  BUN 9 6 8 9 16   CREATININE 0.58  0.59 0.60 0.61 0.68  CALCIUM 8.3* 8.5* 8.6* 8.7* 8.7*   Liver Function Tests:  Recent Labs Lab 08/07/16 0656  AST 9*  ALT 10*  ALKPHOS 55  BILITOT 0.7  PROT 6.1*  ALBUMIN 2.5*   Cardiac Enzymes:  Recent Labs Lab 08/12/16 0623  TROPONINI <0.03   CBG:  Recent Labs Lab 08/12/16 1629 08/12/16 2119 08/12/16 2352 08/13/16 0725 08/13/16 1145  GLUCAP 343* 408* 296* 274* 287*    Recent Results (from the past 240 hour(s))  MRSA PCR Screening     Status: None   Collection Time: 08/07/16 12:45 AM  Result Value Ref Range Status   MRSA by PCR NEGATIVE NEGATIVE Final    Comment:        The GeneXpert MRSA Assay (FDA approved for NASAL specimens only), is one component of a comprehensive MRSA colonization surveillance program. It is not intended to diagnose MRSA infection nor to guide or monitor treatment for MRSA infections.   Aerobic/Anaerobic Culture (surgical/deep wound)     Status: None (Preliminary result)   Collection Time: 08/09/16  8:37 AM  Result Value Ref Range Status   Specimen Description ABSCESS  Final   Special Requests NONE  Final   Gram Stain   Final    MODERATE WBC PRESENT,BOTH PMN AND MONONUCLEAR MODERATE GRAM POSITIVE COCCI IN PAIRS MODERATE GRAM VARIABLE ROD    Culture   Final    CULTURE REINCUBATED FOR BETTER GROWTH HOLDING FOR POSSIBLE ANAEROBE Performed at [facility], [place] [address] [place], [place] [address]    Report Status PENDING  Incomplete     Radiology Studies: Dg Chest Portable 1 View  Result Date: 08/12/2016 CLINICAL DATA:  Cough and shortness of breath. EXAM: PORTABLE CHEST 1 VIEW COMPARISON:  08/08/2016 FINDINGS: Cardiomegaly with possible increase. Atherosclerosis of the thoracic aorta. Again seen vascular congestion, increased. There is progressive peribronchial cuffing. Minimal fluid in the right minor fissure with possible small pleural effusions. Streaky right greater than left basilar opacity. No pneumothorax.  Chronic change of the right shoulder. IMPRESSION: Progressive peribronchial cuffing suggesting pulmonary edema. Increased vascular congestion with cardiomegaly and possible small pleural effusions. Findings suggest CHF. Electronically Signed   By: Jeb Levering M.D.   On: 08/12/2016 05:56    Scheduled Meds: . aspirin EC  81 mg Oral Daily  . ciprofloxacin  500 mg Oral BID  .  clopidogrel  75 mg Oral Daily  . DULoxetine  60 mg Oral BID  . fluticasone  2 spray Each Nare Daily  . furosemide  40 mg Intravenous BID  . heparin  5,000 Units Subcutaneous Q8H  . insulin aspart  0-15 Units Subcutaneous TID WC  . insulin aspart  0-5 Units Subcutaneous QHS  . insulin aspart  4 Units Subcutaneous TID WC  . insulin glargine  20 Units Subcutaneous QHS  . levalbuterol  0.63 mg Nebulization Q6H  . metFORMIN  500 mg Oral BID WC  . methylPREDNISolone (SOLU-MEDROL) injection  40 mg Intravenous Q6H  . midodrine  5 mg Oral TID AC  . mometasone-formoterol  2 puff Inhalation BID  . pantoprazole  40 mg Oral Daily  . rosuvastatin  40 mg Oral Daily   Continuous Infusions:   LOS: 1 day   Adelie Croswell, MD FACP Hospitalist.   If 7PM-7AM, please contact night-coverage [email or web address] Password [email or web address] 08/13/2016, 1:52 PM

## 2016-08-14 DIAGNOSIS — L02619 Cutaneous abscess of unspecified foot: Secondary | ICD-10-CM

## 2016-08-14 DIAGNOSIS — L03119 Cellulitis of unspecified part of limb: Secondary | ICD-10-CM

## 2016-08-14 LAB — GLUCOSE, CAPILLARY
GLUCOSE-CAPILLARY: 234 mg/dL — AB (ref 65–99)
GLUCOSE-CAPILLARY: 268 mg/dL — AB (ref 65–99)

## 2016-08-14 MED ORDER — PREDNISONE 10 MG (21) PO TBPK: ORAL_TABLET | ORAL | 0 refills | Status: DC

## 2016-08-14 MED ORDER — FUROSEMIDE 20 MG PO TABS: 20.0000 mg | ORAL_TABLET | Freq: Every day | ORAL | 11 refills | Status: DC

## 2016-08-14 MED ORDER — POTASSIUM CHLORIDE ER 10 MEQ PO TBCR: 10.0000 meq | EXTENDED_RELEASE_TABLET | Freq: Every day | ORAL | 0 refills | Status: DC

## 2016-08-14 NOTE — Progress Notes (Signed)
Patient awaiting ride home with daughter. IV removed and site intact. Patient has all personal belongings and prescriptions.

## 2016-08-14 NOTE — Discharge Summary (Signed)
Physician Discharge Summary  Jacqueline Hansen RSW:[id number] DOB: 02/08/1941 DOA: 08/12/2016  PCP: Keane Police, MD  Admit date: 08/12/2016 Discharge date: 08/14/2016  Admitted From: Home.  Disposition: Home.   Recommendations for Outpatient Follow-up:  1. Follow up with PCP in 1-2 weeks 2. Follow up with Dr Arnoldo Morale as scheduled.   Home Health: Already in place.  Equipment/Devices: None.  Discharge Condition: No SOB, no wheezing.  CODE STATUS: FULL CODE.  Diet recommendation: Carb modified cardiac diet.   Brief/Interim Summary:  Patient recently admitted for foot abscess, had I and D, readmitted for COPD exacerbation and CHF by me on August 12, 2016.  As per my H and P:  "  Jacqueline Hansen is an 76 y.o. female with hx of COPD, anxiety, CHF, GERD, fibromyalgia, discharged by me yesterday after having her leg abscess I and D in the OR by Dr Arnoldo Morale, presented today with DOE.  She had some wheezing in the hospital, and was given one dose of IV Steroid, and did better.  Steroid was attempted to be avoided, but she was having more wheezing, and her oxygen supply ran out.  She denied fever, chills, orthopnea, or PND.  Evalaution in the ER showed cardiomegay in CXR with no infiltrate, but suggestive of pulmonary edema.  EKG showed NSR with no acute ST T changes, and troponin was negative.  She was given 2 nebs and had one episode of transient SVT, resolved spontaneously.   HOSPITAL COURSE:  Patient was recently admittted for cellulitis and foot abscess discharged on Cipro, re-admitted for COPD exacerbation and CHF this time, and she was given IV steroids, nebs, and her Cipro for her cellulitis was continued.  She improved the following day, but was kept another day for further diuresis.  Her Cr remained stable as well as her K.  Her CBG was covered with SSI.  After 2 days of Tx, her lungs sounded clear, with no wheezing or rales, and she was discharged on low dose lasix, along with a rapid steroid taper.  She will  continue with Cipro, and follow up with Dr Arnoldo Morale, as scheduled.  She will follow up with her PCP next week.  Continue with her carb modified diet, check her CBG, and avoid running out of her oxygen.  Thank you for allowing me to pariticipate in her care.  Good Day.   Discharge Diagnoses:  Principal Problem:   SOB (shortness of breath) Active Problems:   DM (diabetes mellitus) (HCC)   Cellulitis and abscess of foot   COPD with acute exacerbation [facility])    Discharge Instructions  Discharge Instructions    Diet - low sodium heart healthy    Complete by:  As directed    Increase activity slowly    Complete by:  As directed      Allergies as of 08/14/2016      Reactions   Adhesive [tape] Other (See Comments)   Reaction:  Tears pts skin       Medication List    TAKE these medications   albuterol 108 (90 Base) MCG/ACT inhaler Commonly known as:  PROVENTIL HFA;VENTOLIN HFA Inhale 1-2 puffs into the lungs every 6 (six) hours as needed for wheezing or shortness of breath.   ALPRAZolam 0.5 MG tablet Commonly known as:  XANAX Take 0.5 mg by mouth 3 (three) times daily as needed for anxiety.   aspirin EC 81 MG tablet Take 81 mg by mouth daily.   ciprofloxacin 500 MG tablet Commonly known as:  CIPRO Take 1 tablet (500 mg total) by mouth 2 (two) times daily.   clopidogrel 75 MG tablet Commonly known as:  PLAVIX Take 75 mg by mouth daily.   DULoxetine 60 MG capsule Commonly known as:  CYMBALTA Take 60 mg by mouth 2 (two) times daily.   esomeprazole 40 MG capsule Commonly known as:  NEXIUM Take 40 mg by mouth daily at 12 noon.   fluticasone 50 MCG/ACT nasal spray Commonly known as:  FLONASE Place 2 sprays into both nostrils daily.   Fluticasone-Salmeterol 250-50 MCG/DOSE Aepb Commonly known as:  ADVAIR Inhale 1 puff into the lungs 2 (two) times daily.   furosemide 20 MG tablet Commonly known as:  LASIX Take 1 tablet (20 mg total) by mouth daily.   insulin glargine  100 UNIT/ML injection Commonly known as:  LANTUS Inject 0.2 mLs (20 Units total) into the skin at bedtime.   metFORMIN 500 MG tablet Commonly known as:  GLUCOPHAGE Take 500 mg by mouth 2 (two) times daily with a meal.   midodrine 5 MG tablet Commonly known as:  PROAMATINE Take 5 mg by mouth 3 (three) times daily.   potassium chloride 10 MEQ tablet Commonly known as:  K-DUR Take 1 tablet (10 mEq total) by mouth daily.   predniSONE 10 MG (21) Tbpk tablet Commonly known as:  STERAPRED UNI-PAK 21 TAB Use as directed.   rosuvastatin 40 MG tablet Commonly known as:  CRESTOR Take 40 mg by mouth daily.       Allergies  Allergen Reactions  . Adhesive [Tape] Other (See Comments)    Reaction:  Tears pts skin     Consultations:  None.    Procedures/Studies: Mr Foot Right W Wo Contrast  Result Date: 08/08/2016 CLINICAL DATA:  Right foot pain and swelling. EXAM: MRI OF THE RIGHT FOREFOOT WITHOUT AND WITH CONTRAST TECHNIQUE: Multiplanar, multisequence MR imaging of the right forefoot was performed before and after the administration of intravenous contrast. CONTRAST:  76mL MULTIHANCE GADOBENATE DIMEGLUMINE 529 MG/ML IV SOLN COMPARISON:  None. FINDINGS: Bones/Joint/Cartilage No marrow signal abnormality. No fracture or dislocation. Normal alignment. No joint effusion. Moderate osteoarthritis of the first MTP joint. Ligaments Collateral ligaments are intact. Muscles and Tendons Flexor, peroneal and extensor compartment tendons are intact. Muscles are normal in signal. No muscle atrophy. Soft tissue Generalized soft tissue edema and enhancement of the midfoot and forefoot. Focal area of nonenhancing tissue between the second and third metatarsal heads and toes measuring approximately 2.6 x 3.2 x 2.8 cm with interspersed areas rounded low signal foci likely reflecting air most concerning for necrotic tissue. No focal fluid collection. No soft tissue mass. IMPRESSION: 1. No evidence of  osteomyelitis of the right forefoot. 2. Cellulitis of the midfoot and forefoot. 3. Focal area of nonenhancing tissue between the second and third metatarsal heads and toes measuring approximately 2.6 x 3.2 x 2.8 cm with interspersed areas rounded low signal foci likely reflecting air most concerning for necrotic tissue. Electronically Signed   By: Kathreen Devoid   On: 08/08/2016 13:37   Dg Chest Portable 1 View  Result Date: 08/12/2016 CLINICAL DATA:  Cough and shortness of breath. EXAM: PORTABLE CHEST 1 VIEW COMPARISON:  08/08/2016 FINDINGS: Cardiomegaly with possible increase. Atherosclerosis of the thoracic aorta. Again seen vascular congestion, increased. There is progressive peribronchial cuffing. Minimal fluid in the right minor fissure with possible small pleural effusions. Streaky right greater than left basilar opacity. No pneumothorax. Chronic change of the right shoulder. IMPRESSION: Progressive peribronchial  cuffing suggesting pulmonary edema. Increased vascular congestion with cardiomegaly and possible small pleural effusions. Findings suggest CHF. Electronically Signed   By: Jeb Levering M.D.   On: 08/12/2016 05:56   Dg Chest Port 1 View  Result Date: 08/08/2016 CLINICAL DATA:  Initial evaluation for acute cough. History of COPD, CHF, asthma, PE. EXAM: PORTABLE CHEST 1 VIEW COMPARISON:  Prior radiograph from 09/07/2014. FINDINGS: Mild cardiomegaly, stable from previous. Mediastinal silhouette within normal limits. Lungs hypoinflated. Underlying changes related COPD. Mild diffuse pulmonary vascular congestion without frank pulmonary edema. No pleural effusion. No focal infiltrates. No pneumothorax. No acute osseous abnormality. Degenerative changes noted about the shoulders bilaterally. IMPRESSION: 1. Mild cardiomegaly with perihilar vascular congestion without overt pulmonary edema. 2. Underlying COPD. 3. No other active cardiopulmonary disease. Electronically Signed   By: Jeannine Boga  M.D.   On: 08/08/2016 21:28   Dg Foot 2 Views Right  Result Date: 08/06/2016 CLINICAL DATA:  Acute onset of right foot swelling and erythema. Right foot pain. Tack removed from plantar surface of foot 2 days ago. Initial encounter. EXAM: RIGHT FOOT - 2 VIEW COMPARISON:  None. FINDINGS: There is no evidence of fracture or dislocation. No definite osseous erosion is seen, though evaluation for osteomyelitis is limited on radiograph. The joint spaces are preserved. There is no evidence of talar subluxation; the subtalar joint is unremarkable in appearance. Diffuse soft tissue swelling is noted about the forefoot and midfoot. Prominent soft tissue air is seen tracking about the plantar aspect of the forefoot, between the second and third metatarsophalangeal joints. IMPRESSION: 1. No evidence fracture or dislocation. 2. No definite osseous erosion seen, though evaluation for osteomyelitis is limited on radiograph,. If there is significant clinical concern for osteomyelitis, MRI of the right foot could be considered for further evaluation. 3. Prominent soft tissue air tracking about the plantar aspect of the forefoot, between the second and third metatarsophalangeal joints. This raises concern for infection with a gas producing organism. Underlying necrotizing fasciitis cannot be excluded. These results were called by telephone at the time of interpretation on 08/06/2016 at 10:48 pm to Dr. Noemi Chapel, who verbally acknowledged these results. Electronically Signed   By: Garald Balding M.D.   On: 08/06/2016 22:48       Subjective: Feeling well.    Discharge Exam: Vitals:   08/13/16 2129 08/14/16 0539  BP: (!) 157/67 (!) 168/60  Pulse: 85 77  Resp: 18 16  Temp: 98 F (36.7 C) 98.7 F (37.1 C)   Vitals:   08/13/16 2129 08/14/16 0419 08/14/16 0539 08/14/16 0752  BP: (!) 157/67  (!) 168/60   Pulse: 85  77   Resp: 18  16   Temp: 98 F (36.7 C)  98.7 F (37.1 C)   TempSrc: Oral  Oral   SpO2: 96%   96% 97%  Weight:  94.7 kg (208 lb 12.4 oz)    Height:        General: Pt is alert, awake, not in acute distress Cardiovascular: RRR, S1/S2 +, no rubs, no gallops Respiratory: CTA bilaterally, no wheezing, no rhonchi Abdominal: Soft, NT, ND, bowel sounds + Extremities: no edema, no cyanosis    The results of significant diagnostics from this hospitalization (including imaging, microbiology, ancillary and laboratory) are listed below for reference.     Microbiology: Recent Results (from the past 240 hour(s))  MRSA PCR Screening     Status: None   Collection Time: 08/07/16 12:45 AM  Result Value Ref Range Status  MRSA by PCR NEGATIVE NEGATIVE Final    Comment:        The GeneXpert MRSA Assay (FDA approved for NASAL specimens only), is one component of a comprehensive MRSA colonization surveillance program. It is not intended to diagnose MRSA infection nor to guide or monitor treatment for MRSA infections.   Aerobic/Anaerobic Culture (surgical/deep wound)     Status: None   Collection Time: 08/09/16  8:37 AM  Result Value Ref Range Status   Specimen Description ABSCESS  Final   Special Requests NONE  Final   Gram Stain   Final    MODERATE WBC PRESENT,BOTH PMN AND MONONUCLEAR MODERATE GRAM POSITIVE COCCI IN PAIRS MODERATE GRAM VARIABLE ROD    Culture   Final    NORMAL SKIN FLORA ABUNDANT CLOSTRIDIUM PERFRINGENS    Report Status 08/13/2016 FINAL  Final     Labs: BNP (last 3 results)  Recent Labs  08/12/16 0623  BNP 413.2*   Basic Metabolic Panel:  Recent Labs Lab 08/08/16 0452 08/10/16 0446 08/11/16 0519 08/12/16 0623 08/13/16 0706  NA 136 138 138 140 138  K 3.7 3.8 4.1 3.5 3.6  CL 103 102 101 98* 93*  CO2 24 29 29  33* 33*  GLUCOSE 188* 94 210* 150* 280*  BUN 9 6 8 9 16   CREATININE 0.58 0.59 0.60 0.61 0.68  CALCIUM 8.3* 8.5* 8.6* 8.7* 8.7*   Liver Function Tests: CBC:  Recent Labs Lab 08/09/16 0446 08/10/16 0446 08/11/16 0519  08/12/16 0623 08/13/16 0706  WBC 13.9* 11.6* 8.2 10.3 10.2  NEUTROABS  --   --   --  7.9*  --   HGB 11.2* 11.6* 11.5* 12.2 13.2  HCT 33.3* 35.1* 35.3* 37.1 39.9  MCV 86.9 88.6 89.1 88.8 87.3  PLT 194 223 225 238 226   Cardiac Enzymes:  Recent Labs Lab 08/12/16 0623  TROPONINI <0.03   CBG:  Recent Labs Lab 08/13/16 0725 08/13/16 1145 08/13/16 1614 08/13/16 2127 08/14/16 0743  GLUCAP 274* 287* 199* 305* 234*   Microbiology Recent Results (from the past 240 hour(s))  MRSA PCR Screening     Status: None   Collection Time: 08/07/16 12:45 AM  Result Value Ref Range Status   MRSA by PCR NEGATIVE NEGATIVE Final    Comment:        The GeneXpert MRSA Assay (FDA approved for NASAL specimens only), is one component of a comprehensive MRSA colonization surveillance program. It is not intended to diagnose MRSA infection nor to guide or monitor treatment for MRSA infections.   Aerobic/Anaerobic Culture (surgical/deep wound)     Status: None   Collection Time: 08/09/16  8:37 AM  Result Value Ref Range Status   Specimen Description ABSCESS  Final   Special Requests NONE  Final   Gram Stain   Final    MODERATE WBC PRESENT,BOTH PMN AND MONONUCLEAR MODERATE GRAM POSITIVE COCCI IN PAIRS MODERATE GRAM VARIABLE ROD    Culture   Final    NORMAL SKIN FLORA ABUNDANT CLOSTRIDIUM PERFRINGENS    Report Status 08/13/2016 FINAL  Final    Time coordinating discharge: Over 30 minutes SIGNED:  Orvan Falconer, MD FACP [facility] 08/14/2016, 10:50 AM   If 7PM-7AM, please contact night-coverage [email or web address] Password [email or web address]

## 2016-08-23 ENCOUNTER — Encounter (HOSPITAL_COMMUNITY): Payer: Self-pay | Admitting: Emergency Medicine

## 2016-08-23 ENCOUNTER — Emergency Department (HOSPITAL_COMMUNITY): Payer: Medicare HMO

## 2016-08-23 ENCOUNTER — Inpatient Hospital Stay (HOSPITAL_COMMUNITY)
Admission: EM | Admit: 2016-08-23 | Discharge: 2016-08-26 | DRG: 638 | Disposition: A | Discharge status: Home / Self Care | Payer: Medicare HMO | Attending: Internal Medicine | Admitting: Internal Medicine

## 2016-08-23 DIAGNOSIS — F17210 Nicotine dependence, cigarettes, uncomplicated: Secondary | ICD-10-CM | POA: Diagnosis present

## 2016-08-23 DIAGNOSIS — I739 Peripheral vascular disease, unspecified: Secondary | ICD-10-CM | POA: Diagnosis not present

## 2016-08-23 DIAGNOSIS — E1151 Type 2 diabetes mellitus with diabetic peripheral angiopathy without gangrene: Secondary | ICD-10-CM | POA: Diagnosis present

## 2016-08-23 DIAGNOSIS — Z9981 Dependence on supplemental oxygen: Secondary | ICD-10-CM

## 2016-08-23 DIAGNOSIS — Z7982 Long term (current) use of aspirin: Secondary | ICD-10-CM

## 2016-08-23 DIAGNOSIS — Z85820 Personal history of malignant melanoma of skin: Secondary | ICD-10-CM

## 2016-08-23 DIAGNOSIS — Z794 Long term (current) use of insulin: Secondary | ICD-10-CM

## 2016-08-23 DIAGNOSIS — E119 Type 2 diabetes mellitus without complications: Secondary | ICD-10-CM

## 2016-08-23 DIAGNOSIS — Z7984 Long term (current) use of oral hypoglycemic drugs: Secondary | ICD-10-CM | POA: Diagnosis not present

## 2016-08-23 DIAGNOSIS — Z720 Tobacco use: Secondary | ICD-10-CM | POA: Diagnosis present

## 2016-08-23 DIAGNOSIS — J449 Chronic obstructive pulmonary disease, unspecified: Secondary | ICD-10-CM | POA: Diagnosis present

## 2016-08-23 DIAGNOSIS — M79671 Pain in right foot: Secondary | ICD-10-CM

## 2016-08-23 DIAGNOSIS — K219 Gastro-esophageal reflux disease without esophagitis: Secondary | ICD-10-CM | POA: Diagnosis present

## 2016-08-23 DIAGNOSIS — Z79899 Other long term (current) drug therapy: Secondary | ICD-10-CM

## 2016-08-23 DIAGNOSIS — L0390 Cellulitis, unspecified: Secondary | ICD-10-CM | POA: Diagnosis present

## 2016-08-23 DIAGNOSIS — L02619 Cutaneous abscess of unspecified foot: Secondary | ICD-10-CM | POA: Diagnosis present

## 2016-08-23 DIAGNOSIS — I5032 Chronic diastolic (congestive) heart failure: Secondary | ICD-10-CM | POA: Diagnosis present

## 2016-08-23 DIAGNOSIS — L03119 Cellulitis of unspecified part of limb: Secondary | ICD-10-CM | POA: Diagnosis not present

## 2016-08-23 DIAGNOSIS — F329 Major depressive disorder, single episode, unspecified: Secondary | ICD-10-CM | POA: Diagnosis present

## 2016-08-23 DIAGNOSIS — E11628 Type 2 diabetes mellitus with other skin complications: Secondary | ICD-10-CM | POA: Diagnosis present

## 2016-08-23 DIAGNOSIS — Z86711 Personal history of pulmonary embolism: Secondary | ICD-10-CM

## 2016-08-23 DIAGNOSIS — L03115 Cellulitis of right lower limb: Secondary | ICD-10-CM | POA: Diagnosis present

## 2016-08-23 DIAGNOSIS — E118 Type 2 diabetes mellitus with unspecified complications: Secondary | ICD-10-CM | POA: Diagnosis not present

## 2016-08-23 DIAGNOSIS — E1165 Type 2 diabetes mellitus with hyperglycemia: Secondary | ICD-10-CM | POA: Diagnosis present

## 2016-08-23 LAB — LACTIC ACID, PLASMA: LACTIC ACID, VENOUS: 2.4 mmol/L — AB (ref 0.5–1.9)

## 2016-08-23 LAB — COMPREHENSIVE METABOLIC PANEL
ALK PHOS: 68 U/L (ref 38–126)
ALT: 38 U/L (ref 14–54)
ANION GAP: 11 (ref 5–15)
AST: 20 U/L (ref 15–41)
Albumin: 3.3 g/dL — ABNORMAL LOW (ref 3.5–5.0)
BILIRUBIN TOTAL: 0.7 mg/dL (ref 0.3–1.2)
BUN: 13 mg/dL (ref 6–20)
CALCIUM: 9 mg/dL (ref 8.9–10.3)
CO2: 26 mmol/L (ref 22–32)
CREATININE: 0.78 mg/dL (ref 0.44–1.00)
Chloride: 95 mmol/L — ABNORMAL LOW (ref 101–111)
Glucose, Bld: 357 mg/dL — ABNORMAL HIGH (ref 65–99)
Potassium: 4.6 mmol/L (ref 3.5–5.1)
SODIUM: 132 mmol/L — AB (ref 135–145)
TOTAL PROTEIN: 6.4 g/dL — AB (ref 6.5–8.1)

## 2016-08-23 LAB — CBC WITH DIFFERENTIAL/PLATELET
Basophils Absolute: 0 10*3/uL (ref 0.0–0.1)
Basophils Relative: 0 %
EOS ABS: 0.3 10*3/uL (ref 0.0–0.7)
Eosinophils Relative: 2 %
HEMATOCRIT: 41.6 % (ref 36.0–46.0)
HEMOGLOBIN: 14.1 g/dL (ref 12.0–15.0)
LYMPHS ABS: 2.9 10*3/uL (ref 0.7–4.0)
LYMPHS PCT: 22 %
MCH: 29.3 pg (ref 26.0–34.0)
MCHC: 33.9 g/dL (ref 30.0–36.0)
MCV: 86.5 fL (ref 78.0–100.0)
MONOS PCT: 7 %
Monocytes Absolute: 0.9 10*3/uL (ref 0.1–1.0)
NEUTROS PCT: 69 %
Neutro Abs: 9 10*3/uL — ABNORMAL HIGH (ref 1.7–7.7)
Platelets: 261 10*3/uL (ref 150–400)
RBC: 4.81 MIL/uL (ref 3.87–5.11)
RDW: 13.2 % (ref 11.5–15.5)
WBC: 13.1 10*3/uL — ABNORMAL HIGH (ref 4.0–10.5)

## 2016-08-23 LAB — SEDIMENTATION RATE: Sed Rate: 30 mm/hr — ABNORMAL HIGH (ref 0–22)

## 2016-08-23 LAB — GLUCOSE, CAPILLARY: GLUCOSE-CAPILLARY: 323 mg/dL — AB (ref 65–99)

## 2016-08-23 LAB — C-REACTIVE PROTEIN: CRP: 3.4 mg/dL — AB (ref <1.0)

## 2016-08-23 MED ORDER — LOPERAMIDE HCL 2 MG PO CAPS: 2.0000 mg | ORAL_CAPSULE | Freq: Four times a day (QID) | ORAL | Status: DC | PRN

## 2016-08-23 MED ORDER — HYDROCODONE-ACETAMINOPHEN 5-325 MG PO TABS
1.0000 | ORAL_TABLET | Freq: Four times a day (QID) | ORAL | Status: DC | PRN
  Administered 2016-08-23: 2 via ORAL
  Filled 2016-08-23: qty 2

## 2016-08-23 MED ORDER — SODIUM CHLORIDE 0.9% FLUSH: 3.0000 mL | INTRAVENOUS | Status: DC | PRN

## 2016-08-23 MED ORDER — ALPRAZOLAM 0.5 MG PO TABS: 0.5000 mg | ORAL_TABLET | Freq: Three times a day (TID) | ORAL | Status: DC | PRN

## 2016-08-23 MED ORDER — INSULIN GLARGINE 100 UNIT/ML ~~LOC~~ SOLN: 10.0000 [IU] | Freq: Every day | SUBCUTANEOUS | Status: DC

## 2016-08-23 MED ORDER — MECLIZINE HCL 12.5 MG PO TABS: 12.5000 mg | ORAL_TABLET | Freq: Three times a day (TID) | ORAL | Status: DC | PRN

## 2016-08-23 MED ORDER — ACETAMINOPHEN 325 MG PO TABS
650.0000 mg | ORAL_TABLET | Freq: Four times a day (QID) | ORAL | Status: DC | PRN
  Filled 2016-08-23: qty 2

## 2016-08-23 MED ORDER — POLYETHYLENE GLYCOL 3350 17 G PO PACK: 17.0000 g | PACK | Freq: Every day | ORAL | Status: DC | PRN

## 2016-08-23 MED ORDER — CLINDAMYCIN PHOSPHATE 600 MG/50ML IV SOLN
600.0000 mg | Freq: Once | INTRAVENOUS | Status: AC
  Administered 2016-08-23: 600 mg via INTRAVENOUS
  Filled 2016-08-23: qty 50

## 2016-08-23 MED ORDER — KETOCONAZOLE 2 % EX CREA
1.0000 "application " | TOPICAL_CREAM | Freq: Two times a day (BID) | CUTANEOUS | Status: DC
  Filled 2016-08-23: qty 15

## 2016-08-23 MED ORDER — INSULIN ASPART 100 UNIT/ML ~~LOC~~ SOLN
0.0000 [IU] | Freq: Three times a day (TID) | SUBCUTANEOUS | Status: DC
  Administered 2016-08-24: 5 [IU] via SUBCUTANEOUS
  Administered 2016-08-24: 2 [IU] via SUBCUTANEOUS
  Administered 2016-08-24: 3 [IU] via SUBCUTANEOUS
  Administered 2016-08-25: 5 [IU] via SUBCUTANEOUS

## 2016-08-23 MED ORDER — POTASSIUM CHLORIDE CRYS ER 10 MEQ PO TBCR
10.0000 meq | EXTENDED_RELEASE_TABLET | Freq: Every day | ORAL | Status: DC
  Administered 2016-08-24 – 2016-08-26 (×3): 10 meq via ORAL
  Filled 2016-08-23 (×4): qty 1

## 2016-08-23 MED ORDER — ALBUTEROL SULFATE HFA 108 (90 BASE) MCG/ACT IN AERS: 1.0000 | INHALATION_SPRAY | Freq: Four times a day (QID) | RESPIRATORY_TRACT | Status: DC | PRN

## 2016-08-23 MED ORDER — DULOXETINE HCL 60 MG PO CPEP
60.0000 mg | ORAL_CAPSULE | Freq: Every day | ORAL | Status: DC
  Administered 2016-08-24 – 2016-08-26 (×3): 60 mg via ORAL
  Filled 2016-08-23 (×3): qty 1

## 2016-08-23 MED ORDER — ASPIRIN EC 81 MG PO TBEC
81.0000 mg | DELAYED_RELEASE_TABLET | Freq: Every day | ORAL | Status: DC
  Administered 2016-08-24 – 2016-08-26 (×3): 81 mg via ORAL
  Filled 2016-08-23 (×3): qty 1

## 2016-08-23 MED ORDER — HEPARIN SODIUM (PORCINE) 5000 UNIT/ML IJ SOLN: 5000.0000 [IU] | Freq: Three times a day (TID) | INTRAMUSCULAR | Status: DC

## 2016-08-23 MED ORDER — PANTOPRAZOLE SODIUM 40 MG PO TBEC
40.0000 mg | DELAYED_RELEASE_TABLET | Freq: Every day | ORAL | Status: DC
  Administered 2016-08-24 – 2016-08-26 (×3): 40 mg via ORAL
  Filled 2016-08-23 (×3): qty 1

## 2016-08-23 MED ORDER — ROSUVASTATIN CALCIUM 20 MG PO TABS
40.0000 mg | ORAL_TABLET | Freq: Every day | ORAL | Status: DC
  Administered 2016-08-23 – 2016-08-25 (×3): 40 mg via ORAL
  Filled 2016-08-23 (×3): qty 2

## 2016-08-23 MED ORDER — LINACLOTIDE 145 MCG PO CAPS: 145.0000 ug | ORAL_CAPSULE | Freq: Every day | ORAL | Status: DC | PRN

## 2016-08-23 MED ORDER — CLINDAMYCIN PHOSPHATE 300 MG/50ML IV SOLN
300.0000 mg | Freq: Four times a day (QID) | INTRAVENOUS | Status: DC
  Administered 2016-08-23 – 2016-08-24 (×2): 300 mg via INTRAVENOUS
  Filled 2016-08-23 (×8): qty 50

## 2016-08-23 MED ORDER — SODIUM CHLORIDE 0.9 % IV BOLUS (SEPSIS)
2000.0000 mL | Freq: Once | INTRAVENOUS | Status: AC
  Administered 2016-08-23: 2000 mL via INTRAVENOUS

## 2016-08-23 MED ORDER — ALBUTEROL SULFATE (2.5 MG/3ML) 0.083% IN NEBU: 2.5000 mg | INHALATION_SOLUTION | RESPIRATORY_TRACT | Status: DC | PRN

## 2016-08-23 MED ORDER — SENNA 8.6 MG PO TABS
1.0000 | ORAL_TABLET | Freq: Two times a day (BID) | ORAL | Status: DC
  Administered 2016-08-23 – 2016-08-26 (×6): 8.6 mg via ORAL
  Filled 2016-08-23 (×6): qty 1

## 2016-08-23 MED ORDER — ONDANSETRON HCL 4 MG PO TABS: 4.0000 mg | ORAL_TABLET | Freq: Four times a day (QID) | ORAL | Status: DC | PRN

## 2016-08-23 MED ORDER — SODIUM CHLORIDE 0.9% FLUSH
3.0000 mL | Freq: Two times a day (BID) | INTRAVENOUS | Status: DC
  Administered 2016-08-23: 3 mL via INTRAVENOUS

## 2016-08-23 MED ORDER — ACETAMINOPHEN 650 MG RE SUPP: 650.0000 mg | Freq: Four times a day (QID) | RECTAL | Status: DC | PRN

## 2016-08-23 MED ORDER — MIDODRINE HCL 5 MG PO TABS
5.0000 mg | ORAL_TABLET | Freq: Three times a day (TID) | ORAL | Status: DC
  Administered 2016-08-24 – 2016-08-26 (×7): 5 mg via ORAL
  Filled 2016-08-23 (×7): qty 1

## 2016-08-23 MED ORDER — CLOPIDOGREL BISULFATE 75 MG PO TABS: 75.0000 mg | ORAL_TABLET | Freq: Every day | ORAL | Status: DC

## 2016-08-23 MED ORDER — SODIUM CHLORIDE 0.9 % IV SOLN: 250.0000 mL | INTRAVENOUS | Status: DC | PRN

## 2016-08-23 MED ORDER — ONDANSETRON HCL 4 MG/2ML IJ SOLN: 4.0000 mg | Freq: Four times a day (QID) | INTRAMUSCULAR | Status: DC | PRN

## 2016-08-23 MED ORDER — FLUTICASONE PROPIONATE 50 MCG/ACT NA SUSP
2.0000 | Freq: Every day | NASAL | Status: DC
  Administered 2016-08-24 – 2016-08-26 (×3): 2 via NASAL
  Filled 2016-08-23: qty 16

## 2016-08-23 MED ORDER — MORPHINE SULFATE (PF) 2 MG/ML IV SOLN: 2.0000 mg | INTRAVENOUS | Status: DC | PRN

## 2016-08-23 MED ORDER — FLUTICASONE FUROATE-VILANTEROL 200-25 MCG/INH IN AEPB: 1.0000 | INHALATION_SPRAY | Freq: Every day | RESPIRATORY_TRACT | Status: DC

## 2016-08-23 MED ORDER — MOMETASONE FURO-FORMOTEROL FUM 200-5 MCG/ACT IN AERO
2.0000 | INHALATION_SPRAY | Freq: Two times a day (BID) | RESPIRATORY_TRACT | Status: DC
  Administered 2016-08-24 – 2016-08-26 (×5): 2 via RESPIRATORY_TRACT
  Filled 2016-08-23: qty 8.8

## 2016-08-23 MED ORDER — METFORMIN HCL 500 MG PO TABS
500.0000 mg | ORAL_TABLET | Freq: Two times a day (BID) | ORAL | Status: DC
Start: 2016-08-24 — End: 2016-08-23

## 2016-08-23 MED ORDER — HEPARIN SODIUM (PORCINE) 5000 UNIT/ML IJ SOLN
5000.0000 [IU] | Freq: Three times a day (TID) | INTRAMUSCULAR | Status: DC
  Administered 2016-08-25 – 2016-08-26 (×4): 5000 [IU] via SUBCUTANEOUS
  Filled 2016-08-23 (×5): qty 1

## 2016-08-23 MED ORDER — TRAZODONE HCL 50 MG PO TABS: 50.0000 mg | ORAL_TABLET | Freq: Every evening | ORAL | Status: DC | PRN

## 2016-08-23 NOTE — ED Triage Notes (Addendum)
Pt reports had several toes removed from right foot on friday and is currently being treated for cellulitis in right foot. Pt reports home health nurse assessed right foot this am and reported "infection was getting worse." pt also reports has lost approximately 15 pounds since last Friday.

## 2016-08-23 NOTE — ED Notes (Signed)
Date and time results received: 08/23/16 1915 (use smartphrase ".now" to insert current time)  Test: lactic Acid Critical Value: 2.54mmol/L  Name of Provider Notified: DR. Dayna Barker  Orders Received? Or Actions Taken?: Dr. Ree Kida.

## 2016-08-23 NOTE — H&P (Signed)
Patient Demographics:    Jacqueline Hansen, is a 76 y.o. female  MRN: [id number]   DOB - 12-18-1940  Admit Date - 08/23/2016  Outpatient Primary MD for the patient is Jacqueline Police, MD   Assessment & Plan:    Principal Problem:   Cellulitis in diabetic foot [facility]) Active Problems:   DM (diabetes mellitus) ([place])   PAD (peripheral artery disease) ([place])   Cellulitis   Cellulitis and abscess of foot   COPD with chronic bronchitis ([place])   Tobacco abuse    1)Rt Foot Cellulitis-which appears to be worse despite being on Cipro,  status post I&D on 08/09/2016 by Jacqueline Hansen. Wound culture grew normal skin flora and Clostridium perfringens. Treat empirically with IV clindamycin pending further evaluation from Jacqueline Hansen. Leukocytosis may be partly secondary to recent steroid use, IV fluid boluses given in the ED secondary to elevated lactic acid levels. ED physician discussed this case already with on-call surgeon Jacqueline Hansen, possible I&D versus amputation on 08/24/2016. Patient will be kept nothing by mouth after midnight. Her ESR is 30 down from 61 about 2 weeks ago. CRP is 3.4 , down from 26.9 a couple of weeks ago  2)DM- recent AIC is 9.2, patient will be nothing by mouth overnight to allow for possible surgery in a.m. Allow some permissive Hyperglycemia rather than risk life-threatening hypoglycemia in a patient with unreliable oral intake. Hold Lantus and Metformin and Use Novolog/Humalog Sliding scale insulin with Accu-Cheks/Fingersticks as ordered  3)PAD- smoking cessation strongly advised, continue aspirin but hold Plavix to allow for surgery, continue Crestor  4)COPD- no acute exacerbation at this time, continue bronchodilators and supplemental oxygen. Patient is not ready to quit smoking  With History of  - Reviewed by me  Past Medical History:  Diagnosis Date  . Anxiety   . Arthritis    "joints" (09/08/2014)  . Asthma   . CHF (congestive heart failure) ([place])   . Chronic bronchitis ([place])    "get it q yr"  . COPD (chronic obstructive pulmonary disease) ([place])   . Depression   . Fibromyalgia   . Gastric ulcer   . GERD (gastroesophageal reflux disease)   . 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 History of blood transfusion ?2015   "cause I was throwing it up"  . History of hiatal hernia   . Hypercholesterolemia   . 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 On home oxygen therapy    "2L ususally at night time" (09/08/2014)  . Pulmonary embolism ([place])    "when I had my gallbladder taken out"  . Type II diabetes mellitus ([place])       Past Surgical History:  Procedure Laterality Date  . ABDOMINAL HYSTERECTOMY    . CARPAL TUNNEL RELEASE Bilateral   . CATARACT EXTRACTION W/ INTRAOCULAR LENS  IMPLANT, BILATERAL Bilateral   . COLONOSCOPY N/A 09/09/2014   Procedure: COLONOSCOPY;  Surgeon: Jacqueline Shipper, MD;  Location: [facility] ENDOSCOPY;  Service: Endoscopy;  Laterality: N/A;  . EXCISIONAL HEMORRHOIDECTOMY    . INCISION AND DRAINAGE ABSCESS Right 08/09/2016   Procedure: INCISION AND DRAINAGE ABSCESS;  Surgeon: Jacqueline Signs, MD;  Location: [facility] ORS;  Service: General;  Laterality: Right;  . LAPAROSCOPIC CHOLECYSTECTOMY    . MELANOMA EXCISION     "off my nose"  . TUBAL LIGATION        Chief Complaint  Patient presents with  . Foot Pain      HPI:    Jacqueline Hansen  is a 76 y.o. female, With past medical history relevant for poorly controlled diabetes, peripheral artery disease, tobacco use disorder with COPD, as well as history of diastolic dysfunction CHF who presents to the ED with concerns of worsening right lower extremity swelling, redness and pain. No fever  Or chills .  In late May 2018 patient apparently had Rt foot injury, was admitted on 08/06/2016 with cellulitis after  puncture wound injury, subsequently had I&D by Jacqueline Hansen on 08/09/2016. Wound culture grew normal skin flora and Clostridium perfringens, she was discharged home on Cipro on 08/11/2016.  Readmitted on 08/12/2016 with COPD and CHF flareup   She had been doing okay until a couple days ago when she noticed increased purulent drainage from the right foot wound associated with increased swelling, redness warmth and pain. No vomiting or diarrhea.   According to patient's son and daughter at bedside, her right foot looks much worse now.  No chest pains palpitations or dizziness  In ED... X-rays without evidence of osteomyelitis, sedimentation rate is actually lower than a recent sedimentation rate (Her ESR is 30 down from 61 about 2 weeks ago. CRP is 3.4 , down from 26.9 a couple of weeks ago),  leukocytosis noted however patient has been on steroids recently  Patient received IV fluids due to elevated lactic acid was in the ED         Review of systems:    In addition to the HPI above,   A full 12 point Review of 10 Systems was done, except as stated above, all other Review of 10 Systems were negative.    Social History:  Reviewed by me    Social History  Substance Use Topics  . Smoking status: Current Every Day Smoker    Packs/day: 0.50    Years: 32.00    Types: Cigarettes  . Smokeless tobacco: Never Used  . Alcohol use Yes     Comment: 09/08/2014 "I drank a little bit a long time ago"       Family History :  Reviewed by me   History reviewed. No pertinent family history.    Home Medications:   Prior to Admission medications   Medication Sig Start Date End Date Taking? Authorizing Provider  albuterol (PROVENTIL HFA;VENTOLIN HFA) 108 (90 Base) MCG/ACT inhaler Inhale 1-2 puffs into the lungs every 6 (six) hours as needed for wheezing or shortness of breath.   Yes [provider]  ALPRAZolam Duanne Moron) 0.5 MG tablet Take 0.5 mg by mouth 3 (three) times daily as  needed for anxiety.    Yes [provider]  aspirin EC 81 MG tablet Take 81 mg by mouth daily.   Yes [provider]  ciprofloxacin (CIPRO) 500 MG tablet Take 1 tablet (500 mg total) by mouth 2 (two) times daily. 08/11/16  Yes Orvan Falconer, MD  clopidogrel (PLAVIX) 75 MG tablet Take 75 mg by  mouth daily.   Yes [provider]  DULoxetine (CYMBALTA) 60 MG capsule Take 60 mg by mouth daily.    Yes [provider]  esomeprazole (NEXIUM) 40 MG capsule Take 40 mg by mouth daily at 12 noon.   Yes [provider]  fluticasone (FLONASE) 50 MCG/ACT nasal spray Place 2 sprays into both nostrils daily.   Yes [provider]  Fluticasone-Salmeterol (ADVAIR) 250-50 MCG/DOSE AEPB Inhale 1 puff into the lungs daily as needed.    Yes [provider]  furosemide (LASIX) 20 MG tablet Take 1 tablet (20 mg total) by mouth daily. 08/14/16 08/14/17 Yes Orvan Falconer, MD  HYDROcodone-acetaminophen (NORCO/VICODIN) 5-325 MG tablet Take 1-2 tablets by mouth every 6 (six) hours as needed for moderate pain.  08/19/16  Yes [provider]  ibuprofen (ADVIL,MOTRIN) 800 MG tablet Take 800 mg by mouth every 8 (eight) hours as needed for mild pain or moderate pain.   Yes [provider]  insulin glargine (LANTUS) 100 UNIT/ML injection Inject 0.2 mLs (20 Units total) into the skin at bedtime. 08/11/16  Yes Orvan Falconer, MD  ketoconazole (NIZORAL) 2 % cream Apply 1 application topically daily as needed for irritation (APPLIED TO FEET).  07/27/16  Yes [provider]  linaclotide (LINZESS) 145 MCG CAPS capsule Take 145 mcg by mouth daily as needed (for constipation).   Yes [provider]  loperamide (IMODIUM A-D) 2 MG tablet Take 2 mg by mouth 4 (four) times daily as needed for diarrhea or loose stools.   Yes [provider]  meclizine (ANTIVERT) 12.5 MG tablet Take 12.5 mg by mouth 3 (three) times daily as needed for dizziness.   Yes [provider]  metFORMIN (GLUCOPHAGE) 500 MG tablet Take 500 mg by mouth 2 (two) times daily with a meal.    Yes [provider]  midodrine (PROAMATINE) 5 MG tablet Take 5 mg by mouth 3 (three) times daily.   Yes [provider]  mometasone-formoterol (DULERA) 200-5 MCG/ACT AERO Inhale 2 puffs into the lungs 2 (two) times daily.   Yes [provider]  potassium chloride (K-DUR) 10 MEQ tablet Take 1 tablet (10 mEq total) by mouth daily. 08/14/16  Yes Orvan Falconer, MD  rosuvastatin (CRESTOR) 40 MG tablet Take 40 mg by mouth at bedtime.    Yes [provider]  predniSONE (STERAPRED UNI-PAK 21 TAB) 10 MG (21) TBPK tablet Use as directed. Patient not taking: Reported on 08/23/2016 08/14/16   Orvan Falconer, MD     Allergies:     Allergies  Allergen Reactions  . Adhesive [Tape] Other (See Comments)    Reaction:  Tears pts skin      Physical Exam:   Vitals  Blood pressure (!) 143/62, pulse 87, temperature 97.5 F (36.4 C), temperature source Oral, resp. rate 18, height _0  (1.575 m), weight 84.4 kg (186 lb), SpO2 99 %.  Physical Examination: General appearance - alert, well appearing, and in no distress  Mental status - alert, oriented to person, place, and time,  Eyes - sclera anicteric Neck - supple, no JVD elevation , Chest - clear  to auscultation bilaterally, symmetrical air movement,  Heart - S1 and S2 normal,  Abdomen - soft, nontender, nondistended, no masses or organomegaly Neurological - screening mental status exam normal, neck supple without rigidity, cranial nerves II through XII intact, DTR's normal and symmetric Extremities -  intact peripheral pulses, right foot especially over the dorsal surface with significant erythema, swelling,  and tenderness, there is an open wound over the second and third metatarsal area with purulent drainage. The ball of the foot also has significant swelling and erythema    Data Review:    CBC  Recent Labs Lab  08/23/16 1812  WBC 13.1*  HGB 14.1  HCT 41.6  PLT 261  MCV 86.5  MCH 29.3  MCHC 33.9  RDW 13.2  LYMPHSABS 2.9  MONOABS 0.9  EOSABS 0.3  BASOSABS 0.0   ------------------------------------------------------------------------------------------------------------------  Chemistries   Recent Labs Lab 08/23/16 1812  NA 132*  K 4.6  CL 95*  CO2 26  GLUCOSE 357*  BUN 13  CREATININE 0.78  CALCIUM 9.0  AST 20  ALT 38  ALKPHOS 68  BILITOT 0.7   ------------------------------------------------------------------------------------------------------------------ estimated creatinine clearance is 62.1 mL/min (by C-G formula based on SCr of 0.78 mg/dL). ------------------------------------------------------------------------------------------------------------------ No results for input(s): TSH, T4TOTAL, T3FREE, THYROIDAB in the last 72 hours.  Invalid input(s): FREET3   Coagulation profile No results for input(s): INR, PROTIME in the last 168 hours. ------------------------------------------------------------------------------------------------------------------- No results for input(s): DDIMER in the last 72 hours. -------------------------------------------------------------------------------------------------------------------  Cardiac Enzymes No results for input(s): CKMB, TROPONINI, MYOGLOBIN in the last 168 hours.  Invalid input(s): CK ------------------------------------------------------------------------------------------------------------------    Component Value Date/Time   BNP 449.0 (H) 08/12/2016 8250     ---------------------------------------------------------------------------------------------------------------  Urinalysis No results found for: COLORURINE, APPEARANCEUR, LABSPEC, PHURINE, GLUCOSEU, HGBUR, BILIRUBINUR, KETONESUR, PROTEINUR, UROBILINOGEN, NITRITE,  LEUKOCYTESUR  ----------------------------------------------------------------------------------------------------------------   Imaging Results:    Dg Foot Complete Right  Result Date: 08/23/2016 CLINICAL DATA:  76 y/o  F; foot infection. EXAM: RIGHT FOOT COMPLETE - 3+ VIEW COMPARISON:  None. FINDINGS: There is no evidence of fracture or dislocation. There is no evidence of arthropathy or other focal bone abnormality. Diffuse soft tissue swelling of the foot. IMPRESSION: Diffuse soft tissue swelling of the foot. No radiographic evidence for osteomyelitis. Electronically Signed   By: Kristine Garbe M.D.   On: 08/23/2016 18:57    Radiological Exams on Admission: Dg Foot Complete Right  Result Date: 08/23/2016 CLINICAL DATA:  76 y/o  F; foot infection. EXAM: RIGHT FOOT COMPLETE - 3+ VIEW COMPARISON:  None. FINDINGS: There is no evidence of fracture or dislocation. There is no evidence of arthropathy or other focal bone abnormality. Diffuse soft tissue swelling of the foot. IMPRESSION: Diffuse soft tissue swelling of the foot. No radiographic evidence for osteomyelitis. Electronically Signed   By: Kristine Garbe M.D.   On: 08/23/2016 18:57    DVT Prophylaxis -SCD  /heparin AM Labs Ordered, also please review Full Orders  Family Communication: Admission, patients condition and plan of care including tests being ordered have been discussed with the patient and son and daughter who indicate understanding and agree with the plan   Code Status - Full Code  Likely DC to  home  Condition   stable  Kyndal Heringer M.D on 08/23/2016 at 9:55 PM   Between 7am to 7pm - Pager - [phone]  After 7pm go to [email or web address] - password [email or web address]  [facility] - Office  [phone]  Voice Recognition Viviann Spare dictation system was used to create this note, attempts have been made to correct errors. Please contact the author with questions and/or clarifications.

## 2016-08-23 NOTE — ED Provider Notes (Signed)
[place] DEPT Provider Note   CSN: [id number] Arrival date & time: 08/23/16  1734     History   Chief Complaint Chief Complaint  Patient presents with  . Foot Pain    HPI Jacqueline Hansen is a 76 y.o. female.   Foot Pain  This is a new problem. The current episode started yesterday. The problem occurs constantly. The problem has been gradually worsening. Nothing aggravates the symptoms. Nothing relieves the symptoms. She has tried nothing for the symptoms.    Past Medical History:  Diagnosis Date  . Anxiety   . Arthritis    "joints" (09/08/2014)  . Asthma   . CHF (congestive heart failure) ([place])   . Chronic bronchitis ([place])    "get it q yr"  . COPD (chronic obstructive pulmonary disease) ([place])   . Depression   . Fibromyalgia   . Gastric ulcer   . GERD (gastroesophageal reflux disease)   . 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)    "last one was in the 1970's" (09/08/2014)  . History of blood transfusion ?2015   "cause I was throwing it up"  . History of hiatal hernia   . Hypercholesterolemia   . 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 On home oxygen therapy    "2L ususally at night time" (09/08/2014)  . Pulmonary embolism ([place])    "when I had my gallbladder taken out"  . Type II diabetes mellitus [facility])     Patient Active Problem List   Diagnosis Date Noted  . Cellulitis in diabetic foot ([place]) 08/23/2016  . COPD with chronic bronchitis ([place]) 08/23/2016  . Tobacco abuse 08/23/2016  . SOB (shortness of breath) 08/12/2016  . COPD with acute exacerbation ([place]) 08/12/2016  . Cellulitis and abscess of foot   . Cellulitis 08/06/2016  . Benign neoplasm of sigmoid colon   . Rectal ulceration   . GI bleed 09/07/2014  . Colitis 09/07/2014  . COPD (chronic obstructive pulmonary disease) ([place]) 09/07/2014  . DM (diabetes mellitus) ([place]) 09/07/2014  . PAD (peripheral artery disease) ([place]) 09/07/2014  . Urinary retention 09/07/2014    Past Surgical History:  Procedure Laterality Date  .  ABDOMINAL HYSTERECTOMY    . CARPAL TUNNEL RELEASE Bilateral   . CATARACT EXTRACTION W/ INTRAOCULAR LENS  IMPLANT, BILATERAL Bilateral   . COLONOSCOPY N/A 09/09/2014   Procedure: COLONOSCOPY;  Surgeon: Irene Shipper, MD;  Location: [place];  Service: Endoscopy;  Laterality: N/A;  . EXCISIONAL HEMORRHOIDECTOMY    . INCISION AND DRAINAGE ABSCESS Right 08/09/2016   Procedure: INCISION AND DRAINAGE ABSCESS;  Surgeon: Aviva Signs, MD;  Location: [facility];  Service: General;  Laterality: Right;  . LAPAROSCOPIC CHOLECYSTECTOMY    . MELANOMA EXCISION     "off my nose"  . TUBAL LIGATION      OB History    No data available       Home Medications    Prior to Admission medications   Medication Sig Start Date End Date Taking? Authorizing Provider  albuterol (PROVENTIL HFA;VENTOLIN HFA) 108 (90 Base) MCG/ACT inhaler Inhale 1-2 puffs into the lungs every 6 (six) hours as needed for wheezing or shortness of breath.   Yes [provider]  ALPRAZolam Duanne Moron) 0.5 MG tablet Take 0.5 mg by mouth 3 (three) times daily as needed for anxiety.    Yes [provider]  aspirin EC 81 MG tablet Take 81 mg by mouth daily.   Yes [provider]  ciprofloxacin (CIPRO) 500 MG tablet Take 1  tablet (500 mg total) by mouth 2 (two) times daily. 08/11/16  Yes Orvan Falconer, MD  clopidogrel (PLAVIX) 75 MG tablet Take 75 mg by mouth daily.   Yes [provider]  DULoxetine (CYMBALTA) 60 MG capsule Take 60 mg by mouth daily.    Yes [provider]  esomeprazole (NEXIUM) 40 MG capsule Take 40 mg by mouth daily at 12 noon.   Yes [provider]  fluticasone (FLONASE) 50 MCG/ACT nasal spray Place 2 sprays into both nostrils daily.   Yes [provider]  Fluticasone-Salmeterol (ADVAIR) 250-50 MCG/DOSE AEPB Inhale 1 puff into the lungs daily as needed.    Yes [provider]  furosemide (LASIX) 20 MG tablet Take 1 tablet (20 mg total) by mouth daily. 08/14/16  08/14/17 Yes Orvan Falconer, MD  HYDROcodone-acetaminophen (NORCO/VICODIN) 5-325 MG tablet Take 1-2 tablets by mouth every 6 (six) hours as needed for moderate pain.  08/19/16  Yes [provider]  ibuprofen (ADVIL,MOTRIN) 800 MG tablet Take 800 mg by mouth every 8 (eight) hours as needed for mild pain or moderate pain.   Yes [provider]  insulin glargine (LANTUS) 100 UNIT/ML injection Inject 0.2 mLs (20 Units total) into the skin at bedtime. 08/11/16  Yes Orvan Falconer, MD  ketoconazole (NIZORAL) 2 % cream Apply 1 application topically daily as needed for irritation (APPLIED TO FEET).  07/27/16  Yes [provider]  linaclotide (LINZESS) 145 MCG CAPS capsule Take 145 mcg by mouth daily as needed (for constipation).   Yes [provider]  loperamide (IMODIUM A-D) 2 MG tablet Take 2 mg by mouth 4 (four) times daily as needed for diarrhea or loose stools.   Yes [provider]  meclizine (ANTIVERT) 12.5 MG tablet Take 12.5 mg by mouth 3 (three) times daily as needed for dizziness.   Yes [provider]  metFORMIN (GLUCOPHAGE) 500 MG tablet Take 500 mg by mouth 2 (two) times daily with a meal.    Yes [provider]  midodrine (PROAMATINE) 5 MG tablet Take 5 mg by mouth 3 (three) times daily.   Yes [provider]  mometasone-formoterol (DULERA) 200-5 MCG/ACT AERO Inhale 2 puffs into the lungs 2 (two) times daily.   Yes [provider]  potassium chloride (K-DUR) 10 MEQ tablet Take 1 tablet (10 mEq total) by mouth daily. 08/14/16  Yes Orvan Falconer, MD  rosuvastatin (CRESTOR) 40 MG tablet Take 40 mg by mouth at bedtime.    Yes [provider]  predniSONE (STERAPRED UNI-PAK 21 TAB) 10 MG (21) TBPK tablet Use as directed. Patient not taking: Reported on 08/23/2016 08/14/16   Orvan Falconer, MD    Family History History reviewed. No pertinent family history.  Social History Social History  Substance Use Topics  . Smoking status:  Current Every Day Smoker    Packs/day: 0.50    Years: 32.00    Types: Cigarettes  . Smokeless tobacco: Never Used  . Alcohol use Yes     Comment: 09/08/2014 "I drank a little bit a long time ago"     Allergies   Adhesive [tape]   Review of Systems Review of Systems  All other systems reviewed and are negative.    Physical Exam Updated Vital Signs BP (!) 161/71 (BP Location: Left Arm)   Pulse 94   Temp 97.9 F (36.6 C) (Oral)   Resp 20   Ht 5\' 2"  (1.575 m)   Wt 88.8 kg (195 lb 12.8 oz)  SpO2 97%   BMI 35.81 kg/m   Physical Exam  Constitutional: She is oriented to person, place, and time. She appears well-developed and well-nourished.  HENT:  Head: Normocephalic and atraumatic.  Eyes: Conjunctivae and EOM are normal.  Neck: Normal range of motion.  Cardiovascular: Normal rate and regular rhythm.   Pulmonary/Chest: Effort normal. No stridor. No respiratory distress.  Abdominal: Soft. She exhibits no distension. There is no tenderness.  Neurological: She is alert and oriented to person, place, and time. No cranial nerve deficit. Coordination normal.  Skin: Skin is warm and dry. Rash (right foot dorsal surface with edema and ttp, warmth) noted.  Nursing note and vitals reviewed.    ED Treatments / Results  Labs (all labs ordered are listed, but only abnormal results are displayed) Labs Reviewed  CBC WITH DIFFERENTIAL/PLATELET - Abnormal; Notable for the following:       Result Value   WBC 13.1 (*)    Neutro Abs 9.0 (*)    All other components within normal limits  COMPREHENSIVE METABOLIC PANEL - Abnormal; Notable for the following:    Sodium 132 (*)    Chloride 95 (*)    Glucose, Bld 357 (*)    Total Protein 6.4 (*)    Albumin 3.3 (*)    All other components within normal limits  SEDIMENTATION RATE - Abnormal; Notable for the following:    Sed Rate 30 (*)    All other components within normal limits  C-REACTIVE PROTEIN - Abnormal; Notable for the  following:    CRP 3.4 (*)    All other components within normal limits  LACTIC ACID, PLASMA - Abnormal; Notable for the following:    Lactic Acid, Venous 2.4 (*)    All other components within normal limits  GLUCOSE, CAPILLARY - Abnormal; Notable for the following:    Glucose-Capillary 323 (*)    All other components within normal limits  CULTURE, BLOOD (ROUTINE X 2)  CULTURE, BLOOD (ROUTINE X 2)  BASIC METABOLIC PANEL  CBC    EKG  EKG Interpretation None       Radiology Dg Foot Complete Right  Result Date: 08/23/2016 CLINICAL DATA:  76 y/o  F; foot infection. EXAM: RIGHT FOOT COMPLETE - 3+ VIEW COMPARISON:  None. FINDINGS: There is no evidence of fracture or dislocation. There is no evidence of arthropathy or other focal bone abnormality. Diffuse soft tissue swelling of the foot. IMPRESSION: Diffuse soft tissue swelling of the foot. No radiographic evidence for osteomyelitis. Electronically Signed   By: Kristine Garbe M.D.   On: 08/23/2016 18:57    Procedures Procedures (including critical care time)  Medications Ordered in ED Medications  DULoxetine (CYMBALTA) DR capsule 60 mg (not administered)  ALPRAZolam (XANAX) tablet 0.5 mg (not administered)  midodrine (PROAMATINE) tablet 5 mg (not administered)  fluticasone (FLONASE) 50 MCG/ACT nasal spray 2 spray (not administered)  rosuvastatin (CRESTOR) tablet 40 mg (40 mg Oral Given 08/23/16 2311)  pantoprazole (PROTONIX) EC tablet 40 mg (not administered)  aspirin EC tablet 81 mg (not administered)  potassium chloride (K-DUR) CR tablet 10 mEq (not administered)  mometasone-formoterol (DULERA) 200-5 MCG/ACT inhaler 2 puff (2 puffs Inhalation Not Given 08/23/16 2328)  linaclotide (LINZESS) capsule 145 mcg (not administered)  meclizine (ANTIVERT) tablet 12.5 mg (not administered)  loperamide (IMODIUM) capsule 2 mg (not administered)  HYDROcodone-acetaminophen (NORCO/VICODIN) 5-325 MG per tablet 1-2 tablet (2 tablets  Oral Given 08/23/16 2145)  ketoconazole (NIZORAL) 2 % cream 1 application (not administered)  sodium  chloride flush (NS) 0.9 % injection 3 mL (3 mLs Intravenous Given 08/23/16 2311)  sodium chloride flush (NS) 0.9 % injection 3 mL (not administered)  0.9 %  sodium chloride infusion (not administered)  acetaminophen (TYLENOL) tablet 650 mg (650 mg Oral Not Given 08/23/16 2145)    Or  acetaminophen (TYLENOL) suppository 650 mg ( Rectal See Alternative 08/23/16 2145)  traZODone (DESYREL) tablet 50 mg (not administered)  senna (SENOKOT) tablet 8.6 mg (8.6 mg Oral Given 08/23/16 2312)  polyethylene glycol (MIRALAX / GLYCOLAX) packet 17 g (not administered)  ondansetron (ZOFRAN) tablet 4 mg (not administered)    Or  ondansetron (ZOFRAN) injection 4 mg (not administered)  albuterol (PROVENTIL) (2.5 MG/3ML) 0.083% nebulizer solution 2.5 mg (not administered)  clindamycin (CLEOCIN) IVPB 300 mg (0 mg Intravenous Stopped 08/23/16 2208)  insulin aspart (novoLOG) injection 0-9 Units (not administered)  heparin injection 5,000 Units (not administered)  morphine 2 MG/ML injection 2 mg (not administered)  clindamycin (CLEOCIN) IVPB 600 mg (0 mg Intravenous Stopped 08/23/16 1859)  sodium chloride 0.9 % bolus 2,000 mL (0 mLs Intravenous Stopped 08/23/16 2215)     Initial Impression / Assessment and Plan / ED Course  I have reviewed the triage vital signs and the nursing notes.  Pertinent labs & imaging results that were available during my care of the patient were reviewed by me and considered in my medical decision making (see chart for details).      Suspect cellulitis, still on outpatient antibiotics. Will likely need admission for iv abx 2/2 failed outpatient.   D/w Dr. Arnoldo Morale, surgery, who will see in AM for consultation but recommends admission for IV abx and is ok with clindamycin.   D/w hospitalist, who agrees to admit.   Final Clinical Impressions(s) / ED Diagnoses   Final diagnoses:  Foot  pain, right  Cellulitis of right lower extremity      Stephanine Reas, Corene Cornea, MD 08/23/16 2354

## 2016-08-24 DIAGNOSIS — I739 Peripheral vascular disease, unspecified: Secondary | ICD-10-CM

## 2016-08-24 DIAGNOSIS — Z794 Long term (current) use of insulin: Secondary | ICD-10-CM

## 2016-08-24 DIAGNOSIS — J449 Chronic obstructive pulmonary disease, unspecified: Secondary | ICD-10-CM

## 2016-08-24 DIAGNOSIS — E118 Type 2 diabetes mellitus with unspecified complications: Secondary | ICD-10-CM

## 2016-08-24 LAB — CBC
HCT: 37 % (ref 36.0–46.0)
HEMOGLOBIN: 12.4 g/dL (ref 12.0–15.0)
MCH: 29.2 pg (ref 26.0–34.0)
MCHC: 33.5 g/dL (ref 30.0–36.0)
MCV: 87.1 fL (ref 78.0–100.0)
PLATELETS: 221 10*3/uL (ref 150–400)
RBC: 4.25 MIL/uL (ref 3.87–5.11)
RDW: 12.9 % (ref 11.5–15.5)
WBC: 10.4 10*3/uL (ref 4.0–10.5)

## 2016-08-24 LAB — BASIC METABOLIC PANEL
ANION GAP: 7 (ref 5–15)
BUN: 10 mg/dL (ref 6–20)
CALCIUM: 8.3 mg/dL — AB (ref 8.9–10.3)
CO2: 26 mmol/L (ref 22–32)
Chloride: 101 mmol/L (ref 101–111)
Creatinine, Ser: 0.6 mg/dL (ref 0.44–1.00)
GFR calc Af Amer: >60 mL/min (ref >60)
Glucose, Bld: 327 mg/dL — ABNORMAL HIGH (ref 65–99)
Potassium: 4.5 mmol/L (ref 3.5–5.1)
SODIUM: 134 mmol/L — AB (ref 135–145)

## 2016-08-24 LAB — GLUCOSE, CAPILLARY
GLUCOSE-CAPILLARY: 264 mg/dL — AB (ref 65–99)
GLUCOSE-CAPILLARY: 193 mg/dL — AB (ref 65–99)
GLUCOSE-CAPILLARY: 328 mg/dL — AB (ref 65–99)
Glucose-Capillary: 231 mg/dL — ABNORMAL HIGH (ref 65–99)

## 2016-08-24 MED ORDER — PENICILLIN G POTASSIUM 5000000 UNITS IJ SOLR
4.0000 10*6.[IU] | INTRAVENOUS | Status: DC
  Administered 2016-08-24 – 2016-08-26 (×10): 4 10*6.[IU] via INTRAVENOUS
  Filled 2016-08-24 (×31): qty 4

## 2016-08-24 MED ORDER — CLINDAMYCIN PHOSPHATE 900 MG/50ML IV SOLN
900.0000 mg | Freq: Three times a day (TID) | INTRAVENOUS | Status: DC
  Administered 2016-08-24 – 2016-08-26 (×6): 900 mg via INTRAVENOUS
  Filled 2016-08-24 (×17): qty 50

## 2016-08-24 MED ORDER — HYDROCODONE-ACETAMINOPHEN 5-325 MG PO TABS
1.0000 | ORAL_TABLET | ORAL | Status: DC | PRN
  Administered 2016-08-24 – 2016-08-25 (×3): 1 via ORAL
  Filled 2016-08-24 (×3): qty 1

## 2016-08-24 MED ORDER — PENICILLIN G POTASSIUM 5000000 UNITS IJ SOLR
4.0000 10*6.[IU] | INTRAMUSCULAR | Status: DC
Start: 2016-08-24 — End: 2016-08-24
  Filled 2016-08-24 (×13): qty 5

## 2016-08-24 MED ORDER — INSULIN GLARGINE 100 UNIT/ML ~~LOC~~ SOLN
20.0000 [IU] | Freq: Every day | SUBCUTANEOUS | Status: DC
  Administered 2016-08-24 – 2016-08-26 (×3): 20 [IU] via SUBCUTANEOUS
  Filled 2016-08-24 (×6): qty 0.2

## 2016-08-24 MED ORDER — MORPHINE SULFATE (PF) 2 MG/ML IV SOLN: 1.0000 mg | INTRAVENOUS | Status: DC | PRN

## 2016-08-24 NOTE — Progress Notes (Signed)
PROGRESS NOTE    Jacqueline Hansen  ZYS:[id number] DOB: Oct 06, 1940 DOA: 08/23/2016 PCP: Keane Police, MD    Brief Narrative:  76 year old female who presented with right foot pain. Patient is known to have poorly controlled diabetes mellitus, peripheral artery disease, COPD, diastolic heart failure and tobacco abuse. Recent right foot injury, on 08/09/2016, required antibiotic therapy and I&D. Culture positive for skin flora and clostridium perfringens. For last 2 days prior to hospitalization, she noticed increased purulent drainage from the right foot, associated with edema, erythema and pain. On initial physical examination blood pressure 143/62, heart rate 87, temperature 95, respiratory rate 18. Her lungs were clear to auscultation bilaterally, heart S1-S2 present rhythmic, abdomen was soft nontender, lower extremities with right foot, dorsal surface with significant edema, erythema and tenderness. Open wound over the second, third metatarsal area with purulent drainage. Sodium 132, potassium 4.6, chloride 95, bicarbonate 26, glucose 357, BUN 13, creatinine 0.78, white count 13.1, hemoglobin 14.1, hematocrit 41.6, platelets 261. Right foot x-ray with diffuse soft tissue swelling, no evidence of osteomyelitis. Patient was admitted with right foot cellulitis complicated by uncontrolled hyperglycemia.   Assessment & Plan:   Principal Problem:   Cellulitis in diabetic foot ([place]) Active Problems:   DM (diabetes mellitus) ([place])   PAD (peripheral artery disease) ([place])   Cellulitis   Cellulitis and abscess of foot   COPD with chronic bronchitis ([place])   Tobacco abuse   1. Right foot cellulitis. Patient with right foot cellulitis, prior culture positive for clostridium perfringens, will increase dose of clindamycin to 900 mg and will add penicillin 5 m q 4 hours. Will follow on clinical response, patient may need to complete therapy with IV antibiotic therapy. Will increase frequency of short acting  opiates for now to improve pain control.   2. Uncontrolled T2DM,. Uncontrolled hyperglycemia, will continue iss for glucose cover and monitoring, will resume levimir 20 units. Patient tolerating po well, capillary glucose  323, 264,194. Will target more tight control in the setting of sever soft tissue infection at the right foot.  3. PAD. Will continue aspirin and statin therapy.   4. COPD. Stable with no exacerbation, will continue oxymetry monitoring, supplemental 02 per [place] as needed. Continue bronchodilator therapy with duoneb as needed.   5. Depression. Continue duloxetine, alprazolam, and trazodone.   DVT prophylaxis:  Code Status:  Family Communication:  Disposition Plan:    Consultants:     Procedures:     Antimicrobials:   clindamycin   Subjective: Positive pain on the right foot, moderate in intensity, worse to touch or ambulating, no radiation, improved with pain medications, no associated fever or chills, no nausea or vomiting.   Objective: Vitals:   08/23/16 1742 08/23/16 2025 08/23/16 2240 08/24/16 0554  BP: (!) 141/56 (!) 143/62 (!) 161/71 (!) 144/49  Pulse: 99 87 94 83  Resp: 18 18 20 18   Temp: 97.5 F (36.4 C) 97.5 F (36.4 C) 97.9 F (36.6 C) 98.4 F (36.9 C)  TempSrc: Oral Oral Oral Oral  SpO2: 95% 99% 97% 96%  Weight: 84.4 kg (186 lb)  88.8 kg (195 lb 12.8 oz)   Height: 5\' 2"  (1.575 m)  5\' 2"  (1.575 m)     Intake/Output Summary (Last 24 hours) at 08/24/16 0842 Last data filed at 08/24/16 0600  Gross per 24 hour  Intake             2150 ml  Output  0 ml  Net             2150 ml   Filed Weights   08/23/16 1742 08/23/16 2240  Weight: 84.4 kg (186 lb) 88.8 kg (195 lb 12.8 oz)    Examination:  General exam: deconditioned E ENT. No pallor or icterus.  Respiratory system: Clear to auscultation. Respiratory effort normal. No wheezing, rales or rhonchi.  Cardiovascular system: S1 & S2 heard, RRR. No JVD, murmurs, rubs,  gallops or clicks. No pedal edema. Gastrointestinal system: Abdomen is nondistended, soft and nontender. No organomegaly or masses felt. Normal bowel sounds heard. Central nervous system: Alert and oriented. No focal neurological deficits. Extremities: Symmetric 5 x 5 power. Skin: Right foot with edema, opening between the 3rd and 4th metatarsal, no purulence, positive discoloration at the plantar region at the base of the toes.      Data Reviewed: I have personally reviewed following labs and imaging studies  CBC:  Recent Labs Lab 08/23/16 1812 08/24/16 0432  WBC 13.1* 10.4  NEUTROABS 9.0*  --   HGB 14.1 12.4  HCT 41.6 37.0  MCV 86.5 87.1  PLT 261 161   Basic Metabolic Panel:  Recent Labs Lab 08/23/16 1812 08/24/16 0432  NA 132* 134*  K 4.6 4.5  CL 95* 101  CO2 26 26  GLUCOSE 357* 327*  BUN 13 10  CREATININE 0.78 0.60  CALCIUM 9.0 8.3*   GFR: Estimated Creatinine Clearance: 63.9 mL/min (by C-G formula based on SCr of 0.6 mg/dL). Liver Function Tests:  Recent Labs Lab 08/23/16 1812  AST 20  ALT 38  ALKPHOS 68  BILITOT 0.7  PROT 6.4*  ALBUMIN 3.3*   No results for input(s): LIPASE, AMYLASE in the last 168 hours. No results for input(s): AMMONIA in the last 168 hours. Coagulation Profile: No results for input(s): INR, PROTIME in the last 168 hours. Cardiac Enzymes: No results for input(s): CKTOTAL, CKMB, CKMBINDEX, TROPONINI in the last 168 hours. BNP (last 3 results) No results for input(s): PROBNP in the last 8760 hours. HbA1C: No results for input(s): HGBA1C in the last 72 hours. CBG:  Recent Labs Lab 08/23/16 2244 08/24/16 0746  GLUCAP 323* 264*   Lipid Profile: No results for input(s): CHOL, HDL, LDLCALC, TRIG, CHOLHDL, LDLDIRECT in the last 72 hours. Thyroid Function Tests: No results for input(s): TSH, T4TOTAL, FREET4, T3FREE, THYROIDAB in the last 72 hours. Anemia Panel: No results for input(s): VITAMINB12, FOLATE, FERRITIN, TIBC,  IRON, RETICCTPCT in the last 72 hours. Sepsis Labs:  Recent Labs Lab 08/23/16 1812  LATICACIDVEN 2.4*    Recent Results (from the past 240 hour(s))  Blood culture (routine x 2)     Status: None (Preliminary result)   Collection Time: 08/23/16  7:40 PM  Result Value Ref Range Status   Specimen Description BLOOD LEFT HAND  Final   Special Requests   Final    BOTTLES DRAWN AEROBIC AND ANAEROBIC Blood Culture results may not be optimal due to an inadequate volume of blood received in culture bottles   Culture PENDING  Incomplete   Report Status PENDING  Incomplete  Blood culture (routine x 2)     Status: None (Preliminary result)   Collection Time: 08/23/16  7:57 PM  Result Value Ref Range Status   Specimen Description BLOOD LEFT HAND  Final   Special Requests   Final    BOTTLES DRAWN AEROBIC ONLY Blood Culture results may not be optimal due to an inadequate volume of blood received  in culture bottles   Culture PENDING  Incomplete   Report Status PENDING  Incomplete         Radiology Studies: Dg Foot Complete Right  Result Date: 08/23/2016 CLINICAL DATA:  76 y/o  F; foot infection. EXAM: RIGHT FOOT COMPLETE - 3+ VIEW COMPARISON:  None. FINDINGS: There is no evidence of fracture or dislocation. There is no evidence of arthropathy or other focal bone abnormality. Diffuse soft tissue swelling of the foot. IMPRESSION: Diffuse soft tissue swelling of the foot. No radiographic evidence for osteomyelitis. Electronically Signed   By: Kristine Garbe M.D.   On: 08/23/2016 18:57        Scheduled Meds: . aspirin EC  81 mg Oral Daily  . DULoxetine  60 mg Oral Daily  . fluticasone  2 spray Each Nare Daily  . [START ON 08/25/2016] heparin  5,000 Units Subcutaneous Q8H  . insulin aspart  0-9 Units Subcutaneous TID WC  . ketoconazole  1 application Topical BID  . midodrine  5 mg Oral TID WC  . mometasone-formoterol  2 puff Inhalation BID  . pantoprazole  40 mg Oral Daily  .  potassium chloride  10 mEq Oral Daily  . rosuvastatin  40 mg Oral QHS  . senna  1 tablet Oral BID  . sodium chloride flush  3 mL Intravenous Q12H   Continuous Infusions: . sodium chloride    . clindamycin (CLEOCIN) IV Stopped (08/24/16 0414)     LOS: 1 day       Zachry Hopfensperger Gerome Apley, MD [facility] Pager [phone]  If 7PM-7AM, please contact night-coverage [email or web address] Password [facility] 08/24/2016, 8:42 AM

## 2016-08-24 NOTE — Progress Notes (Signed)
Dressing to patient's right foot changed per order.  Dressing clean, dry, and intact.

## 2016-08-24 NOTE — Progress Notes (Signed)
Inpatient Diabetes Program Recommendations  AACE/ADA: New Consensus Statement on Inpatient Glycemic Control (2015)  Target Ranges:  Prepandial:   less than 140 mg/dL      Peak postprandial:   less than 180 mg/dL (1-2 hours)      Critically ill patients:  140 - 180 mg/dL  Results for SERA, HITSMAN (MRN [id number]) as of 08/24/2016 07:21  Ref. Range 08/23/2016 18:12 08/24/2016 04:32  Glucose Latest Ref Range: 65 - 99 mg/dL 357 (H) 327 (H)   Results for GIANNINA, BARTOLOME (MRN [id number]) as of 08/24/2016 07:21  Ref. Range 08/23/2016 22:44  Glucose-Capillary Latest Ref Range: 65 - 99 mg/dL 323 (H)   Review of Glycemic Control  Diabetes history: DM2 Outpatient Diabetes medications: Lantus 20 units QHS Current orders for Inpatient glycemic control: Novolog 0-9 units TID with meals  Inpatient Diabetes Program Recommendations:  Insulin - Basal: Fasting lab glucose 327 mg/dl this morning. Please consider ordering Lantus 15 units daily starting now. Correction (SSI): If patient will remain NPO, please consider changing frequency of CBGs and Novolog to Q4H. If diet will be resumed, please consider ordering Novolog 0-5 units QHS for bedtime correction scale.  Thanks, Barnie Alderman, RN, MSN, CDE Diabetes Coordinator Inpatient Diabetes Program [phone] (Team Pager from 8am to 5pm)

## 2016-08-24 NOTE — Consult Note (Signed)
Reason for Consult: Right foot cellulitis, uncontrolled diabetes mellitus Referring Physician: Dr. Ross Ludwig Jacqueline Hansen is an 76 y.o. female.  HPI: Patient is a 76 year old white female who I previously saw several weeks ago for cellulitis of the right foot. She underwent incision and drainage of the abscess of the right foot on 08/09/2016. Clostridium species was found. She was discharged home with home health care. In talking with a home healthcare nurse, the patient was very noncompliant and did not get her prescription filled for her antibiotics for one week. She was sent to the emergency room by home health due to worsening cellulitis of the right foot. The patient denies any pain in the right foot.  Past Medical History:  Diagnosis Date  . Anxiety   . Arthritis    "joints" (09/08/2014)  . Asthma   . CHF (congestive heart failure) ([place])   . Chronic bronchitis ([place])    "get it q yr"  . COPD (chronic obstructive pulmonary disease) ([place])   . Depression   . Fibromyalgia   . Gastric ulcer   . GERD (gastroesophageal reflux disease)   . 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 History of blood transfusion ?2015   "cause I was throwing it up"  . History of hiatal hernia   . Hypercholesterolemia   . 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[place])   . On home oxygen therapy    "2L ususally at night time" (09/08/2014)  . Pulmonary embolism ([place])    "when I had my gallbladder taken out"  . Type II diabetes mellitus ([place])     Past Surgical History:  Procedure Laterality Date  . ABDOMINAL HYSTERECTOMY    . CARPAL TUNNEL RELEASE Bilateral   . CATARACT EXTRACTION W/ INTRAOCULAR LENS  IMPLANT, BILATERAL Bilateral   . COLONOSCOPY N/A 09/09/2014   Procedure: COLONOSCOPY;  Surgeon: Irene Shipper, MD;  Location: [place];  Service: Endoscopy;  Laterality: N/A;  . EXCISIONAL HEMORRHOIDECTOMY    . INCISION AND DRAINAGE ABSCESS Right 08/09/2016   Procedure: INCISION AND DRAINAGE ABSCESS;   Surgeon: Aviva Signs, MD;  Location: [facility];  Service: General;  Laterality: Right;  . LAPAROSCOPIC CHOLECYSTECTOMY    . MELANOMA EXCISION     "off my nose"  . TUBAL LIGATION      History reviewed. No pertinent family history.  Social History:  reports that she has been smoking Cigarettes.  She has a 16.00 pack-year smoking history. She has never used smokeless tobacco. She reports that she drinks alcohol. She reports that she does not use drugs.  Allergies:  Allergies  Allergen Reactions  . Adhesive [Tape] Other (See Comments)    Reaction:  Tears pts skin     Medications:  Prior to Admission:  Prescriptions Prior to Admission  Medication Sig Dispense Refill Last Dose  . albuterol (PROVENTIL HFA;VENTOLIN HFA) 108 (90 Base) MCG/ACT inhaler Inhale 1-2 puffs into the lungs every 6 (six) hours as needed for wheezing or shortness of breath.   08/23/2016 at Unknown time  . ALPRAZolam (XANAX) 0.5 MG tablet Take 0.5 mg by mouth 3 (three) times daily as needed for anxiety.    Past Month at Unknown time  . aspirin EC 81 MG tablet Take 81 mg by mouth daily.   08/23/2016 at Unknown time  . ciprofloxacin (CIPRO) 500 MG tablet Take 1 tablet (500 mg total) by mouth 2 (two) times daily. 20 tablet 0 08/23/2016 at Unknown time  .  clopidogrel (PLAVIX) 75 MG tablet Take 75 mg by mouth daily.   08/23/2016 at Unknown time  . DULoxetine (CYMBALTA) 60 MG capsule Take 60 mg by mouth daily.    08/23/2016 at Unknown time  . esomeprazole (NEXIUM) 40 MG capsule Take 40 mg by mouth daily at 12 noon.   08/23/2016 at Unknown time  . fluticasone (FLONASE) 50 MCG/ACT nasal spray Place 2 sprays into both nostrils daily.   08/23/2016 at Unknown time  . Fluticasone-Salmeterol (ADVAIR) 250-50 MCG/DOSE AEPB Inhale 1 puff into the lungs daily as needed.    unknown  . furosemide (LASIX) 20 MG tablet Take 1 tablet (20 mg total) by mouth daily. 30 tablet 11 08/23/2016 at Unknown time  . HYDROcodone-acetaminophen (NORCO/VICODIN)  5-325 MG tablet Take 1-2 tablets by mouth every 6 (six) hours as needed for moderate pain.    Past Week at Unknown time  . ibuprofen (ADVIL,MOTRIN) 800 MG tablet Take 800 mg by mouth every 8 (eight) hours as needed for mild pain or moderate pain.   Past Week at Unknown time  . insulin glargine (LANTUS) 100 UNIT/ML injection Inject 0.2 mLs (20 Units total) into the skin at bedtime. 10 mL 11 08/22/2016 at Unknown time  . ketoconazole (NIZORAL) 2 % cream Apply 1 application topically daily as needed for irritation (APPLIED TO FEET).    Past Week at Unknown time  . linaclotide (LINZESS) 145 MCG CAPS capsule Take 145 mcg by mouth daily as needed (for constipation).   Past Week at Unknown time  . loperamide (IMODIUM A-D) 2 MG tablet Take 2 mg by mouth 4 (four) times daily as needed for diarrhea or loose stools.   unknown  . meclizine (ANTIVERT) 12.5 MG tablet Take 12.5 mg by mouth 3 (three) times daily as needed for dizziness.   Past Week at Unknown time  . metFORMIN (GLUCOPHAGE) 500 MG tablet Take 500 mg by mouth 2 (two) times daily with a meal.    08/23/2016 at Unknown time  . midodrine (PROAMATINE) 5 MG tablet Take 5 mg by mouth 3 (three) times daily.   08/23/2016 at Unknown time  . mometasone-formoterol (DULERA) 200-5 MCG/ACT AERO Inhale 2 puffs into the lungs 2 (two) times daily.   08/23/2016 at Unknown time  . potassium chloride (K-DUR) 10 MEQ tablet Take 1 tablet (10 mEq total) by mouth daily. 30 tablet 0 08/23/2016 at Unknown time  . rosuvastatin (CRESTOR) 40 MG tablet Take 40 mg by mouth at bedtime.    08/22/2016 at Unknown time  . predniSONE (STERAPRED UNI-PAK 21 TAB) 10 MG (21) TBPK tablet Use as directed. (Patient not taking: Reported on 08/23/2016) 21 tablet 0 Completed Course at Unknown time   Scheduled: . aspirin EC  81 mg Oral Daily  . DULoxetine  60 mg Oral Daily  . fluticasone  2 spray Each Nare Daily  . [START ON 08/25/2016] heparin  5,000 Units Subcutaneous Q8H  . insulin aspart  0-9 Units  Subcutaneous TID WC  . ketoconazole  1 application Topical BID  . midodrine  5 mg Oral TID WC  . mometasone-formoterol  2 puff Inhalation BID  . pantoprazole  40 mg Oral Daily  . potassium chloride  10 mEq Oral Daily  . rosuvastatin  40 mg Oral QHS  . senna  1 tablet Oral BID  . sodium chloride flush  3 mL Intravenous Q12H    Results for orders placed or performed during the hospital encounter of 08/23/16 (from the past 48 hour(s))  C-reactive protein     Status: Abnormal   Collection Time: 08/23/16  6:10 PM  Result Value Ref Range   CRP 3.4 (H) <1.0 mg/dL    Comment: Performed at [facility], [place] [address] [place], [place] [address]  CBC with Differential     Status: Abnormal   Collection Time: 08/23/16  6:12 PM  Result Value Ref Range   WBC 13.1 (H) 4.0 - 10.5 K/uL   RBC 4.81 3.87 - 5.11 MIL/uL   Hemoglobin 14.1 12.0 - 15.0 g/dL   HCT 41.6 36.0 - 46.0 %   MCV 86.5 78.0 - 100.0 fL   MCH 29.3 26.0 - 34.0 pg   MCHC 33.9 30.0 - 36.0 g/dL   RDW 13.2 11.5 - 15.5 %   Platelets 261 150 - 400 K/uL   Neutrophils Relative % 69 %   Neutro Abs 9.0 (H) 1.7 - 7.7 K/uL   Lymphocytes Relative 22 %   Lymphs Abs 2.9 0.7 - 4.0 K/uL   Monocytes Relative 7 %   Monocytes Absolute 0.9 0.1 - 1.0 K/uL   Eosinophils Relative 2 %   Eosinophils Absolute 0.3 0.0 - 0.7 K/uL   Basophils Relative 0 %   Basophils Absolute 0.0 0.0 - 0.1 K/uL  Comprehensive metabolic panel     Status: Abnormal   Collection Time: 08/23/16  6:12 PM  Result Value Ref Range   Sodium 132 (L) 135 - 145 mmol/L   Potassium 4.6 3.5 - 5.1 mmol/L   Chloride 95 (L) 101 - 111 mmol/L   CO2 26 22 - 32 mmol/L   Glucose, Bld 357 (H) 65 - 99 mg/dL   BUN 13 6 - 20 mg/dL   Creatinine, Ser 0.78 0.44 - 1.00 mg/dL   Calcium 9.0 8.9 - 10.3 mg/dL   Total Protein 6.4 (L) 6.5 - 8.1 g/dL   Albumin 3.3 (L) 3.5 - 5.0 g/dL   AST 20 15 - 41 U/L   ALT 38 14 - 54 U/L   Alkaline Phosphatase 68 38 - 126 U/L   Total Bilirubin 0.7 0.3 - 1.2  mg/dL   GFR calc non Af Amer >60 >60 mL/min   GFR calc Af Amer >60 >60 mL/min    Comment: (NOTE) The eGFR has been calculated using the CKD EPI equation. This calculation has not been validated in all clinical situations. eGFR's persistently <60 mL/min signify possible Chronic Kidney Disease.    Anion gap 11 5 - 15  Sedimentation rate     Status: Abnormal   Collection Time: 08/23/16  6:12 PM  Result Value Ref Range   Sed Rate 30 (H) 0 - 22 mm/hr  Lactic acid, plasma     Status: Abnormal   Collection Time: 08/23/16  6:12 PM  Result Value Ref Range   Lactic Acid, Venous 2.4 (HH) 0.5 - 1.9 mmol/L    Comment: CRITICAL RESULT CALLED TO, READ BACK BY AND VERIFIED WITH: WILLIAMS,C ON 08/23/16 AT 1910 BY LOY,C   Blood culture (routine x 2)     Status: None (Preliminary result)   Collection Time: 08/23/16  7:40 PM  Result Value Ref Range   Specimen Description BLOOD LEFT HAND    Special Requests      BOTTLES DRAWN AEROBIC AND ANAEROBIC Blood Culture results may not be optimal due to an inadequate volume of blood received in culture bottles   Culture PENDING    Report Status PENDING   Blood culture (routine x 2)  Status: None (Preliminary result)   Collection Time: 08/23/16  7:57 PM  Result Value Ref Range   Specimen Description BLOOD LEFT HAND    Special Requests      BOTTLES DRAWN AEROBIC ONLY Blood Culture results may not be optimal due to an inadequate volume of blood received in culture bottles   Culture PENDING    Report Status PENDING   Glucose, capillary     Status: Abnormal   Collection Time: 08/23/16 10:44 PM  Result Value Ref Range   Glucose-Capillary 323 (H) 65 - 99 mg/dL   Comment 1 Notify RN    Comment 2 Document in Chart   Basic metabolic panel     Status: Abnormal   Collection Time: 08/24/16  4:32 AM  Result Value Ref Range   Sodium 134 (L) 135 - 145 mmol/L   Potassium 4.5 3.5 - 5.1 mmol/L   Chloride 101 101 - 111 mmol/L   CO2 26 22 - 32 mmol/L   Glucose, Bld  327 (H) 65 - 99 mg/dL   BUN 10 6 - 20 mg/dL   Creatinine, Ser 0.60 0.44 - 1.00 mg/dL   Calcium 8.3 (L) 8.9 - 10.3 mg/dL   GFR calc non Af Amer >60 >60 mL/min   GFR calc Af Amer >60 >60 mL/min    Comment: (NOTE) The eGFR has been calculated using the CKD EPI equation. This calculation has not been validated in all clinical situations. eGFR's persistently <60 mL/min signify possible Chronic Kidney Disease.    Anion gap 7 5 - 15  CBC     Status: None   Collection Time: 08/24/16  4:32 AM  Result Value Ref Range   WBC 10.4 4.0 - 10.5 K/uL   RBC 4.25 3.87 - 5.11 MIL/uL   Hemoglobin 12.4 12.0 - 15.0 g/dL   HCT 37.0 36.0 - 46.0 %   MCV 87.1 78.0 - 100.0 fL   MCH 29.2 26.0 - 34.0 pg   MCHC 33.5 30.0 - 36.0 g/dL   RDW 12.9 11.5 - 15.5 %   Platelets 221 150 - 400 K/uL    Dg Foot Complete Right  Result Date: 08/23/2016 CLINICAL DATA:  76 y/o  F; foot infection. EXAM: RIGHT FOOT COMPLETE - 3+ VIEW COMPARISON:  None. FINDINGS: There is no evidence of fracture or dislocation. There is no evidence of arthropathy or other focal bone abnormality. Diffuse soft tissue swelling of the foot. IMPRESSION: Diffuse soft tissue swelling of the foot. No radiographic evidence for osteomyelitis. Electronically Signed   By: Kristine Garbe M.D.   On: 08/23/2016 18:57    ROS:  A comprehensive review of systems was negative.  Blood pressure (!) 144/49, pulse 83, temperature 98.4 F (36.9 C), temperature source Oral, resp. rate 18, height 5' 2" (1.575 m), weight 195 lb 12.8 oz (88.8 kg), SpO2 96 %. Physical Exam: Pleasant white female in no acute distress Head is normocephalic, atraumatic Lungs clear auscultation with breath sounds bilaterally Heart examination reveals a regular rate and rhythm without S3, S4, murmurs Extremity examination reveals bilateral femoral pulses. Dorsalis pedis and posterior tibial pulses are noted in the right foot. No pain or tenderness noted. Erythema is present. A wound  is present between the second and third toes with dry eschar present. No significant purulent drainage is expressed. X-ray results reviewed Assessment/Plan: Impression: Cellulitis of right foot, uncontrolled diabetes mellitus. Patient noncompliant with outpatient therapy. No need for acute surgical intervention at this time. Plan: Wound care has been ordered.  Diet has been ordered for the patient. Will follow. Agree with clindamycin IV.  Aviva Signs 08/24/2016, 7:38 AM

## 2016-08-24 NOTE — Care Management Note (Signed)
Case Management Note  Patient Details  Name: Gene Colee MRN: [id number] Date of Birth: 03-31-1940  Subjective/Objective:                  Pt admitted from home with cellulitis. Previous admission for same, pt non-compliance with abx at home. Pt living with her daughter, has continuous supplemental oxygen pta. Active with [facility] nursing.   Action/Plan: Plan for return home with resumption of HH services through [facility]. Vaughan Basta, [facility] rep, aware of admission and will follow. Pt will need order to resume [place] services at DC.   Expected Discharge Date:  08/25/16               Expected Discharge Plan:  [place]  In-House Referral:  NA  Discharge planning Services  CM Consult  Post Acute Care Choice:  Home Health, Resumption of Svcs/PTA Provider Choice offered to:  Patient  HH Arranged:  RN [facility] Agency:  [place]  Status of Service:  In process, will continue to follow  Sherald Barge, RN 08/24/2016, 12:58 PM

## 2016-08-25 DIAGNOSIS — L03119 Cellulitis of unspecified part of limb: Secondary | ICD-10-CM

## 2016-08-25 DIAGNOSIS — E11628 Type 2 diabetes mellitus with other skin complications: Principal | ICD-10-CM

## 2016-08-25 LAB — BASIC METABOLIC PANEL
ANION GAP: 9 (ref 5–15)
BUN: 9 mg/dL (ref 6–20)
CO2: 27 mmol/L (ref 22–32)
Calcium: 8.6 mg/dL — ABNORMAL LOW (ref 8.9–10.3)
Chloride: 98 mmol/L — ABNORMAL LOW (ref 101–111)
Creatinine, Ser: 0.65 mg/dL (ref 0.44–1.00)
GFR calc Af Amer: >60 mL/min (ref >60)
GFR calc non Af Amer: >60 mL/min (ref >60)
GLUCOSE: 242 mg/dL — AB (ref 65–99)
POTASSIUM: 5 mmol/L (ref 3.5–5.1)
Sodium: 134 mmol/L — ABNORMAL LOW (ref 135–145)

## 2016-08-25 LAB — CBC WITH DIFFERENTIAL/PLATELET
BASOS ABS: 0 10*3/uL (ref 0.0–0.1)
Basophils Relative: 0 %
Eosinophils Absolute: 0.3 10*3/uL (ref 0.0–0.7)
Eosinophils Relative: 3 %
HEMATOCRIT: 38.3 % (ref 36.0–46.0)
Hemoglobin: 12.8 g/dL (ref 12.0–15.0)
LYMPHS PCT: 26 %
Lymphs Abs: 3.1 10*3/uL (ref 0.7–4.0)
MCH: 29.2 pg (ref 26.0–34.0)
MCHC: 33.4 g/dL (ref 30.0–36.0)
MCV: 87.2 fL (ref 78.0–100.0)
Monocytes Absolute: 0.8 10*3/uL (ref 0.1–1.0)
Monocytes Relative: 7 %
NEUTROS ABS: 7.8 10*3/uL — AB (ref 1.7–7.7)
NEUTROS PCT: 64 %
PLATELETS: 230 10*3/uL (ref 150–400)
RBC: 4.39 MIL/uL (ref 3.87–5.11)
RDW: 13.2 % (ref 11.5–15.5)
WBC: 12.1 10*3/uL — AB (ref 4.0–10.5)

## 2016-08-25 LAB — GLUCOSE, CAPILLARY
GLUCOSE-CAPILLARY: 154 mg/dL — AB (ref 65–99)
Glucose-Capillary: 226 mg/dL — ABNORMAL HIGH (ref 65–99)
Glucose-Capillary: 261 mg/dL — ABNORMAL HIGH (ref 65–99)
Glucose-Capillary: 350 mg/dL — ABNORMAL HIGH (ref 65–99)
Glucose-Capillary: 422 mg/dL — ABNORMAL HIGH (ref 65–99)
Glucose-Capillary: 49 mg/dL — ABNORMAL LOW (ref 65–99)

## 2016-08-25 LAB — GLUCOSE, RANDOM: Glucose, Bld: 359 mg/dL — ABNORMAL HIGH (ref 65–99)

## 2016-08-25 MED ORDER — INSULIN ASPART 100 UNIT/ML ~~LOC~~ SOLN
6.0000 [IU] | Freq: Once | SUBCUTANEOUS | Status: AC
  Administered 2016-08-25: 6 [IU] via SUBCUTANEOUS

## 2016-08-25 MED ORDER — INSULIN ASPART 100 UNIT/ML ~~LOC~~ SOLN
12.0000 [IU] | Freq: Once | SUBCUTANEOUS | Status: AC
  Administered 2016-08-25: 12 [IU] via SUBCUTANEOUS

## 2016-08-25 MED ORDER — HYDROCODONE-ACETAMINOPHEN 5-325 MG PO TABS
2.0000 | ORAL_TABLET | ORAL | Status: DC | PRN
  Administered 2016-08-25 – 2016-08-26 (×3): 2 via ORAL
  Filled 2016-08-25 (×3): qty 2

## 2016-08-25 MED ORDER — INSULIN ASPART 100 UNIT/ML ~~LOC~~ SOLN
0.0000 [IU] | Freq: Three times a day (TID) | SUBCUTANEOUS | Status: DC
  Administered 2016-08-25 – 2016-08-26 (×2): 5 [IU] via SUBCUTANEOUS
  Administered 2016-08-26: 3 [IU] via SUBCUTANEOUS

## 2016-08-25 NOTE — Progress Notes (Signed)
PROGRESS NOTE    Jacqueline Hansen  LTJ:[id number] DOB: 1940/10/24 DOA: 08/23/2016 PCP: Keane Police, MD    Brief Narrative:  76 year old female who presented with right foot pain. Patient is known to have poorly controlled diabetes mellitus, peripheral artery disease, COPD, diastolic heart failure and tobacco abuse. Recent right foot injury, on 08/09/2016, required antibiotic therapy and I&D. Culture positive for skin flora and clostridium perfringens. For last 2 days prior to hospitalization, she noticed increased purulent drainage from the right foot, associated with edema, erythema and pain. On initial physical examination blood pressure 143/62, heart rate 87, temperature 95, respiratory rate 18. Her lungs were clear to auscultation bilaterally, heart S1-S2 present rhythmic, abdomen was soft nontender, lower extremities with right foot, dorsal surface with significant edema, erythema and tenderness. Open wound over the second, third metatarsal area with purulent drainage. Sodium 132, potassium 4.6, chloride 95, bicarbonate 26, glucose 357, BUN 13, creatinine 0.78, white count 13.1, hemoglobin 14.1, hematocrit 41.6, platelets 261. Right foot x-ray with diffuse soft tissue swelling, no evidence of osteomyelitis. Patient was admitted with right foot cellulitis complicated by uncontrolled hyperglycemia.   Assessment & Plan:   Principal Problem:   Cellulitis in diabetic foot ([place]) Active Problems:   DM (diabetes mellitus) ([place])   PAD (peripheral artery disease) ([place])   Cellulitis   Cellulitis and abscess of foot   COPD with chronic bronchitis ([place])   Tobacco abuse   1. Right foot cellulitis. Clostridium perfringens, patient tolerating well clindamycin and penicillin, consulted with ID over the phone, will plan to change to augmentin po at discharge. WBC stable at 12. Patient had remain afebrile, but persistent pain on the right foot, will increase hydrocodone-acetaminophen to 2 tablets as needed  and continue as needed IV morphine. Physical therapy evaluation.   2. Uncontrolled T2DM. Continue uncontrolled hyperglycemia, will continue iss for glucose cover and monitoring, and  Levimir 20 units for basal regimen, will continue to calculate requirements and adjust basal insulin as indicated. Patient tolerating po well, capillary glucose  231, 328, 350, 261. Needs tight control due to severe skin infection.   3. PAD. Continue aspirin and statin therapy.   4. COPD. No exacerbation, will continue oxymetry monitoring, supplemental 02 per [place] as needed. Bronchodilator therapy as needed.   5. Depression. Continue duloxetine, alprazolam, and trazodone. No confusion or agitation.     DVT prophylaxis: enoxaparin  Code Status: Full  Family Communication:  Disposition Plan: Home    Consultants:   Surgery   Procedures:     Antimicrobials:   Clindamaycin  Penicillin   Subjective: Patient with persistent pain on the right foot, not completely improved with analgesics, no nausea or vomiting, no diarrhea or rash.   Objective: Vitals:   08/24/16 2006 08/24/16 2217 08/25/16 0430 08/25/16 0805  BP:  106/72 124/68   Pulse:  82 84   Resp:  16 18   Temp:  97.9 F (36.6 C) 97.8 F (36.6 C)   TempSrc:  Oral Oral   SpO2: 94% 95% 96% 95%  Weight:      Height:        Intake/Output Summary (Last 24 hours) at 08/25/16 1132 Last data filed at 08/25/16 0900  Gross per 24 hour  Intake             1330 ml  Output                0 ml  Net  1330 ml   Filed Weights   08/23/16 1742 08/23/16 2240  Weight: 84.4 kg (186 lb) 88.8 kg (195 lb 12.8 oz)    Examination:  General exam: not in pain E ENT: mild pallor, no icterus.   Respiratory system: Clear to auscultation. Respiratory effort normal. Cardiovascular system: S1 & S2 heard, RRR. No JVD, murmurs, rubs, gallops or clicks. No pedal edema. Gastrointestinal system: Abdomen is nondistended, soft and nontender. No  organomegaly or masses felt. Normal bowel sounds heard. Central nervous system: Alert and oriented. No focal neurological deficits. Extremities: Symmetric 5 x 5 power. Skin: right foot with edema and erythema, improved no purulnce    Data Reviewed: I have personally reviewed following labs and imaging studies  CBC:  Recent Labs Lab 08/23/16 1812 08/24/16 0432 08/25/16 0447  WBC 13.1* 10.4 12.1*  NEUTROABS 9.0*  --  7.8*  HGB 14.1 12.4 12.8  HCT 41.6 37.0 38.3  MCV 86.5 87.1 87.2  PLT 261 221 741   Basic Metabolic Panel:  Recent Labs Lab 08/23/16 1812 08/24/16 0432 08/25/16 0447  NA 132* 134* 134*  K 4.6 4.5 5.0  CL 95* 101 98*  CO2 26 26 27   GLUCOSE 357* 327* 242*  BUN 13 10 9   CREATININE 0.78 0.60 0.65  CALCIUM 9.0 8.3* 8.6*   GFR: Estimated Creatinine Clearance: 63.9 mL/min (by C-G formula based on SCr of 0.65 mg/dL). Liver Function Tests:  Recent Labs Lab 08/23/16 1812  AST 20  ALT 38  ALKPHOS 68  BILITOT 0.7  PROT 6.4*  ALBUMIN 3.3*   No results for input(s): LIPASE, AMYLASE in the last 168 hours. No results for input(s): AMMONIA in the last 168 hours. Coagulation Profile: No results for input(s): INR, PROTIME in the last 168 hours. Cardiac Enzymes: No results for input(s): CKTOTAL, CKMB, CKMBINDEX, TROPONINI in the last 168 hours. BNP (last 3 results) No results for input(s): PROBNP in the last 8760 hours. HbA1C: No results for input(s): HGBA1C in the last 72 hours. CBG:  Recent Labs Lab 08/24/16 1131 08/24/16 1630 08/24/16 2220 08/25/16 0037 08/25/16 0759  GLUCAP 193* 231* 328* 350* 261*   Lipid Profile: No results for input(s): CHOL, HDL, LDLCALC, TRIG, CHOLHDL, LDLDIRECT in the last 72 hours. Thyroid Function Tests: No results for input(s): TSH, T4TOTAL, FREET4, T3FREE, THYROIDAB in the last 72 hours. Anemia Panel: No results for input(s): VITAMINB12, FOLATE, FERRITIN, TIBC, IRON, RETICCTPCT in the last 72 hours. Sepsis  Labs:  Recent Labs Lab 08/23/16 1812  LATICACIDVEN 2.4*    Recent Results (from the past 240 hour(s))  Blood culture (routine x 2)     Status: None (Preliminary result)   Collection Time: 08/23/16  7:40 PM  Result Value Ref Range Status   Specimen Description BLOOD LEFT HAND  Final   Special Requests   Final    BOTTLES DRAWN AEROBIC AND ANAEROBIC Blood Culture results may not be optimal due to an inadequate volume of blood received in culture bottles   Culture NO GROWTH 2 DAYS  Final   Report Status PENDING  Incomplete  Blood culture (routine x 2)     Status: None (Preliminary result)   Collection Time: 08/23/16  7:57 PM  Result Value Ref Range Status   Specimen Description BLOOD LEFT HAND  Final   Special Requests   Final    BOTTLES DRAWN AEROBIC ONLY Blood Culture results may not be optimal due to an inadequate volume of blood received in culture bottles   Culture NO  GROWTH 2 DAYS  Final   Report Status PENDING  Incomplete         Radiology Studies: Dg Foot Complete Right  Result Date: 08/23/2016 CLINICAL DATA:  76 y/o  F; foot infection. EXAM: RIGHT FOOT COMPLETE - 3+ VIEW COMPARISON:  None. FINDINGS: There is no evidence of fracture or dislocation. There is no evidence of arthropathy or other focal bone abnormality. Diffuse soft tissue swelling of the foot. IMPRESSION: Diffuse soft tissue swelling of the foot. No radiographic evidence for osteomyelitis. Electronically Signed   By: Kristine Garbe M.D.   On: 08/23/2016 18:57        Scheduled Meds: . aspirin EC  81 mg Oral Daily  . DULoxetine  60 mg Oral Daily  . fluticasone  2 spray Each Nare Daily  . heparin  5,000 Units Subcutaneous Q8H  . insulin aspart  0-9 Units Subcutaneous TID WC  . insulin glargine  20 Units Subcutaneous Daily  . ketoconazole  1 application Topical BID  . midodrine  5 mg Oral TID WC  . mometasone-formoterol  2 puff Inhalation BID  . pantoprazole  40 mg Oral Daily  . potassium  chloride  10 mEq Oral Daily  . rosuvastatin  40 mg Oral QHS  . senna  1 tablet Oral BID  . sodium chloride flush  3 mL Intravenous Q12H   Continuous Infusions: . sodium chloride    . clindamycin (CLEOCIN) IV Stopped (08/25/16 0442)  . pencillin G potassium IV 4 Million Units (08/25/16 0829)     LOS: 2 days       Mauricio Gerome Apley, MD [facility] Pager [phone]  If 7PM-7AM, please contact night-coverage [email or web address] Password [facility] 08/25/2016, 11:32 AM

## 2016-08-25 NOTE — Progress Notes (Signed)
  Subjective: Patient denies any foot pain.  Objective: Vital signs in last 24 hours: Temp:  [97.6 F (36.4 C)-97.9 F (36.6 C)] 97.8 F (36.6 C) (06/21 0430) Pulse Rate:  [78-84] 84 (06/21 0430) Resp:  [16-18] 18 (06/21 0430) BP: (106-148)/(56-72) 124/68 (06/21 0430) SpO2:  [94 %-97 %] 95 % (06/21 0805) Last BM Date: 08/24/16  Intake/Output from previous day: 06/20 0701 - 06/21 0700 In: 2050 [P.O.:1200; IV Piggyback:850] Out: 1 [Urine:1] Intake/Output this shift: Total I/O In: 240 [P.O.:240] Out: -   General appearance: alert, cooperative and no distress Extremities: Right foot without significant cellulitis or swelling. Wound healing well by secondary intention. No significant purulent drainage. Second toe not to ischemic looking.  Lab Results:   Recent Labs  08/24/16 0432 08/25/16 0447  WBC 10.4 12.1*  HGB 12.4 12.8  HCT 37.0 38.3  PLT 221 230   BMET  Recent Labs  08/24/16 0432 08/25/16 0447  NA 134* 134*  K 4.5 5.0  CL 101 98*  CO2 26 27  GLUCOSE 327* 242*  BUN 10 9  CREATININE 0.60 0.65  CALCIUM 8.3* 8.6*   PT/INR No results for input(s): LABPROT, INR in the last 72 hours.  Studies/Results: Dg Foot Complete Right  Result Date: 08/23/2016 CLINICAL DATA:  76 y/o  F; foot infection. EXAM: RIGHT FOOT COMPLETE - 3+ VIEW COMPARISON:  None. FINDINGS: There is no evidence of fracture or dislocation. There is no evidence of arthropathy or other focal bone abnormality. Diffuse soft tissue swelling of the foot. IMPRESSION: Diffuse soft tissue swelling of the foot. No radiographic evidence for osteomyelitis. Electronically Signed   By: Kristine Garbe M.D.   On: 08/23/2016 18:57    Anti-infectives: Anti-infectives    Start     Dose/Rate Route Frequency Ordered Stop   08/24/16 1300  penicillin G potassium injection 4 Million Units  Status:  Discontinued     4 Million Units Intravenous Every 4 hours 08/24/16 1201 08/24/16 1229   08/24/16 1300   penicillin G potassium 4 Million Units in dextrose 5 % 250 mL IVPB     4 Million Units 250 mL/hr over 60 Minutes Intravenous Every 4 hours 08/24/16 1229     08/24/16 1230  clindamycin (CLEOCIN) IVPB 900 mg     900 mg 100 mL/hr over 30 Minutes Intravenous Every 8 hours 08/24/16 1201     08/23/16 2200  clindamycin (CLEOCIN) IVPB 300 mg  Status:  Discontinued     300 mg 100 mL/hr over 30 Minutes Intravenous Every 6 hours 08/23/16 2107 08/24/16 1201   08/23/16 1815  clindamycin (CLEOCIN) IVPB 600 mg     600 mg 100 mL/hr over 30 Minutes Intravenous  Once 08/23/16 1806 08/23/16 1859      Assessment/Plan: Impression: Cellulitis of right foot, resolving Plan: No need for surgical intervention at this time. Would discharge patient home tomorrow and continue antibiotics and local wound care.  LOS: 2 days    Aviva Signs 08/25/2016

## 2016-08-25 NOTE — Progress Notes (Signed)
Inpatient Diabetes Program Recommendations  AACE/ADA: New Consensus Statement on Inpatient Glycemic Control (2015)  Target Ranges:  Prepandial:   less than 140 mg/dL      Peak postprandial:   less than 180 mg/dL (1-2 hours)      Critically ill patients:  140 - 180 mg/dL  Results for Jacqueline Hansen, Jacqueline Hansen (MRN [id number]) as of 08/25/2016 09:30  Ref. Range 08/24/2016 07:46 08/24/2016 11:31 08/24/2016 16:30 08/24/2016 22:20 08/25/2016 00:37 08/25/2016 07:59  Glucose-Capillary Latest Ref Range: 65 - 99 mg/dL 264 (H) 193 (H) 231 (H) 328 (H) 350 (H) 261 (H)   Results for Jacqueline Hansen, Jacqueline Hansen (MRN [id number]) as of 08/25/2016 09:30  Ref. Range 08/06/2016 20:10  Hemoglobin A1C Latest Ref Range: 4.8 - 5.6 % 9.2 (H)   Review of Glycemic Control  Diabetes history: DM2 Outpatient Diabetes medications: Lantus 20 units QHS Current orders for Inpatient glycemic control: Lantus 20 units daily, Novolog 0-9 units TID with meals  Inpatient Diabetes Program Recommendations: Insulin - Basal: Please consider increasing Lantus to 26 units daily (based on 88 kg x 0.3 units). Patient has already received Lantus 20 units today so if Lantus increased as recommended please order a one time dose of Lantus 6 units x 1 now and begin Lantus 25 units daily on 08/26/16. Correction (SSI): Please consider ordering Novolog 0-5 units QHS for bedtime correction. Insulin - Meal Coverage: Please consider ordering Novolog 5 units TID with meals for meal coverage if patient eats at least 50% of meals. Outpatient DM medications: Patient's Lantus dose was decreased from 50 units QHS to 20 units QHS at hospital discharge on 08/11/16 due to hypoglycemia. MD likely needs to make additional adjustments to outpatient Lantus dose.  Thanks, Barnie Alderman, RN, MSN, CDE Diabetes Coordinator Inpatient Diabetes Program [phone] (Team Pager from 8am to 5pm)

## 2016-08-25 NOTE — Progress Notes (Signed)
[facility]  Patient Status: Active (receiving services up to time of hospitalization)  [facility] is providing the following services: RN and PT  If patient discharges after hours, please call [phone].   Jacqueline Hansen 08/25/2016, 9:03 AM

## 2016-08-26 DIAGNOSIS — Z720 Tobacco use: Secondary | ICD-10-CM

## 2016-08-26 LAB — BASIC METABOLIC PANEL
ANION GAP: 6 (ref 5–15)
BUN: 5 mg/dL — ABNORMAL LOW (ref 6–20)
CALCIUM: 8.3 mg/dL — AB (ref 8.9–10.3)
CO2: 29 mmol/L (ref 22–32)
CREATININE: 0.63 mg/dL (ref 0.44–1.00)
Chloride: 97 mmol/L — ABNORMAL LOW (ref 101–111)
GFR calc Af Amer: >60 mL/min (ref >60)
GFR calc non Af Amer: >60 mL/min (ref >60)
GLUCOSE: 288 mg/dL — AB (ref 65–99)
Potassium: 4.8 mmol/L (ref 3.5–5.1)
Sodium: 132 mmol/L — ABNORMAL LOW (ref 135–145)

## 2016-08-26 LAB — CBC WITH DIFFERENTIAL/PLATELET
BASOS PCT: 0 %
Basophils Absolute: 0 10*3/uL (ref 0.0–0.1)
Eosinophils Absolute: 0.4 10*3/uL (ref 0.0–0.7)
Eosinophils Relative: 4 %
HEMATOCRIT: 36.7 % (ref 36.0–46.0)
Hemoglobin: 12.3 g/dL (ref 12.0–15.0)
Lymphocytes Relative: 23 %
Lymphs Abs: 2.3 10*3/uL (ref 0.7–4.0)
MCH: 28.9 pg (ref 26.0–34.0)
MCHC: 33.5 g/dL (ref 30.0–36.0)
MCV: 86.2 fL (ref 78.0–100.0)
MONO ABS: 0.7 10*3/uL (ref 0.1–1.0)
MONOS PCT: 8 %
NEUTROS ABS: 6.4 10*3/uL (ref 1.7–7.7)
Neutrophils Relative %: 65 %
Platelets: 205 10*3/uL (ref 150–400)
RBC: 4.26 MIL/uL (ref 3.87–5.11)
RDW: 13.3 % (ref 11.5–15.5)
WBC: 9.8 10*3/uL (ref 4.0–10.5)

## 2016-08-26 LAB — GLUCOSE, CAPILLARY
Glucose-Capillary: 219 mg/dL — ABNORMAL HIGH (ref 65–99)
Glucose-Capillary: 197 mg/dL — ABNORMAL HIGH (ref 65–99)

## 2016-08-26 MED ORDER — INSULIN ASPART 100 UNIT/ML ~~LOC~~ SOLN: 0.0000 [IU] | Freq: Three times a day (TID) | SUBCUTANEOUS | 11 refills | Status: AC

## 2016-08-26 MED ORDER — AMOXICILLIN-POT CLAVULANATE 875-125 MG PO TABS: 1.0000 | ORAL_TABLET | Freq: Two times a day (BID) | ORAL | 0 refills | Status: DC

## 2016-08-26 MED ORDER — IBUPROFEN 400 MG PO TABS: 800.0000 mg | ORAL_TABLET | Freq: Three times a day (TID) | ORAL | 0 refills | Status: DC | PRN

## 2016-08-26 MED ORDER — AMOXICILLIN-POT CLAVULANATE 875-125 MG PO TABS: 1.0000 | ORAL_TABLET | Freq: Two times a day (BID) | ORAL | 0 refills | Status: AC

## 2016-08-26 MED ORDER — HYDROCODONE-ACETAMINOPHEN 5-325 MG PO TABS: 1.0000 | ORAL_TABLET | Freq: Four times a day (QID) | ORAL | 0 refills | Status: AC | PRN

## 2016-08-26 NOTE — Progress Notes (Addendum)
Inpatient Diabetes Program Recommendations  AACE/ADA: New Consensus Statement on Inpatient Glycemic Control (2015)  Target Ranges:  Prepandial:   less than 140 mg/dL      Peak postprandial:   less than 180 mg/dL (1-2 hours)      Critically ill patients:  140 - 180 mg/dL   Results for Jacqueline Hansen, Jacqueline Hansen (MRN [id number]) as of 08/26/2016 07:35  Ref. Range 08/26/2016 05:18  Glucose Latest Ref Range: 65 - 99 mg/dL 288 (H)   Results for Jacqueline Hansen, Jacqueline Hansen (MRN [id number]) as of 08/26/2016 07:35  Ref. Range 08/25/2016 07:59 08/25/2016 11:42 08/25/2016 16:25 08/25/2016 19:56 08/25/2016 20:45  Glucose-Capillary Latest Ref Range: 65 - 99 mg/dL 261 (H)  Novolog 5 units  Lantus 20 units 422 (H)  Novolog 12 units 226 (H)  Novolog 5 units 49 (L) 154 (H)    Review of Glycemic Control  Diabetes history: DM2 Outpatient Diabetes medications: Lantus 20 units QHS Current orders for Inpatient glycemic control: Lantus 20 units daily, Novolog 0-15 units TID with meals  Inpatient Diabetes Program Recommendations: Insulin - Basal: Please consider increasing Lantus to 23 units daily.  Correction (SSI): Please consider decreasing Novolog to sensitive scale ( 0-9 units) and also ordering Novolog 0-5 units QHS for bedtime correction. Insulin - Meal Coverage: Please consider ordering Novolog 4 units TID with meals for meal coverage if patient eats at least 50% of meals. Outpatient DM medications: Patient's Lantus dose was decreased from 50 units QHS to 20 units QHS at hospital discharge on 08/11/16 due to hypoglycemia. MD likely needs to make additional adjustments to outpatient Lantus dose.  Thanks, Barnie Alderman, RN, MSN, CDE Diabetes Coordinator Inpatient Diabetes Program [phone] (Team Pager from 8am to 5pm)

## 2016-08-26 NOTE — Care Management Important Message (Signed)
Important Message  Patient Details  Name: Jacqueline Hansen MRN: [id number] Date of Birth: 1940/05/09   Medicare Important Message Given:  Yes    Sherald Barge, RN 08/26/2016, 9:56 AM

## 2016-08-26 NOTE — Discharge Summary (Signed)
Physician Discharge Summary  Jacqueline Hansen IRJ:[id number] DOB: 1940/12/18 DOA: 08/23/2016  PCP: Keane Police, MD  Admit date: 08/23/2016 Discharge date: 08/26/2016  Admitted From: Home  Disposition:  Home   Recommendations for Outpatient Follow-up:  1. Follow up with PCP in 1- week 2. Patient discharged on Augmentin for the next 10 days.  3. To resume home health.   Home Health: Yes  Equipment/Devices: No   Discharge Condition: Stable  CODE STATUS: Full  Diet recommendation: Heart Healthy / Carb Modified   Brief/Interim Summary: 76 year old female who presented with right foot pain. Patient is known to have poorly controlled diabetes mellitus, peripheral artery disease, COPD, diastolic heart failure and tobacco abuse. Recent right foot injury, on 08/09/2016, required antibiotic therapy and I&D. Culture positive for skin flora and clostridium perfringens. For last 2 days prior to hospitalization, she noticed increased purulent drainage from the right foot, associated with edema, erythema and pain. On initial physical examination blood pressure 143/62, heart rate 87, temperature 95, respiratory rate 18. Her lungs were clear to auscultation bilaterally, heart S1-S2 present rhythmic, abdomen was soft nontender, lower extremities with right foot, dorsal surface with significant edema, erythema and tenderness. Open wound over the second, third metatarsal area with purulent drainage. Sodium 132, potassium 4.6, chloride 95, bicarbonate 26, glucose 357, BUN 13, creatinine 0.78, white count 13.1, hemoglobin 14.1, hematocrit 41.6, platelets 261. Right foot x-ray with diffuse soft tissue swelling, no evidence of osteomyelitis. Patient was admitted with right foot cellulitis complicated by uncontrolled hyperglycemia.  1. Right foot cellulitis, due to clostridium perfringens. Patient was admitted to the medical unit, she was placed on IV antibiotic therapy with clindamycin and penicillin, the erythema, and  edema improved. Patient has remained afebrile, discharge white cell count 9.8. Patient will continue antibiotic therapy with Augmentin for the next 10 days. Case was discussed with ID over the phone. Patient will need a tight glucose control. Patient was seen by surgery, no intervention needed this point, with recommendations to continue wound care and antibiotics therapy. Pain control with ibuprofen 400 mg and hydrocodone-acetaminophen.   2. Uncontrolled type 2 diabetes mellitus. Patient was placed on insulin sliding scale for glucose coverage and monitoring, basal insulin with Levemir 20 units daily, capillary glucose over last 24 hours, 350, 261, for 22, 226, 49, 154. At home patient on 50 units insulin Lantus. Continue insulin NovoLog sliding scale with meals. Resume metformin.  Home health has been arranged. Recommend tight glucose control to allow appropriate wound healing.  3. Peripheral artery disease. Will continue aspirin, clopidogrel and statin.  4. COPD. Stable with no signs of exacerbation, patient received as needed bronchodilator therapy. Continue advair and dulera.   5. Depression. Continue duloxetine, alprazolam and trazodone.  6. Chronic diastolic heart failure. No signs of exacerbation, continue furosemide. Continue on midodrine.     Discharge Diagnoses:  Principal Problem:   Cellulitis in diabetic foot ([place]) Active Problems:   DM (diabetes mellitus) ([place])   PAD (peripheral artery disease) ([place])   Cellulitis   Cellulitis and abscess of foot   COPD with chronic bronchitis ([place])   Tobacco abuse    Discharge Instructions   Allergies as of 08/26/2016      Reactions   Adhesive [tape] Other (See Comments)   Reaction:  Tears pts skin       Medication List    STOP taking these medications   ciprofloxacin 500 MG tablet Commonly known as:  CIPRO   predniSONE 10 MG (21) Tbpk tablet Commonly known  as:  STERAPRED UNI-PAK 21 TAB     TAKE these medications    albuterol 108 (90 Base) MCG/ACT inhaler Commonly known as:  PROVENTIL HFA;VENTOLIN HFA Inhale 1-2 puffs into the lungs every 6 (six) hours as needed for wheezing or shortness of breath.   ALPRAZolam 0.5 MG tablet Commonly known as:  XANAX Take 0.5 mg by mouth 3 (three) times daily as needed for anxiety.   amoxicillin-clavulanate 875-125 MG tablet Commonly known as:  AUGMENTIN Take 1 tablet by mouth 2 (two) times daily.   aspirin EC 81 MG tablet Take 81 mg by mouth daily.   clopidogrel 75 MG tablet Commonly known as:  PLAVIX Take 75 mg by mouth daily.   DULERA 200-5 MCG/ACT Aero Generic drug:  mometasone-formoterol Inhale 2 puffs into the lungs 2 (two) times daily.   DULoxetine 60 MG capsule Commonly known as:  CYMBALTA Take 60 mg by mouth daily.   esomeprazole 40 MG capsule Commonly known as:  NEXIUM Take 40 mg by mouth daily at 12 noon.   fluticasone 50 MCG/ACT nasal spray Commonly known as:  FLONASE Place 2 sprays into both nostrils daily.   Fluticasone-Salmeterol 250-50 MCG/DOSE Aepb Commonly known as:  ADVAIR Inhale 1 puff into the lungs daily as needed.   furosemide 20 MG tablet Commonly known as:  LASIX Take 1 tablet (20 mg total) by mouth daily.   HYDROcodone-acetaminophen 5-325 MG tablet Commonly known as:  NORCO/VICODIN Take 1 tablet by mouth every 6 (six) hours as needed for moderate pain. What changed:  how much to take   ibuprofen 400 MG tablet Commonly known as:  ADVIL,MOTRIN Take 2 tablets (800 mg total) by mouth every 8 (eight) hours as needed for mild pain or moderate pain. What changed:  medication strength   insulin aspart 100 UNIT/ML injection Commonly known as:  novoLOG Inject 0-10 Units into the skin 3 (three) times daily with meals. For glucose 150 to 200 use 2 units, 201 to 250 use 4 units, 251-300 use 6 units, 301-350 use 8 units, 351 or greater 10 units.   insulin glargine 100 UNIT/ML injection Commonly known as:  LANTUS Inject 0.2  mLs (20 Units total) into the skin at bedtime.   ketoconazole 2 % cream Commonly known as:  NIZORAL Apply 1 application topically daily as needed for irritation (APPLIED TO FEET).   linaclotide 145 MCG Caps capsule Commonly known as:  LINZESS Take 145 mcg by mouth daily as needed (for constipation).   loperamide 2 MG tablet Commonly known as:  IMODIUM A-D Take 2 mg by mouth 4 (four) times daily as needed for diarrhea or loose stools.   meclizine 12.5 MG tablet Commonly known as:  ANTIVERT Take 12.5 mg by mouth 3 (three) times daily as needed for dizziness.   metFORMIN 500 MG tablet Commonly known as:  GLUCOPHAGE Take 500 mg by mouth 2 (two) times daily with a meal.   midodrine 5 MG tablet Commonly known as:  PROAMATINE Take 5 mg by mouth 3 (three) times daily.   potassium chloride 10 MEQ tablet Commonly known as:  K-DUR Take 1 tablet (10 mEq total) by mouth daily.   rosuvastatin 40 MG tablet Commonly known as:  CRESTOR Take 40 mg by mouth at bedtime.       Allergies  Allergen Reactions  . Adhesive [Tape] Other (See Comments)    Reaction:  Tears pts skin     Consultations:  Surgery  ID over the phone    Procedures/Studies:  Mr Foot Right W Wo Contrast  Result Date: 08/08/2016 CLINICAL DATA:  Right foot pain and swelling. EXAM: MRI OF THE RIGHT FOREFOOT WITHOUT AND WITH CONTRAST TECHNIQUE: Multiplanar, multisequence MR imaging of the right forefoot was performed before and after the administration of intravenous contrast. CONTRAST:  58mL MULTIHANCE GADOBENATE DIMEGLUMINE 529 MG/ML IV SOLN COMPARISON:  None. FINDINGS: Bones/Joint/Cartilage No marrow signal abnormality. No fracture or dislocation. Normal alignment. No joint effusion. Moderate osteoarthritis of the first MTP joint. Ligaments Collateral ligaments are intact. Muscles and Tendons Flexor, peroneal and extensor compartment tendons are intact. Muscles are normal in signal. No muscle atrophy. Soft tissue  Generalized soft tissue edema and enhancement of the midfoot and forefoot. Focal area of nonenhancing tissue between the second and third metatarsal heads and toes measuring approximately 2.6 x 3.2 x 2.8 cm with interspersed areas rounded low signal foci likely reflecting air most concerning for necrotic tissue. No focal fluid collection. No soft tissue mass. IMPRESSION: 1. No evidence of osteomyelitis of the right forefoot. 2. Cellulitis of the midfoot and forefoot. 3. Focal area of nonenhancing tissue between the second and third metatarsal heads and toes measuring approximately 2.6 x 3.2 x 2.8 cm with interspersed areas rounded low signal foci likely reflecting air most concerning for necrotic tissue. Electronically Signed   By: Kathreen Devoid   On: 08/08/2016 13:37   Dg Chest Portable 1 View  Result Date: 08/12/2016 CLINICAL DATA:  Cough and shortness of breath. EXAM: PORTABLE CHEST 1 VIEW COMPARISON:  08/08/2016 FINDINGS: Cardiomegaly with possible increase. Atherosclerosis of the thoracic aorta. Again seen vascular congestion, increased. There is progressive peribronchial cuffing. Minimal fluid in the right minor fissure with possible small pleural effusions. Streaky right greater than left basilar opacity. No pneumothorax. Chronic change of the right shoulder. IMPRESSION: Progressive peribronchial cuffing suggesting pulmonary edema. Increased vascular congestion with cardiomegaly and possible small pleural effusions. Findings suggest CHF. Electronically Signed   By: Jeb Levering M.D.   On: 08/12/2016 05:56   Dg Chest Port 1 View  Result Date: 08/08/2016 CLINICAL DATA:  Initial evaluation for acute cough. History of COPD, CHF, asthma, PE. EXAM: PORTABLE CHEST 1 VIEW COMPARISON:  Prior radiograph from 09/07/2014. FINDINGS: Mild cardiomegaly, stable from previous. Mediastinal silhouette within normal limits. Lungs hypoinflated. Underlying changes related COPD. Mild diffuse pulmonary vascular congestion  without frank pulmonary edema. No pleural effusion. No focal infiltrates. No pneumothorax. No acute osseous abnormality. Degenerative changes noted about the shoulders bilaterally. IMPRESSION: 1. Mild cardiomegaly with perihilar vascular congestion without overt pulmonary edema. 2. Underlying COPD. 3. No other active cardiopulmonary disease. Electronically Signed   By: Jeannine Boga M.D.   On: 08/08/2016 21:28   Dg Foot 2 Views Right  Result Date: 08/06/2016 CLINICAL DATA:  Acute onset of right foot swelling and erythema. Right foot pain. Tack removed from plantar surface of foot 2 days ago. Initial encounter. EXAM: RIGHT FOOT - 2 VIEW COMPARISON:  None. FINDINGS: There is no evidence of fracture or dislocation. No definite osseous erosion is seen, though evaluation for osteomyelitis is limited on radiograph. The joint spaces are preserved. There is no evidence of talar subluxation; the subtalar joint is unremarkable in appearance. Diffuse soft tissue swelling is noted about the forefoot and midfoot. Prominent soft tissue air is seen tracking about the plantar aspect of the forefoot, between the second and third metatarsophalangeal joints. IMPRESSION: 1. No evidence fracture or dislocation. 2. No definite osseous erosion seen, though evaluation for osteomyelitis is limited on radiograph,. If  there is significant clinical concern for osteomyelitis, MRI of the right foot could be considered for further evaluation. 3. Prominent soft tissue air tracking about the plantar aspect of the forefoot, between the second and third metatarsophalangeal joints. This raises concern for infection with a gas producing organism. Underlying necrotizing fasciitis cannot be excluded. These results were called by telephone at the time of interpretation on 08/06/2016 at 10:48 pm to Dr. Noemi Chapel, who verbally acknowledged these results. Electronically Signed   By: Garald Balding M.D.   On: 08/06/2016 22:48   Dg Foot Complete  Right  Result Date: 08/23/2016 CLINICAL DATA:  76 y/o  F; foot infection. EXAM: RIGHT FOOT COMPLETE - 3+ VIEW COMPARISON:  None. FINDINGS: There is no evidence of fracture or dislocation. There is no evidence of arthropathy or other focal bone abnormality. Diffuse soft tissue swelling of the foot. IMPRESSION: Diffuse soft tissue swelling of the foot. No radiographic evidence for osteomyelitis. Electronically Signed   By: Kristine Garbe M.D.   On: 08/23/2016 18:57       Subjective: Patient feeling better, pain with better control, no nausea or vomiting, no chest pain or dyspnea.   Discharge Exam: Vitals:   08/25/16 2130 08/26/16 0537  BP: (!) 138/59 (!) 141/63  Pulse: 84 79  Resp: 18 18  Temp: 97.5 F (36.4 C) 97.8 F (36.6 C)   Vitals:   08/25/16 2012 08/25/16 2130 08/26/16 0537 08/26/16 0754  BP:  (!) 138/59 (!) 141/63   Pulse:  84 79   Resp:  18 18   Temp:  97.5 F (36.4 C) 97.8 F (36.6 C)   TempSrc:  Oral Oral   SpO2: 95% 97% 98% 97%  Weight:      Height:        General: Pt is alert, awake, not in acute distress Cardiovascular: RRR, S1/S2 +, no rubs, no gallops Respiratory: CTA bilaterally, no wheezing, no rhonchi Abdominal: Soft, NT, ND, bowel sounds + Extremities: no edema, no cyanosis Right foot with decrease edema and resolving erythema, no purulence, open wound between the 2 and 3 metatarsal.    The results of significant diagnostics from this hospitalization (including imaging, microbiology, ancillary and laboratory) are listed below for reference.     Microbiology: Recent Results (from the past 240 hour(s))  Blood culture (routine x 2)     Status: None (Preliminary result)   Collection Time: 08/23/16  7:40 PM  Result Value Ref Range Status   Specimen Description BLOOD LEFT HAND  Final   Special Requests   Final    BOTTLES DRAWN AEROBIC AND ANAEROBIC Blood Culture results may not be optimal due to an inadequate volume of blood received in  culture bottles   Culture NO GROWTH 3 DAYS  Final   Report Status PENDING  Incomplete  Blood culture (routine x 2)     Status: None (Preliminary result)   Collection Time: 08/23/16  7:57 PM  Result Value Ref Range Status   Specimen Description BLOOD LEFT HAND  Final   Special Requests   Final    BOTTLES DRAWN AEROBIC ONLY Blood Culture results may not be optimal due to an inadequate volume of blood received in culture bottles   Culture NO GROWTH 3 DAYS  Final   Report Status PENDING  Incomplete     Labs: BNP (last 3 results)  Recent Labs  08/12/16 0623  BNP 841.3*   Basic Metabolic Panel:  Recent Labs Lab 08/23/16 1812 08/24/16 0432 08/25/16 0447  08/25/16 1237 08/26/16 0518  NA 132* 134* 134*  --  132*  K 4.6 4.5 5.0  --  4.8  CL 95* 101 98*  --  97*  CO2 26 26 27   --  29  GLUCOSE 357* 327* 242* 359* 288*  BUN 13 10 9   --  5*  CREATININE 0.78 0.60 0.65  --  0.63  CALCIUM 9.0 8.3* 8.6*  --  8.3*   Liver Function Tests:  Recent Labs Lab 08/23/16 1812  AST 20  ALT 38  ALKPHOS 68  BILITOT 0.7  PROT 6.4*  ALBUMIN 3.3*   No results for input(s): LIPASE, AMYLASE in the last 168 hours. No results for input(s): AMMONIA in the last 168 hours. CBC:  Recent Labs Lab 08/23/16 1812 08/24/16 0432 08/25/16 0447 08/26/16 0518  WBC 13.1* 10.4 12.1* 9.8  NEUTROABS 9.0*  --  7.8* 6.4  HGB 14.1 12.4 12.8 12.3  HCT 41.6 37.0 38.3 36.7  MCV 86.5 87.1 87.2 86.2  PLT 261 221 230 205   Cardiac Enzymes: No results for input(s): CKTOTAL, CKMB, CKMBINDEX, TROPONINI in the last 168 hours. BNP: Invalid input(s): POCBNP CBG:  Recent Labs Lab 08/25/16 1142 08/25/16 1625 08/25/16 1956 08/25/16 2045 08/26/16 0811  GLUCAP 422* 226* 49* 154* 219*   D-Dimer No results for input(s): DDIMER in the last 72 hours. Hgb A1c No results for input(s): HGBA1C in the last 72 hours. Lipid Profile No results for input(s): CHOL, HDL, LDLCALC, TRIG, CHOLHDL, LDLDIRECT in the last  72 hours. Thyroid function studies No results for input(s): TSH, T4TOTAL, T3FREE, THYROIDAB in the last 72 hours.  Invalid input(s): FREET3 Anemia work up No results for input(s): VITAMINB12, FOLATE, FERRITIN, TIBC, IRON, RETICCTPCT in the last 72 hours. Urinalysis No results found for: COLORURINE, APPEARANCEUR, [place], [place], [place], [place], [place], [place], PROTEINUR, UROBILINOGEN, NITRITE, LEUKOCYTESUR Sepsis Labs Invalid input(s): PROCALCITONIN,  WBC,  LACTICIDVEN Microbiology Recent Results (from the past 240 hour(s))  Blood culture (routine x 2)     Status: None (Preliminary result)   Collection Time: 08/23/16  7:40 PM  Result Value Ref Range Status   Specimen Description BLOOD LEFT HAND  Final   Special Requests   Final    BOTTLES DRAWN AEROBIC AND ANAEROBIC Blood Culture results may not be optimal due to an inadequate volume of blood received in culture bottles   Culture NO GROWTH 3 DAYS  Final   Report Status PENDING  Incomplete  Blood culture (routine x 2)     Status: None (Preliminary result)   Collection Time: 08/23/16  7:57 PM  Result Value Ref Range Status   Specimen Description BLOOD LEFT HAND  Final   Special Requests   Final    BOTTLES DRAWN AEROBIC ONLY Blood Culture results may not be optimal due to an inadequate volume of blood received in culture bottles   Culture NO GROWTH 3 DAYS  Final   Report Status PENDING  Incomplete     Time coordinating discharge: 45 minutes  SIGNED:   Tawni Millers, MD  [facility] 08/26/2016, 8:54 AM Pager   If 7PM-7AM, please contact night-coverage [email or web address] Password [email or web address]

## 2016-08-26 NOTE — Evaluation (Signed)
Physical Therapy Evaluation Patient Details Name: Jacqueline Hansen MRN: [id number] DOB: November 05, 1940 Today's Date: 08/26/2016   History of Present Illness  Revonda Sherrin is a 76yo white female who comes to [facility] on 6/19 d/t worsening status of Rt foot wound and cellulitis. PMH: DM, PAD, COPD, tobacco use, PE, CHF, fibromyalgia. At baseline pt lives with her daughter, independent in all ADL and IADL, duaghter provides transportation. AMB limited distances in the community with SPC, no recent falls history.   Clinical Impression  Pt admitted with above diagnosis. Pt currently with functional limitations due to the deficits listed below (see "PT Problem List"). Pt in bed upon arrival, RN entering shortly thereafter to dress wound and pass meds. Pt performing all functional mobility with supervision to modified independence with SPC or bed rail. The patient is generally weaker than her baseline, with AMB limitations due to onset of dizziness at 70 feet, stable for return to room: VSS. Good safety awareness noted and safe use of SPC. Adequate support at home as needed. Pt will benefit from skilled PT intervention to increase independence and safety with basic mobility in preparation for discharge to the venue listed below.       Follow Up Recommendations Home health PT    Equipment Recommendations  None recommended by PT (DME available at home )    Recommendations for Other Services       Precautions / Restrictions Precautions Precautions: None      Mobility  Bed Mobility Overal bed mobility: Independent                Transfers Overall transfer level: Modified independent               General transfer comment: additional time and bed rail/chair arm needed  Ambulation/Gait Ambulation/Gait assistance: Min guard Ambulation Distance (Feet): 120 Feet Assistive device: Straight cane       General Gait Details: some stable dizziness after 70 feet. VSS  Stairs             Wheelchair Mobility    Modified Rankin (Stroke Patients Only)       Balance Overall balance assessment: Modified Independent;History of Falls;No apparent balance deficits (not formally assessed)                                           Pertinent Vitals/Pain Pain Assessment: 0-10 Pain Score:  (7/10 at rest, 9/10 during AMB, 8/10 recovery feet elevated.) Pain Location: Rt foot wound  Pain Descriptors / Indicators: Aching Pain Intervention(s): Limited activity within patient's tolerance;Monitored during session;Repositioned    Home Living Family/patient expects to be discharged to:: Private residence Living Arrangements: Children Available Help at Discharge: Family;Available 24 hours/day Type of Home: Mobile home Home Access: Stairs to enter Entrance Stairs-Rails: Right Entrance Stairs-Number of Steps: 3 Home Layout: One level Home Equipment: Walker - 2 wheels;Cane - single point;Bedside commode      Prior Function Level of Independence: Independent with assistive device(s)         Comments: SPC for limited community distances, daughter assists with somehousework; no recent falls, but documented falls history in 2016      Hand Dominance        Extremity/Trunk Assessment   Upper Extremity Assessment Upper Extremity Assessment: Generalized weakness;Overall [facility] for tasks assessed    Lower Extremity Assessment Lower Extremity Assessment: Generalized weakness;Overall [facility] for tasks assessed  Communication   Communication: No difficulties  Cognition Arousal/Alertness: Awake/alert Behavior During Therapy: WFL for tasks assessed/performed Overall Cognitive Status: Within Functional Limits for tasks assessed                                        General Comments      Exercises     Assessment/Plan    PT Assessment Patient needs continued PT services  PT Problem List Decreased strength;Decreased activity  tolerance;Decreased mobility;Pain;Obesity       PT Treatment Interventions DME instruction;Gait training;Stair training;Functional mobility training;Therapeutic activities;Therapeutic exercise;Balance training    PT Goals (Current goals can be found in the Care Plan section)  Acute Rehab PT Goals Patient Stated Goal: return to independent AMB in community  PT Goal Formulation: With patient Time For Goal Achievement: 09/09/16 Potential to Achieve Goals: Good    Frequency Min 2X/week   Barriers to discharge        Co-evaluation               AM-PAC PT "6 Clicks" Daily Activity  Outcome Measure Difficulty turning over in bed (including adjusting bedclothes, sheets and blankets)?: A Lot Difficulty moving from lying on back to sitting on the side of the bed? : A Lot Difficulty sitting down on and standing up from a chair with arms (e.g., wheelchair, bedside commode, etc,.)?: A Lot Help needed moving to and from a bed to chair (including a wheelchair)?: A Lot Help needed walking in hospital room?: A Lot Help needed climbing 3-5 steps with a railing? : A Lot 6 Click Score: 12    End of Session Equipment Utilized During Treatment: Gait belt Activity Tolerance: Patient tolerated treatment well;Treatment limited secondary to medical complications (Comment) (dizziness) Patient left: in chair;with call bell/phone within reach Nurse Communication: Mobility status PT Visit Diagnosis: History of falling (Z91.81);Difficulty in walking, not elsewhere classified (R26.2);Dizziness and giddiness (R42)    Time: [id number] PT Time Calculation (min) (ACUTE ONLY): 24 min   Charges:   PT Evaluation $PT Eval Moderate Complexity: 1 Procedure     PT G Codes:       11:54 AM, Sep 07, 2016 Etta Grandchild, PT, DPT Physical Therapist - [place] [phone] (Office)    Bernadett Milian C 2016/09/07, 11:52 AM

## 2016-08-26 NOTE — Care Management Note (Signed)
Case Management Note  Patient Details  Name: Jacqueline Hansen MRN: [id number] Date of Birth: [birth year]  Expected Discharge Date:  08/26/16               Expected Discharge Plan:  [place]  In-House Referral:  NA  Discharge planning Services  CM Consult  Post Acute Care Choice:  Home Health, Resumption of Svcs/PTA Provider Choice offered to:  Patient  HH Arranged:  RN [facility] Agency:  [place]  Status of Service:  Completed, signed off  Additional Comments: Pt discharging home today with resumption of [place] services. Manuela Schwartz, [facility] rep, aware of DC and will obtain pt info from chart. Pt aware HH has 48hrs to make resumption visit. No other needs communicated. Pt says she has requested Rx be sent to [place] because last admission her abx got sent to [facility] and she could not get anyone to go to [place] to pick them up.   Sherald Barge, RN 08/26/2016, 9:56 AM

## 2016-08-26 NOTE — Progress Notes (Signed)
Discharge instructions read to patient and her daughter.  Both verbalized understanding of instructions Discharged to home with family

## 2016-08-26 NOTE — Progress Notes (Signed)
  Subjective: Patient has no complaints.  Objective: Vital signs in last 24 hours: Temp:  [97.5 F (36.4 C)-97.8 F (36.6 C)] 97.8 F (36.6 C) (06/22 0537) Pulse Rate:  [75-84] 79 (06/22 0537) Resp:  [18] 18 (06/22 0537) BP: (127-145)/(59-114) 141/63 (06/22 0537) SpO2:  [95 %-98 %] 97 % (06/22 0754) Last BM Date: 08/24/16  Intake/Output from previous day: 06/21 0701 - 06/22 0700 In: 480 [P.O.:480] Out: 300 [Urine:300] Intake/Output this shift: No intake/output data recorded.  General appearance: alert, cooperative and no distress Extremities: Right foot with minimal edema. Wound is healing okay without purulent drainage.  Lab Results:   Recent Labs  08/25/16 0447 08/26/16 0518  WBC 12.1* 9.8  HGB 12.8 12.3  HCT 38.3 36.7  PLT 230 205   BMET  Recent Labs  08/25/16 0447 08/25/16 1237 08/26/16 0518  NA 134*  --  132*  K 5.0  --  4.8  CL 98*  --  97*  CO2 27  --  29  GLUCOSE 242* 359* 288*  BUN 9  --  5*  CREATININE 0.65  --  0.63  CALCIUM 8.6*  --  8.3*   PT/INR No results for input(s): LABPROT, INR in the last 72 hours.  Studies/Results: No results found.  Anti-infectives: Anti-infectives    Start     Dose/Rate Route Frequency Ordered Stop   08/26/16 0000  amoxicillin-clavulanate (AUGMENTIN) 875-125 MG tablet     1 tablet Oral 2 times daily 08/26/16 0917 09/05/16 2359   08/24/16 1300  penicillin G potassium injection 4 Million Units  Status:  Discontinued     4 Million Units Intravenous Every 4 hours 08/24/16 1201 08/24/16 1229   08/24/16 1300  penicillin G potassium 4 Million Units in dextrose 5 % 250 mL IVPB     4 Million Units 250 mL/hr over 60 Minutes Intravenous Every 4 hours 08/24/16 1229     08/24/16 1230  clindamycin (CLEOCIN) IVPB 900 mg     900 mg 100 mL/hr over 30 Minutes Intravenous Every 8 hours 08/24/16 1201     08/23/16 2200  clindamycin (CLEOCIN) IVPB 300 mg  Status:  Discontinued     300 mg 100 mL/hr over 30 Minutes Intravenous  Every 6 hours 08/23/16 2107 08/24/16 1201   08/23/16 1815  clindamycin (CLEOCIN) IVPB 600 mg     600 mg 100 mL/hr over 30 Minutes Intravenous  Once 08/23/16 1806 08/23/16 1859      Assessment/Plan: Impression: Diabetic foot wound, slowly healing Plan: Okay for discharge with home health. Will follow patient as an outpatient.  LOS: 3 days    Aviva Signs 08/26/2016

## 2016-08-28 LAB — CULTURE, BLOOD (ROUTINE X 2)
CULTURE: NO GROWTH
Culture: NO GROWTH

## 2016-09-13 ENCOUNTER — Encounter: Payer: Self-pay | Admitting: General Surgery

## 2016-09-13 ENCOUNTER — Ambulatory Visit (INDEPENDENT_AMBULATORY_CARE_PROVIDER_SITE_OTHER): Payer: Self-pay | Admitting: General Surgery

## 2016-09-13 VITALS — BP 143/54 | HR 99 | Temp 98.9°F | Resp 18 | Ht 61.0 in | Wt 200.0 lb

## 2016-09-13 DIAGNOSIS — Z09 Encounter for follow-up examination after completed treatment for conditions other than malignant neoplasm: Secondary | ICD-10-CM

## 2016-09-13 MED ORDER — TRAMADOL HCL 50 MG PO TABS: 50.0000 mg | ORAL_TABLET | Freq: Two times a day (BID) | ORAL | 0 refills | Status: DC | PRN

## 2016-09-13 NOTE — Progress Notes (Signed)
Subjective:     Jacqueline Hansen  Status post incision and drainage of right foot abscess. Patient is being seen by home health. She is trying to get her blood sugars under better control.  Is having some incisional pain. Objective:    BP (!) 143/54   Pulse 99   Temp 98.9 F (37.2 C)   Resp 18   Ht 5\' 1"  (1.549 m)   Wt 200 lb (90.7 kg)   BMI 37.79 kg/m   General:  alert, cooperative, no distress and Unkempt  Right foot incision healing well by secondary intention. No significant purulent drainage noted. Cleaned with peroxide.     Assessment:    Doing well postoperatively.    Plan:   Continue wound care through home health. Follow-up here in 2 weeks.

## 2016-09-27 ENCOUNTER — Encounter: Payer: Self-pay | Admitting: General Surgery

## 2016-09-27 ENCOUNTER — Ambulatory Visit (INDEPENDENT_AMBULATORY_CARE_PROVIDER_SITE_OTHER): Payer: Self-pay | Admitting: General Surgery

## 2016-09-27 VITALS — BP 123/78 | HR 95 | Temp 98.4°F | Resp 18 | Ht 61.0 in | Wt 199.0 lb

## 2016-09-27 DIAGNOSIS — Z09 Encounter for follow-up examination after completed treatment for conditions other than malignant neoplasm: Secondary | ICD-10-CM

## 2016-09-27 MED ORDER — SULFAMETHOXAZOLE-TRIMETHOPRIM 800-160 MG PO TABS: 1.0000 | ORAL_TABLET | Freq: Two times a day (BID) | ORAL | 0 refills | Status: DC

## 2016-09-27 NOTE — Progress Notes (Signed)
Subjective:     Jacqueline Hansen    For follow-up, status post I&D of right foot abscess.    Patient does not report any purulent drainage. Objective:    BP 123/78   Pulse 95   Temp 98.4 F (36.9 C)   Resp 18   Ht 5\' 1"  (1.549 m)   Wt 199 lb (90.3 kg)   BMI 37.60 kg/m   General:  alert, cooperative and no distress    Right foot is slightly erythematous diffusely, but no purulent drainage is noted.  The wound has a dry eschar present deep in the wound.    Multiple hypertrophic toenails are present.     Assessment:    Right foot wound healing okay, but there is mild erythema present.    Plan:     To be cautious, I will give her a 10 day course of Bactrim.  I have referred her to Podiatry for further evaluation and treatment.  Follow up here as needed.

## 2018-08-18 ENCOUNTER — Emergency Department (HOSPITAL_COMMUNITY): Payer: Medicare HMO

## 2018-08-18 ENCOUNTER — Encounter (HOSPITAL_COMMUNITY): Payer: Self-pay | Admitting: Emergency Medicine

## 2018-08-18 ENCOUNTER — Inpatient Hospital Stay (HOSPITAL_COMMUNITY)
Admission: EM | Admit: 2018-08-18 | Discharge: 2018-08-22 | DRG: 378 | Disposition: A | Discharge status: Home Health | Payer: Medicare HMO | Attending: Internal Medicine | Admitting: Internal Medicine

## 2018-08-18 ENCOUNTER — Other Ambulatory Visit: Payer: Self-pay

## 2018-08-18 DIAGNOSIS — Z1159 Encounter for screening for other viral diseases: Secondary | ICD-10-CM

## 2018-08-18 DIAGNOSIS — K2961 Other gastritis with bleeding: Secondary | ICD-10-CM | POA: Diagnosis present

## 2018-08-18 DIAGNOSIS — F329 Major depressive disorder, single episode, unspecified: Secondary | ICD-10-CM | POA: Diagnosis present

## 2018-08-18 DIAGNOSIS — Z825 Family history of asthma and other chronic lower respiratory diseases: Secondary | ICD-10-CM

## 2018-08-18 DIAGNOSIS — I480 Paroxysmal atrial fibrillation: Secondary | ICD-10-CM | POA: Diagnosis present

## 2018-08-18 DIAGNOSIS — Z794 Long term (current) use of insulin: Secondary | ICD-10-CM

## 2018-08-18 DIAGNOSIS — I739 Peripheral vascular disease, unspecified: Secondary | ICD-10-CM | POA: Diagnosis present

## 2018-08-18 DIAGNOSIS — E1143 Type 2 diabetes mellitus with diabetic autonomic (poly)neuropathy: Secondary | ICD-10-CM | POA: Diagnosis present

## 2018-08-18 DIAGNOSIS — K920 Hematemesis: Secondary | ICD-10-CM | POA: Diagnosis not present

## 2018-08-18 DIAGNOSIS — K259 Gastric ulcer, unspecified as acute or chronic, without hemorrhage or perforation: Secondary | ICD-10-CM | POA: Diagnosis not present

## 2018-08-18 DIAGNOSIS — Z8249 Family history of ischemic heart disease and other diseases of the circulatory system: Secondary | ICD-10-CM

## 2018-08-18 DIAGNOSIS — K921 Melena: Secondary | ICD-10-CM | POA: Diagnosis present

## 2018-08-18 DIAGNOSIS — E119 Type 2 diabetes mellitus without complications: Secondary | ICD-10-CM

## 2018-08-18 DIAGNOSIS — B3781 Candidal esophagitis: Secondary | ICD-10-CM | POA: Diagnosis present

## 2018-08-18 DIAGNOSIS — K254 Chronic or unspecified gastric ulcer with hemorrhage: Secondary | ICD-10-CM | POA: Diagnosis present

## 2018-08-18 DIAGNOSIS — N179 Acute kidney failure, unspecified: Secondary | ICD-10-CM | POA: Diagnosis present

## 2018-08-18 DIAGNOSIS — K3189 Other diseases of stomach and duodenum: Secondary | ICD-10-CM | POA: Diagnosis not present

## 2018-08-18 DIAGNOSIS — E872 Acidosis: Secondary | ICD-10-CM | POA: Diagnosis present

## 2018-08-18 DIAGNOSIS — I10 Essential (primary) hypertension: Secondary | ICD-10-CM | POA: Diagnosis present

## 2018-08-18 DIAGNOSIS — R112 Nausea with vomiting, unspecified: Secondary | ICD-10-CM

## 2018-08-18 DIAGNOSIS — E876 Hypokalemia: Secondary | ICD-10-CM

## 2018-08-18 DIAGNOSIS — T18128A Food in esophagus causing other injury, initial encounter: Secondary | ICD-10-CM | POA: Diagnosis not present

## 2018-08-18 DIAGNOSIS — Z801 Family history of malignant neoplasm of trachea, bronchus and lung: Secondary | ICD-10-CM

## 2018-08-18 DIAGNOSIS — K922 Gastrointestinal hemorrhage, unspecified: Secondary | ICD-10-CM | POA: Diagnosis not present

## 2018-08-18 DIAGNOSIS — F17210 Nicotine dependence, cigarettes, uncomplicated: Secondary | ICD-10-CM | POA: Diagnosis present

## 2018-08-18 DIAGNOSIS — D72829 Elevated white blood cell count, unspecified: Secondary | ICD-10-CM | POA: Diagnosis present

## 2018-08-18 DIAGNOSIS — E7800 Pure hypercholesterolemia, unspecified: Secondary | ICD-10-CM | POA: Diagnosis present

## 2018-08-18 DIAGNOSIS — A419 Sepsis, unspecified organism: Secondary | ICD-10-CM | POA: Diagnosis not present

## 2018-08-18 DIAGNOSIS — I5032 Chronic diastolic (congestive) heart failure: Secondary | ICD-10-CM | POA: Diagnosis present

## 2018-08-18 DIAGNOSIS — E861 Hypovolemia: Secondary | ICD-10-CM | POA: Diagnosis present

## 2018-08-18 DIAGNOSIS — E860 Dehydration: Secondary | ICD-10-CM

## 2018-08-18 DIAGNOSIS — D509 Iron deficiency anemia, unspecified: Secondary | ICD-10-CM | POA: Diagnosis present

## 2018-08-18 DIAGNOSIS — K219 Gastro-esophageal reflux disease without esophagitis: Secondary | ICD-10-CM | POA: Diagnosis present

## 2018-08-18 DIAGNOSIS — K3184 Gastroparesis: Secondary | ICD-10-CM | POA: Diagnosis present

## 2018-08-18 DIAGNOSIS — I4891 Unspecified atrial fibrillation: Secondary | ICD-10-CM | POA: Diagnosis not present

## 2018-08-18 DIAGNOSIS — Z6835 Body mass index (BMI) 35.0-35.9, adult: Secondary | ICD-10-CM | POA: Diagnosis not present

## 2018-08-18 DIAGNOSIS — I679 Cerebrovascular disease, unspecified: Secondary | ICD-10-CM | POA: Diagnosis present

## 2018-08-18 DIAGNOSIS — Z720 Tobacco use: Secondary | ICD-10-CM | POA: Diagnosis not present

## 2018-08-18 DIAGNOSIS — Z833 Family history of diabetes mellitus: Secondary | ICD-10-CM

## 2018-08-18 DIAGNOSIS — K210 Gastro-esophageal reflux disease with esophagitis: Secondary | ICD-10-CM | POA: Diagnosis not present

## 2018-08-18 DIAGNOSIS — E785 Hyperlipidemia, unspecified: Secondary | ICD-10-CM | POA: Diagnosis present

## 2018-08-18 DIAGNOSIS — M1990 Unspecified osteoarthritis, unspecified site: Secondary | ICD-10-CM | POA: Diagnosis present

## 2018-08-18 DIAGNOSIS — Z7902 Long term (current) use of antithrombotics/antiplatelets: Secondary | ICD-10-CM

## 2018-08-18 DIAGNOSIS — K5900 Constipation, unspecified: Secondary | ICD-10-CM | POA: Diagnosis present

## 2018-08-18 DIAGNOSIS — R195 Other fecal abnormalities: Secondary | ICD-10-CM | POA: Diagnosis present

## 2018-08-18 DIAGNOSIS — J449 Chronic obstructive pulmonary disease, unspecified: Secondary | ICD-10-CM | POA: Diagnosis present

## 2018-08-18 DIAGNOSIS — K209 Esophagitis, unspecified without bleeding: Secondary | ICD-10-CM | POA: Diagnosis present

## 2018-08-18 DIAGNOSIS — F419 Anxiety disorder, unspecified: Secondary | ICD-10-CM | POA: Diagnosis present

## 2018-08-18 DIAGNOSIS — D500 Iron deficiency anemia secondary to blood loss (chronic): Secondary | ICD-10-CM | POA: Diagnosis present

## 2018-08-18 DIAGNOSIS — K5790 Diverticulosis of intestine, part unspecified, without perforation or abscess without bleeding: Secondary | ICD-10-CM | POA: Diagnosis present

## 2018-08-18 DIAGNOSIS — D649 Anemia, unspecified: Secondary | ICD-10-CM | POA: Diagnosis present

## 2018-08-18 DIAGNOSIS — Z9981 Dependence on supplemental oxygen: Secondary | ICD-10-CM

## 2018-08-18 DIAGNOSIS — R9431 Abnormal electrocardiogram [ECG] [EKG]: Secondary | ICD-10-CM | POA: Diagnosis present

## 2018-08-18 DIAGNOSIS — Z7951 Long term (current) use of inhaled steroids: Secondary | ICD-10-CM

## 2018-08-18 DIAGNOSIS — R51 Headache: Secondary | ICD-10-CM | POA: Diagnosis present

## 2018-08-18 DIAGNOSIS — J42 Unspecified chronic bronchitis: Secondary | ICD-10-CM | POA: Diagnosis not present

## 2018-08-18 DIAGNOSIS — D62 Acute posthemorrhagic anemia: Secondary | ICD-10-CM | POA: Diagnosis not present

## 2018-08-18 LAB — URINALYSIS, ROUTINE W REFLEX MICROSCOPIC
Bilirubin Urine: NEGATIVE
Glucose, UA: NEGATIVE mg/dL
Hgb urine dipstick: NEGATIVE
Ketones, ur: NEGATIVE mg/dL
Leukocytes,Ua: NEGATIVE
Nitrite: NEGATIVE
Protein, ur: NEGATIVE mg/dL
Specific Gravity, Urine: 1.015 (ref 1.005–1.030)
pH: 5 (ref 5.0–8.0)

## 2018-08-18 LAB — COMPREHENSIVE METABOLIC PANEL
ALT: 14 U/L (ref 0–44)
AST: 18 U/L (ref 15–41)
Albumin: 2.7 g/dL — ABNORMAL LOW (ref 3.5–5.0)
Alkaline Phosphatase: 61 U/L (ref 38–126)
Anion gap: 25 — ABNORMAL HIGH (ref 5–15)
BUN: 54 mg/dL — ABNORMAL HIGH (ref 8–23)
CO2: 29 mmol/L (ref 22–32)
Calcium: 7.5 mg/dL — ABNORMAL LOW (ref 8.9–10.3)
Chloride: 80 mmol/L — ABNORMAL LOW (ref 98–111)
Creatinine, Ser: 1.83 mg/dL — ABNORMAL HIGH (ref 0.44–1.00)
GFR calc Af Amer: 31 mL/min — ABNORMAL LOW (ref >60)
GFR calc non Af Amer: 26 mL/min — ABNORMAL LOW (ref >60)
Glucose, Bld: 261 mg/dL — ABNORMAL HIGH (ref 70–99)
Potassium: 3 mmol/L — ABNORMAL LOW (ref 3.5–5.1)
Sodium: 134 mmol/L — ABNORMAL LOW (ref 135–145)
Total Bilirubin: 1 mg/dL (ref 0.3–1.2)
Total Protein: 6.2 g/dL — ABNORMAL LOW (ref 6.5–8.1)

## 2018-08-18 LAB — CBC WITH DIFFERENTIAL/PLATELET
Abs Immature Granulocytes: 0.19 10*3/uL — ABNORMAL HIGH (ref 0.00–0.07)
Basophils Absolute: 0.1 10*3/uL (ref 0.0–0.1)
Basophils Relative: 0 %
Eosinophils Absolute: 0 10*3/uL (ref 0.0–0.5)
Eosinophils Relative: 0 %
HCT: 32.8 % — ABNORMAL LOW (ref 36.0–46.0)
Hemoglobin: 11 g/dL — ABNORMAL LOW (ref 12.0–15.0)
Immature Granulocytes: 1 %
Lymphocytes Relative: 4 %
Lymphs Abs: 1 10*3/uL (ref 0.7–4.0)
MCH: 27.9 pg (ref 26.0–34.0)
MCHC: 33.5 g/dL (ref 30.0–36.0)
MCV: 83.2 fL (ref 80.0–100.0)
Monocytes Absolute: 0.9 10*3/uL (ref 0.1–1.0)
Monocytes Relative: 3 %
Neutro Abs: 23.8 10*3/uL — ABNORMAL HIGH (ref 1.7–7.7)
Neutrophils Relative %: 92 %
Platelets: 367 10*3/uL (ref 150–400)
RBC: 3.94 MIL/uL (ref 3.87–5.11)
RDW: 13.4 % (ref 11.5–15.5)
WBC: 25.9 10*3/uL — ABNORMAL HIGH (ref 4.0–10.5)
nRBC: 0 % (ref 0.0–0.2)

## 2018-08-18 LAB — LACTIC ACID, PLASMA
Lactic Acid, Venous: 2.9 mmol/L (ref 0.5–1.9)
Lactic Acid, Venous: 1.8 mmol/L (ref 0.5–1.9)

## 2018-08-18 LAB — TROPONIN I: Troponin I: 0.03 ng/mL (ref <0.03)

## 2018-08-18 LAB — LIPASE, BLOOD: Lipase: 22 U/L (ref 11–51)

## 2018-08-18 LAB — SARS CORONAVIRUS 2 BY RT PCR (HOSPITAL ORDER, PERFORMED IN ~~LOC~~ HOSPITAL LAB): SARS Coronavirus 2: NEGATIVE

## 2018-08-18 LAB — MAGNESIUM: Magnesium: 1.1 mg/dL — ABNORMAL LOW (ref 1.7–2.4)

## 2018-08-18 MED ORDER — ALPRAZOLAM 0.5 MG PO TABS
0.5000 mg | ORAL_TABLET | Freq: Three times a day (TID) | ORAL | Status: DC | PRN
  Administered 2018-08-19 – 2018-08-21 (×3): 0.5 mg via ORAL
  Filled 2018-08-18 (×3): qty 1

## 2018-08-18 MED ORDER — POTASSIUM CHLORIDE 10 MEQ/100ML IV SOLN
10.0000 meq | Freq: Once | INTRAVENOUS | Status: AC
  Administered 2018-08-18: 21:00:00 10 meq via INTRAVENOUS
  Filled 2018-08-18: qty 100

## 2018-08-18 MED ORDER — MAGNESIUM SULFATE 2 GM/50ML IV SOLN
2.0000 g | Freq: Once | INTRAVENOUS | Status: AC
  Administered 2018-08-18: 2 g via INTRAVENOUS
  Filled 2018-08-18: qty 50

## 2018-08-18 MED ORDER — SODIUM CHLORIDE 0.9 % IV SOLN
INTRAVENOUS | Status: DC
  Administered 2018-08-18: 19:00:00 via INTRAVENOUS

## 2018-08-18 MED ORDER — SODIUM CHLORIDE 0.9 % IV SOLN: 2.0000 g | Freq: Two times a day (BID) | INTRAVENOUS | Status: DC

## 2018-08-18 MED ORDER — METRONIDAZOLE IN NACL 5-0.79 MG/ML-% IV SOLN
500.0000 mg | Freq: Three times a day (TID) | INTRAVENOUS | Status: DC
  Administered 2018-08-18: 22:00:00 500 mg via INTRAVENOUS
  Filled 2018-08-18: qty 100

## 2018-08-18 MED ORDER — SODIUM CHLORIDE 0.9 % IV BOLUS
500.0000 mL | Freq: Once | INTRAVENOUS | Status: AC
  Administered 2018-08-18: 20:00:00 500 mL via INTRAVENOUS

## 2018-08-18 MED ORDER — PIPERACILLIN-TAZOBACTAM 3.375 G IVPB 30 MIN: 3.3750 g | Freq: Three times a day (TID) | INTRAVENOUS | Status: DC

## 2018-08-18 MED ORDER — ORAL CARE MOUTH RINSE
15.0000 mL | Freq: Two times a day (BID) | OROMUCOSAL | Status: DC
  Administered 2018-08-19 – 2018-08-22 (×5): 15 mL via OROMUCOSAL

## 2018-08-18 MED ORDER — NICOTINE 21 MG/24HR TD PT24
21.0000 mg | MEDICATED_PATCH | Freq: Every day | TRANSDERMAL | Status: DC | PRN
  Administered 2018-08-21: 20:00:00 21 mg via TRANSDERMAL
  Filled 2018-08-18: qty 1

## 2018-08-18 MED ORDER — ACETAMINOPHEN 650 MG RE SUPP: 650.0000 mg | Freq: Four times a day (QID) | RECTAL | Status: DC | PRN

## 2018-08-18 MED ORDER — SODIUM CHLORIDE 0.9 % IV BOLUS
500.0000 mL | Freq: Once | INTRAVENOUS | Status: AC
  Administered 2018-08-18: 22:00:00 500 mL via INTRAVENOUS

## 2018-08-18 MED ORDER — MIDODRINE HCL 5 MG PO TABS
5.0000 mg | ORAL_TABLET | Freq: Three times a day (TID) | ORAL | Status: DC
  Administered 2018-08-18 – 2018-08-22 (×10): 5 mg via ORAL
  Filled 2018-08-18 (×10): qty 1

## 2018-08-18 MED ORDER — SODIUM CHLORIDE 0.9 % IV SOLN
2.0000 g | Freq: Once | INTRAVENOUS | Status: AC
  Administered 2018-08-18: 22:00:00 2 g via INTRAVENOUS
  Filled 2018-08-18: qty 2

## 2018-08-18 MED ORDER — POTASSIUM CHLORIDE IN NACL 40-0.9 MEQ/L-% IV SOLN
INTRAVENOUS | Status: DC
  Administered 2018-08-18: 22:00:00 250 mL/h via INTRAVENOUS
  Filled 2018-08-18: qty 1000

## 2018-08-18 MED ORDER — PROCHLORPERAZINE EDISYLATE 10 MG/2ML IJ SOLN
10.0000 mg | INTRAMUSCULAR | Status: DC | PRN
  Administered 2018-08-19: 22:00:00 10 mg via INTRAVENOUS
  Filled 2018-08-18: qty 2

## 2018-08-18 MED ORDER — ACETAMINOPHEN 325 MG PO TABS
650.0000 mg | ORAL_TABLET | Freq: Four times a day (QID) | ORAL | Status: DC | PRN
  Administered 2018-08-21: 07:00:00 650 mg via ORAL
  Filled 2018-08-18: qty 2

## 2018-08-18 MED ORDER — CHLORHEXIDINE GLUCONATE CLOTH 2 % EX PADS
6.0000 | MEDICATED_PAD | Freq: Every day | CUTANEOUS | Status: DC
  Administered 2018-08-18 – 2018-08-21 (×4): 6 via TOPICAL

## 2018-08-18 MED ORDER — FAMOTIDINE IN NACL 20-0.9 MG/50ML-% IV SOLN
20.0000 mg | Freq: Once | INTRAVENOUS | Status: DC
  Filled 2018-08-18: qty 50

## 2018-08-18 MED ORDER — PIPERACILLIN-TAZOBACTAM 3.375 G IVPB
3.3750 g | Freq: Three times a day (TID) | INTRAVENOUS | Status: DC
  Administered 2018-08-19 – 2018-08-21 (×7): 3.375 g via INTRAVENOUS
  Filled 2018-08-18 (×7): qty 50

## 2018-08-18 NOTE — ED Notes (Signed)
Missed IV to L thumb  Jacqueline Hansen in to attempt IV with ultrasound

## 2018-08-18 NOTE — ED Notes (Signed)
Pulse rate increase noted after bolus of NS  Dr Jamey Ripa apprised   She has evaluated pt

## 2018-08-18 NOTE — ED Notes (Signed)
Daughter called and reported that her brother is incarcerated and she felt that a three way call was needed   Daughter is informed that her mother's care is ongoing and perhaps since she insisted on being the family reporter, that it would be better if the call happened if her mother was admitted to the floor and not when she is being actively assessed as she is in [place] currently

## 2018-08-18 NOTE — ED Notes (Signed)
Call to floor for report   Velna Hatchet, RN will call back

## 2018-08-18 NOTE — ED Notes (Signed)
Call from daughter   She reports pt had a stroke as a result of constipation and straining "had a really hard BM" earlier in the week followed by D and more recently bleeding   I&O cath with small amount of raspberry stool to brief- Guiac positive   Spec to lab

## 2018-08-18 NOTE — ED Notes (Signed)
Pt seen per her report at [facility] this week for complaint of D/N  Told to take immodium also dx'd with a stroke  Seen at the [facility] clinic for D   Here today for D/N- States daughter felt she had blood in her stools yesterday but did not call anyone or seek care   Today here for N and vomiting

## 2018-08-18 NOTE — ED Triage Notes (Signed)
Pt having diarrhea for x1week. Per EMS vomit looked brownish. Per pt's daughter looked like possible blood in stool yesterday.

## 2018-08-18 NOTE — ED Notes (Signed)
Dr Olevia Bowens at bedside VO to increase rate of NS

## 2018-08-18 NOTE — ED Provider Notes (Signed)
[facility] EMERGENCY DEPARTMENT Provider Note   CSN: [id number] Arrival date & time: 08/18/18  1704     History   Chief Complaint Chief Complaint  Patient presents with   Abdominal Pain    HPI Jacqueline Hansen is a 78 y.o. female.     HPI  Pt was seen at 1715. Per pt, c/o gradual onset and persistence of multiple intermittent episodes of N/V/D for the past 1 week. Has been associated with left sided abd "pains," described as "aching."  Describes the stools as "watery." Pt's daughter told EMS that pt "might have had blood" in her stools once yesterday. Pt also states for the past 3 to 4 days she has had "burning" when she urinates. Denies fevers, no cough, CP/SOB, no back pain, no black stools or emesis.    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[place])    On home oxygen therapy    "2L ususally at night time" (09/08/2014)   Pulmonary embolism ([place])    "when I had my gallbladder taken out"   Type II diabetes mellitus [facility])     Patient Active Problem List   Diagnosis Date Noted   Cellulitis in diabetic foot ([place]) 08/23/2016   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/23/2016   Tobacco abuse 08/23/2016   SOB (shortness of breath) 08/12/2016   COPD with acute exacerbation ([place]) 08/12/2016   Cellulitis and abscess of foot    Cellulitis 08/06/2016   Benign neoplasm of sigmoid colon    Rectal ulceration    GI bleed 09/07/2014   Colitis 09/07/2014   COPD  (chronic obstructive pulmonary disease) ([place]) 09/07/2014   DM (diabetes mellitus) ([place]) 09/07/2014   PAD (peripheral artery disease) ([place]) 09/07/2014   Urinary retention 09/07/2014    Past Surgical History:  Procedure Laterality Date   ABDOMINAL HYSTERECTOMY     CARPAL TUNNEL RELEASE Bilateral    CATARACT EXTRACTION W/ INTRAOCULAR LENS  IMPLANT, BILATERAL Bilateral    COLONOSCOPY N/A 09/09/2014   Procedure: COLONOSCOPY;  Surgeon: Irene Shipper, MD;  Location: [place];  Service: Endoscopy;  Laterality: N/A;   EXCISIONAL HEMORRHOIDECTOMY     INCISION AND DRAINAGE ABSCESS Right 08/09/2016   Procedure: INCISION AND DRAINAGE ABSCESS;  Surgeon: Aviva Signs, MD;  Location: [facility];  Service: General;  Laterality: Right;   LAPAROSCOPIC CHOLECYSTECTOMY     MELANOMA EXCISION     "off my nose"   TUBAL LIGATION       OB History   No obstetric history on file.      Home Medications    Prior to Admission medications   Medication Sig Start Date End Date Taking? Authorizing Provider  albuterol (PROVENTIL HFA;VENTOLIN HFA) 108 (90 Base) MCG/ACT inhaler Inhale 1-2 puffs into the lungs every 6 (six) hours as needed for wheezing or shortness of breath.    [provider]  ALPRAZolam (XANAX) 0.5 MG tablet Take 0.5 mg by mouth 3 (three) times daily as needed for anxiety.     [provider]  aspirin EC 81 MG tablet Take 81 mg by mouth daily.    [provider]  clopidogrel (PLAVIX) 75 MG tablet Take 75 mg by mouth daily.    [provider]  DULoxetine (CYMBALTA) 60 MG capsule Take 60 mg by mouth daily.     [provider]  esomeprazole (NEXIUM) 40 MG capsule Take 40 mg by mouth daily at 12 noon.    [provider]  fluticasone (FLONASE) 50 MCG/ACT nasal spray Place 2 sprays into both nostrils daily.    [provider]  Fluticasone-Salmeterol (ADVAIR) 250-50 MCG/DOSE AEPB Inhale 1 puff into the lungs daily as needed.      [provider]  furosemide (LASIX) 20 MG tablet Take 1 tablet (20 mg total) by mouth daily. 08/14/16 08/14/17  Orvan Falconer, MD  HYDROcodone-acetaminophen (NORCO/VICODIN) 5-325 MG tablet Take 1 tablet by mouth every 6 (six) hours as needed for moderate pain. 08/26/16   Arrien, Jimmy Picket, MD  ibuprofen (ADVIL,MOTRIN) 400 MG tablet Take 2 tablets (800 mg total) by mouth every 8 (eight) hours as needed for mild pain or moderate pain. 08/26/16   Arrien, Jimmy Picket, MD  insulin aspart (NOVOLOG) 100 UNIT/ML injection Inject 0-10 Units into the skin 3 (three) times daily with meals. For glucose 150 to 200 use 2 units, 201 to 250 use 4 units, 251-300 use 6 units, 301-350 use 8 units, 351 or greater 10 units. 08/26/16   Arrien, Jimmy Picket, MD  insulin glargine (LANTUS) 100 UNIT/ML injection Inject 0.2 mLs (20 Units total) into the skin at bedtime. 08/11/16   Orvan Falconer, MD  ketoconazole (NIZORAL) 2 % cream Apply 1 application topically daily as needed for irritation (APPLIED TO FEET).  07/27/16   [provider]  linaclotide (LINZESS) 145 MCG CAPS capsule Take 145 mcg by mouth daily as needed (for constipation).    [provider]  loperamide (IMODIUM A-D) 2 MG tablet Take 2 mg by mouth 4 (four) times daily as needed for diarrhea or loose stools.    [provider]  meclizine (ANTIVERT) 12.5 MG tablet Take 12.5 mg by mouth 3 (three) times daily as needed for dizziness.    [provider]  metFORMIN (GLUCOPHAGE) 500 MG tablet Take 500 mg by mouth 2 (two) times daily with a meal.     [provider]  midodrine (PROAMATINE) 5 MG tablet Take 5 mg by mouth 3 (three) times daily.    [provider]  mometasone-formoterol (DULERA) 200-5 MCG/ACT AERO Inhale 2 puffs into the lungs 2 (two) times daily.    [provider]  potassium chloride (K-DUR) 10 MEQ tablet Take 1 tablet (10 mEq total) by mouth daily. 08/14/16   Orvan Falconer, MD  rosuvastatin  (CRESTOR) 40 MG tablet Take 40 mg by mouth at bedtime.     [provider]  sulfamethoxazole-trimethoprim (BACTRIM DS,SEPTRA DS) 800-160 MG tablet Take 1 tablet by mouth 2 (two) times daily. 09/27/16   Aviva Signs, MD  traMADol (ULTRAM) 50 MG tablet Take 1 tablet (50 mg total) by mouth every 12 (twelve) hours as needed. 09/13/16   Aviva Signs, MD    Family History History reviewed. No pertinent family history.  Social History Social History   Tobacco Use   Smoking status: Current Every Day Smoker    Packs/day: 1.00  Years: 32.00    Pack years: 32.00    Types: Cigarettes   Smokeless tobacco: Never Used  Substance Use Topics   Alcohol use: Not Currently    Comment: 09/08/2014 "I drank a little bit a long time ago"   Drug use: No     Allergies   Adhesive [tape]   Review of Systems Review of Systems ROS: Statement: All systems negative except as marked or noted in the HPI; Constitutional: Negative for fever and chills. ; ; Eyes: Negative for eye pain, redness and discharge. ; ; ENMT: Negative for ear pain, hoarseness, nasal congestion, sinus pressure and sore throat. ; ; Cardiovascular: Negative for chest pain, palpitations, diaphoresis, dyspnea and peripheral edema. ; ; Respiratory: Negative for cough, wheezing and stridor. ; ; Gastrointestinal: +N/V/D, abd pain, ?blood in stool yesterday. Negative for hematemesis, jaundice, black stools. . ; ; Genitourinary: +dysuria. Negative for flank pain and hematuria. ; ; Musculoskeletal: Negative for back pain and neck pain. Negative for swelling and trauma.; ; Skin: Negative for pruritus, rash, abrasions, blisters, bruising and skin lesion.; ; Neuro: Negative for headache, lightheadedness and neck stiffness. Negative for weakness, altered level of consciousness, altered mental status, extremity weakness, paresthesias, involuntary movement, seizure and syncope.       Physical Exam Updated Vital Signs BP 103/60 (BP Location:  Right Arm)    Pulse (!) 112    Temp (!) 97.5 F (36.4 C) (Oral)    Resp 18    Ht 5\' 4"  (1.626 m)    Wt 131.5 kg    SpO2 91%    BMI 49.78 kg/m   Patient Vitals for the past 24 hrs:  BP Temp Temp src Pulse Resp SpO2 Height Weight  08/18/18 2030 112/70 -- -- (!) 49 -- 100 % -- --  08/18/18 2000 110/60 -- -- (!) 110 -- 92 % -- --  08/18/18 1930 117/69 -- -- (!) 115 (!) 28 93 % -- --  08/18/18 1900 (!) 106/46 -- -- (!) 107 20 (!) 87 % -- --  08/18/18 1830 (!) 89/60 -- -- (!) 115 (!) 23 (!) 89 % -- --  08/18/18 1800 104/80 -- -- (!) 114 17 90 % -- --  08/18/18 1713 -- -- -- -- -- -- 5\' 4"  (1.626 m) 131.5 kg  08/18/18 1708 103/60 (!) 97.5 F (36.4 C) Oral (!) 112 18 91 % -- --      Physical Exam 1720: Physical examination:  Nursing notes reviewed; Vital signs and O2 SAT reviewed;  Constitutional: Well developed, Well nourished, In no acute distress; Head:  Normocephalic, atraumatic; Eyes: EOMI, PERRL, No scleral icterus; ENMT: Mouth and pharynx normal, Mucous membranes dry; Neck: Supple, Full range of motion, No lymphadenopathy; Cardiovascular: Tachycardic rate and rhythm, No gallop; Respiratory: Breath sounds clear & equal bilaterally, No wheezes.  Speaking full sentences with ease, Normal respiratory effort/excursion; Chest: Nontender, Movement normal; Abdomen: Soft, +left sided abd tenderness to palp. No rebound or guarding. Nondistended, Normal bowel sounds; Genitourinary: No CVA tenderness; Extremities: Peripheral pulses normal, No tenderness, No edema, No calf edema or asymmetry.; Neuro: AA&Ox3, tangential historian. No facial droop. Speech clear. No gross focal motor or sensory deficits in extremities.; Skin: Color normal, Warm, Dry.   ED Treatments / Results  Labs (all labs ordered are listed, but only abnormal results are displayed)   EKG EKG Interpretation  Date/Time:  Saturday August 18 2018 17:30:42 EDT  #1 Ventricular Rate:  110 PR Interval:    QRS Duration: 102 QT  Interval:  391 QTC Calculation: 529 R Axis:   18 Text Interpretation:  Sinus tachycardia Prolonged QT interval When compared with ECG of 08/12/2016 QT has lengthened Rate slower Confirmed by Francine Graven [phone]) on 08/18/2018 5:39:58 PM    EKG Interpretation  Date/Time:  Saturday August 18 2018 20:33:08 EDT   #2 Ventricular Rate:  138 PR Interval:    QRS Duration: 89 QT Interval:  357 QTC Calculation: 541 R Axis:   40 Text Interpretation:  Sinus or ectopic atrial tachycardia Low voltage, precordial leads Prolonged QT interval Artifact Since last tracing of earlier today No significant change was found Confirmed by Francine Graven [phone]) on 08/18/2018 8:54:50 PM         Radiology   Procedures Procedures (including critical care time)  Medications Ordered in ED Medications  0.9 %  sodium chloride infusion (has no administration in time range)     Initial Impression / Assessment and Plan / ED Course  I have reviewed the triage vital signs and the nursing notes.  Pertinent labs & imaging results that were available during my care of the patient were reviewed by me and considered in my medical decision making (see chart for details).     MDM Reviewed: previous chart, nursing note and vitals Reviewed previous: labs and ECG Interpretation: labs, ECG, x-ray and CT scan Total time providing critical care: 30-74 minutes. This excludes time spent performing separately reportable procedures and services. Consults: admitting MD    CRITICAL CARE Performed by: Francine Graven Total critical care time: 35 minutes Critical care time was exclusive of separately billable procedures and treating other patients. Critical care was necessary to treat or prevent imminent or life-threatening deterioration. Critical care was time spent personally by me on the following activities: development of treatment plan with patient and/or surrogate as well as nursing, discussions with  consultants, evaluation of patient's response to treatment, examination of patient, obtaining history from patient or surrogate, ordering and performing treatments and interventions, ordering and review of laboratory studies, ordering and review of radiographic studies, pulse oximetry and re-evaluation of patient's condition.   Results for orders placed or performed during the hospital encounter of 08/18/18  Comprehensive metabolic panel  Result Value Ref Range   Sodium 134 (L) 135 - 145 mmol/L   Potassium 3.0 (L) 3.5 - 5.1 mmol/L   Chloride 80 (L) 98 - 111 mmol/L   CO2 29 22 - 32 mmol/L   Glucose, Bld 261 (H) 70 - 99 mg/dL   BUN 54 (H) 8 - 23 mg/dL   Creatinine, Ser 1.83 (H) 0.44 - 1.00 mg/dL   Calcium 7.5 (L) 8.9 - 10.3 mg/dL   Total Protein 6.2 (L) 6.5 - 8.1 g/dL   Albumin 2.7 (L) 3.5 - 5.0 g/dL   AST 18 15 - 41 U/L   ALT 14 0 - 44 U/L   Alkaline Phosphatase 61 38 - 126 U/L   Total Bilirubin 1.0 0.3 - 1.2 mg/dL   GFR calc non Af Amer 26 (L) >60 mL/min   GFR calc Af Amer 31 (L) >60 mL/min   Anion gap 25 (H) 5 - 15  Lipase, blood  Result Value Ref Range   Lipase 22 11 - 51 U/L  Troponin I - Once  Result Value Ref Range   Troponin I 0.03 (HH) <0.03 ng/mL  CBC with Differential  Result Value Ref Range   WBC 25.9 (H) 4.0 - 10.5 K/uL   RBC 3.94 3.87 - 5.11 MIL/uL  Hemoglobin 11.0 (L) 12.0 - 15.0 g/dL   HCT 32.8 (L) 36.0 - 46.0 %   MCV 83.2 80.0 - 100.0 fL   MCH 27.9 26.0 - 34.0 pg   MCHC 33.5 30.0 - 36.0 g/dL   RDW 13.4 11.5 - 15.5 %   Platelets 367 150 - 400 K/uL   nRBC 0.0 0.0 - 0.2 %   Neutrophils Relative % 92 %   Neutro Abs 23.8 (H) 1.7 - 7.7 K/uL   Lymphocytes Relative 4 %   Lymphs Abs 1.0 0.7 - 4.0 K/uL   Monocytes Relative 3 %   Monocytes Absolute 0.9 0.1 - 1.0 K/uL   Eosinophils Relative 0 %   Eosinophils Absolute 0.0 0.0 - 0.5 K/uL   Basophils Relative 0 %   Basophils Absolute 0.1 0.0 - 0.1 K/uL   Immature Granulocytes 1 %   Abs Immature Granulocytes 0.19  (H) 0.00 - 0.07 K/uL  Lactic acid, plasma  Result Value Ref Range   Lactic Acid, Venous 2.9 (HH) 0.5 - 1.9 mmol/L  Urinalysis, Routine w reflex microscopic  Result Value Ref Range   Color, Urine YELLOW YELLOW   APPearance HAZY (A) CLEAR   Specific Gravity, Urine 1.015 1.005 - 1.030   pH 5.0 5.0 - 8.0   Glucose, UA NEGATIVE NEGATIVE mg/dL   Hgb urine dipstick NEGATIVE NEGATIVE   Bilirubin Urine NEGATIVE NEGATIVE   Ketones, ur NEGATIVE NEGATIVE mg/dL   Protein, ur NEGATIVE NEGATIVE mg/dL   Nitrite NEGATIVE NEGATIVE   Leukocytes,Ua NEGATIVE NEGATIVE  Magnesium  Result Value Ref Range   Magnesium 1.1 (L) 1.7 - 2.4 mg/dL   Ct Abdomen Pelvis Wo Contrast Result Date: 08/18/2018 CLINICAL DATA:  Diarrhea. EXAM: CT ABDOMEN AND PELVIS WITHOUT CONTRAST TECHNIQUE: Multidetector CT imaging of the abdomen and pelvis was performed following the standard protocol without IV contrast. COMPARISON:  None. FINDINGS: Lower chest: The heart size is normal. There are mitral valve calcifications. There are coronary artery calcifications. Hepatobiliary: No focal liver abnormality is seen. Status post cholecystectomy. No biliary dilatation. Pancreas: Unremarkable. No pancreatic ductal dilatation or surrounding inflammatory changes. Spleen: Normal in size without focal abnormality. 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 There is no hydronephrosis. There are no radiopaque obstructing kidney stones. The bladder is decompressed which limits evaluation. Stomach/Bowel: There is scattered colonic diverticula without CT evidence of diverticulitis. There is a probable intraluminal lipoma within the descending colon. The appendix is not reliably identified, however there are no significant inflammatory changes in the right lower quadrant. The stomach is unremarkable. A duodenal diverticulum is noted. There is some fluid within the partially visualized esophagus. There is some esophageal wall  thickening involving the distal esophagus. Vascular/Lymphatic: Aortic atherosclerosis. No enlarged abdominal or pelvic lymph nodes. Reproductive: Status post hysterectomy. No adnexal masses. Other: There is a fat containing periumbilical hernia. Musculoskeletal: There are degenerative changes throughout the visualized thoracolumbar spine without evidence of a fracture. IMPRESSION: 1. The partially visualized esophagus is fluid filled with associated wall thickening. Findings can be seen in patients with esophagitis. 2. Scattered colonic diverticula without CT evidence of diverticulitis. 3. Status post cholecystectomy. The appendix is not reliably identified, however there are no significant inflammatory changes in the right lower quadrant. Aortic Atherosclerosis (ICD10-I70.0). Electronically Signed   By: Constance Holster M.D.   On: 08/18/2018 20:09   Dg Chest Port 1 View Result Date: 08/18/2018 CLINICAL DATA:  Nausea, vomiting, and diarrhea. EXAM: PORTABLE CHEST 1 VIEW COMPARISON:  August 12, 2016 FINDINGS: There is a skin fold over the upper right chest with lung markings on both sides. No pneumothorax. The cardiomediastinal silhouette is stable. No pulmonary nodules, masses, or focal infiltrates. IMPRESSION: No active disease. Electronically Signed   By: Dorise Bullion III M.D   On: 08/18/2018 17:50    Jacqueline Hansen was evaluated in Emergency Department on 08/18/2018 for the symptoms described in the history of present illness. She was evaluated in the context of the global COVID-19 pandemic, which necessitated consideration that the patient might be at risk for infection with the SARS-CoV-2 virus that causes COVID-19. Institutional protocols and algorithms that pertain to the evaluation of patients at risk for COVID-19 are in a state of rapid change based on information released by regulatory bodies including the CDC and federal and state organizations. These policies and algorithms were followed during the  patient's care in the ED.    2030:  Small amount of maroon stool in pt's adult brief is heme positive. Pt has otherwise not had N/V or stooling while in the ED. Judicious IVF given for soft BP, tachycardia and new AKI with slow improvement in BP and HR. Pt's usual O2 2L N/C applied by myself with Sats improving to 100%, HR 110's. CBC with leukocytosis; no clear source of infection on workup today. Lactic acid has improved after IVF and pt remains afebrile. BC and UC pending.  Magnesium and potassium repleted IV. CT without acute bowel process, IV pepcid given for esophagitis seen on CT scan. Will admit. T/C returned from [facility]. Olevia Bowens, case discussed, including:  HPI, pertinent PM/SHx, VS/PE, dx testing, ED course and treatment:  Agreeable to admit.           Final Clinical Impressions(s) / ED Diagnoses   Final diagnoses:  None    ED Discharge Orders    None       Francine Graven, DO 08/23/18 [id number]

## 2018-08-18 NOTE — H&P (Signed)
History and Physical    Chloie Loney INO:[id number] DOB: 05/30/1940 DOA: 08/18/2018  PCP: Keane Police, MD   Patient coming from: Home.  I have personally briefly reviewed patient's old medical records in [place]  Chief Complaint: Diarrhea.  HPI: Tacey Dimaggio is a 78 y.o. female with medical history significant of anxiety, depression, osteoarthritis, asthma, diastolic heart failure, recurrent episodes of bronchitis, COPD, fibromyalgia, gastric ulcer, GERD, hiatal hernia, grand mal seizure, hyperlipidemia, melanoma of nose, history of pulmonary embolism, type 2 diabetes, obesity who is coming to the emergency department with complaints of having a week of progressively worse diarrhea, epigastric discomfort, nausea, some episodes of emesis, decreased appetite, lightheadedness, fatigue and weakness, but denies fever, chills, sore throat, rhinorrhea, wheezing or hemoptysis.  She has a chronic cough gets occasional dyspnea.  She denies sick contacts or travel history.  No chest pain, palpitations, diaphoresis, PND, orthopnea or recent pitting edema of the lower extremities.  She denies dysuria, frequency or hematuria.No polyuria, polydipsia, polyphagia or blurred vision.  After talking to her daughter, patient has been taking all of her medications, including furosemide (which worsened dehydration), amlodipine and metformin.  ED Course: The patient was given 1000 mL of NS , Magnesium and potassium replacement.had positive guaiac stools.Her urinalysis was hazy in appearance, but otherwise normal.    CBC showed a white count of 25.9 with 92% neutrophils, hemoglobin 11.0 g/dL and platelets 367.  Initial lactic acid was 2.9 and follow-up was 1.8 mmol/L.  Sodium 134, potassium 3.0, chloride 80, CO2 29 mmol/L.  Magnesium 1.1, calcium 7.5, glucose 261, BUN 54 and creatinine 1.83 mg/dL baseline creatinine from 2 years ago ranged from 0.6-0.65.  EKG showed sinus tachycardia with prolonged QT interval  and no acute changes.  Troponin 0 0.03 ng/mL.  CT abdomen showed diverticulosis and  Review of Systems: As per HPI otherwise 10 point review of systems negative.   Past Medical History:  Diagnosis Date  . Anxiety   . Arthritis    "joints" (09/08/2014)  . Asthma   . CHF (congestive heart failure) ([place])   . Chronic bronchitis ([place])    "get it q yr"  . COPD (chronic obstructive pulmonary disease) ([place])   . Depression   . Fibromyalgia   . Gastric ulcer   . GERD (gastroesophageal reflux disease)   . Grand mal seizure [facility])    "last one was in the 1970's" (09/08/2014)  . History of blood transfusion ?2015   "cause I was throwing it up"  . History of hiatal hernia   . Hypercholesterolemia   . 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[place])   . On home oxygen therapy    "2L ususally at night time" (09/08/2014)  . Pulmonary embolism ([place])    "when I had my gallbladder taken out"  . Type II diabetes mellitus ([place])     Past Surgical History:  Procedure Laterality Date  . ABDOMINAL HYSTERECTOMY    . CARPAL TUNNEL RELEASE Bilateral   . CATARACT EXTRACTION W/ INTRAOCULAR LENS  IMPLANT, BILATERAL Bilateral   . COLONOSCOPY N/A 09/09/2014   Procedure: COLONOSCOPY;  Surgeon: Irene Shipper, MD;  Location: [facility];  Service: Endoscopy;  Laterality: N/A;  . EXCISIONAL HEMORRHOIDECTOMY    . INCISION AND DRAINAGE ABSCESS Right 08/09/2016   Procedure: INCISION AND DRAINAGE ABSCESS;  Surgeon: Aviva Signs, MD;  Location: [facility];  Service: General;  Laterality: Right;  . LAPAROSCOPIC CHOLECYSTECTOMY    . MELANOMA EXCISION     "off my nose"  .  TUBAL LIGATION       reports that she has been smoking cigarettes. She has a 32.00 pack-year smoking history. She has never used smokeless tobacco. She reports previous alcohol use. She reports that she does not use drugs.  Allergies  Allergen Reactions  . Adhesive [Tape] Other (See Comments)    Reaction:  Tears pts skin    Family History  Problem Relation Age of Onset  .  Heart disease Mother   . AAA (abdominal aortic aneurysm) Mother        Died of aneurysm  . Diabetes Sister   . Lung cancer Sister    Prior to Admission medications   Medication Sig Start Date End Date Taking? Authorizing Provider  albuterol (PROVENTIL HFA;VENTOLIN HFA) 108 (90 Base) MCG/ACT inhaler Inhale 1-2 puffs into the lungs every 6 (six) hours as needed for wheezing or shortness of breath.   Yes [provider]  ALPRAZolam Duanne Moron) 0.5 MG tablet Take 0.5 mg by mouth 3 (three) times daily as needed for anxiety.    Yes [provider]  amLODipine (NORVASC) 5 MG tablet Take 1 tablet by mouth daily. 07/19/18  Yes [provider]  aspirin EC 81 MG tablet Take 81 mg by mouth daily.   Yes [provider]  clopidogrel (PLAVIX) 75 MG tablet Take 75 mg by mouth daily.   Yes [provider]  DULoxetine (CYMBALTA) 60 MG capsule Take 60 mg by mouth daily.    Yes [provider]  esomeprazole (NEXIUM) 40 MG capsule Take 40 mg by mouth daily at 12 noon.   Yes [provider]  fluticasone (FLONASE) 50 MCG/ACT nasal spray Place 2 sprays into both nostrils daily.   Yes [provider]  Fluticasone-Salmeterol (ADVAIR) 250-50 MCG/DOSE AEPB Inhale 1 puff into the lungs daily as needed.    Yes [provider]  HYDROcodone-acetaminophen (NORCO/VICODIN) 5-325 MG tablet Take 1 tablet by mouth every 6 (six) hours as needed for moderate pain. 08/26/16  Yes Arrien, Jimmy Picket, MD  ibuprofen (ADVIL,MOTRIN) 400 MG tablet Take 2 tablets (800 mg total) by mouth every 8 (eight) hours as needed for mild pain or moderate pain. 08/26/16  Yes Arrien, Jimmy Picket, MD  insulin aspart (NOVOLOG) 100 UNIT/ML injection Inject 0-10 Units into the skin 3 (three) times daily with meals. For glucose 150 to 200 use 2 units, 201 to 250 use 4 units, 251-300 use 6 units, 301-350 use 8 units, 351 or greater 10 units. 08/26/16  Yes Arrien, Jimmy Picket, MD   insulin glargine (LANTUS) 100 UNIT/ML injection Inject 0.2 mLs (20 Units total) into the skin at bedtime. 08/11/16  Yes Orvan Falconer, MD  ketoconazole (NIZORAL) 2 % cream Apply 1 application topically daily as needed for irritation (APPLIED TO FEET).  07/27/16  Yes [provider]  linaclotide (LINZESS) 145 MCG CAPS capsule Take 145 mcg by mouth daily as needed (for constipation).   Yes [provider]  loperamide (IMODIUM A-D) 2 MG tablet Take 2 mg by mouth 4 (four) times daily as needed for diarrhea or loose stools.   Yes [provider]  meclizine (ANTIVERT) 12.5 MG tablet Take 12.5 mg by mouth 3 (three) times daily as needed for dizziness.   Yes [provider]  metFORMIN (GLUCOPHAGE) 500 MG tablet Take 500 mg by mouth 2 (two) times daily with a meal.    Yes [provider]  midodrine (PROAMATINE) 5 MG tablet Take 5 mg by mouth 3 (three) times daily.  Yes [provider]  mometasone-formoterol (DULERA) 200-5 MCG/ACT AERO Inhale 2 puffs into the lungs 2 (two) times daily.   Yes [provider]  potassium chloride (K-DUR) 10 MEQ tablet Take 1 tablet (10 mEq total) by mouth daily. 08/14/16  Yes Orvan Falconer, MD  rosuvastatin (CRESTOR) 40 MG tablet Take 40 mg by mouth at bedtime.    Yes [provider]  sulfamethoxazole-trimethoprim (BACTRIM DS,SEPTRA DS) 800-160 MG tablet Take 1 tablet by mouth 2 (two) times daily. 09/27/16  Yes Aviva Signs, MD  traMADol (ULTRAM) 50 MG tablet Take 1 tablet (50 mg total) by mouth every 12 (twelve) hours as needed. 09/13/16  Yes Aviva Signs, MD  furosemide (LASIX) 20 MG tablet Take 1 tablet (20 mg total) by mouth daily. 08/14/16 08/14/17  Orvan Falconer, MD    Physical Exam: Vitals:   08/18/18 1800 08/18/18 1830 08/18/18 1900 08/18/18 1930  BP: 104/80 (!) 89/60 (!) 106/46 117/69  Pulse: (!) 114 (!) 115 (!) 107 (!) 115  Resp: 17 (!) 23 20 (!) 28  Temp:      TempSrc:      SpO2: 90% (!) 89% (!) 87% 93%   Weight:      Height:        Constitutional: Looks acutely ill. Eyes: PERRL, lids and conjunctivae normal ENMT: Mucous membranes are dry. Posterior pharynx clear of any exudate or lesions. Neck: normal, supple, no masses, no thyromegaly Respiratory: Decreased breath sounds in bases, otherwise clear to auscultation bilaterally, no wheezing, no crackles. Normal respiratory effort. No accessory muscle use.  Cardiovascular: Tachycardic in the 130s, no murmurs / rubs / gallops. No extremity edema. 2+ pedal pulses. No carotid bruits.  Abdomen: Soft, mild epigastric tenderness, no masses palpated. No hepatosplenomegaly. Bowel sounds positive.  Musculoskeletal: no clubbing / cyanosis. Good ROM, no contractures. Normal muscle tone.  Skin: no rashes, lesions, ulcers on very limited dermatological examination. Neurologic: CN 2-12 grossly intact. Sensation intact, DTR normal.  Generalized nonfocal weakness. Psychiatric:  Alert and oriented x 2, partially oriented to time and date.   Labs on Admission: I have personally reviewed following labs and imaging studies  CBC: Recent Labs  Lab 08/18/18 1806  WBC 25.9*  NEUTROABS 23.8*  HGB 11.0*  HCT 32.8*  MCV 83.2  PLT 824   Basic Metabolic Panel: Recent Labs  Lab 08/18/18 1806 08/18/18 1807  NA 134*  --   K 3.0*  --   CL 80*  --   CO2 29  --   GLUCOSE 261*  --   BUN 54*  --   CREATININE 1.83*  --   CALCIUM 7.5*  --   MG  --  1.1*   GFR: Estimated Creatinine Clearance: 35.3 mL/min (A) (by C-G formula based on SCr of 1.83 mg/dL (H)). Liver Function Tests: Recent Labs  Lab 08/18/18 1806  AST 18  ALT 14  ALKPHOS 61  BILITOT 1.0  PROT 6.2*  ALBUMIN 2.7*   Recent Labs  Lab 08/18/18 1806  LIPASE 22   No results for input(s): AMMONIA in the last 168 hours. Coagulation Profile: No results for input(s): INR, PROTIME in the last 168 hours. Cardiac Enzymes: Recent Labs  Lab 08/18/18 1806  TROPONINI 0.03*   BNP (last 3  results) No results for input(s): PROBNP in the last 8760 hours. HbA1C: No results for input(s): HGBA1C in the last 72 hours. CBG: No results for input(s): GLUCAP in the last 168 hours. Lipid Profile: No results for input(s): CHOL, HDL,  LDLCALC, TRIG, CHOLHDL, LDLDIRECT in the last 72 hours. Thyroid Function Tests: No results for input(s): TSH, T4TOTAL, FREET4, T3FREE, THYROIDAB in the last 72 hours. Anemia Panel: No results for input(s): VITAMINB12, FOLATE, FERRITIN, TIBC, IRON, RETICCTPCT in the last 72 hours. Urine analysis:    Component Value Date/Time   COLORURINE YELLOW 08/18/2018 1840   APPEARANCEUR HAZY (A) 08/18/2018 1840   LABSPEC 1.015 08/18/2018 1840   PHURINE 5.0 08/18/2018 1840   GLUCOSEU NEGATIVE 08/18/2018 1840   HGBUR NEGATIVE 08/18/2018 1840   BILIRUBINUR NEGATIVE 08/18/2018 1840   KETONESUR NEGATIVE 08/18/2018 1840   PROTEINUR NEGATIVE 08/18/2018 1840   NITRITE NEGATIVE 08/18/2018 1840   LEUKOCYTESUR NEGATIVE 08/18/2018 1840    Radiological Exams on Admission: Ct Abdomen Pelvis Wo Contrast  Result Date: 08/18/2018 CLINICAL DATA:  Diarrhea. EXAM: CT ABDOMEN AND PELVIS WITHOUT CONTRAST TECHNIQUE: Multidetector CT imaging of the abdomen and pelvis was performed following the standard protocol without IV contrast. COMPARISON:  None. FINDINGS: Lower chest: The heart size is normal. There are mitral valve calcifications. There are coronary artery calcifications. Hepatobiliary: No focal liver abnormality is seen. Status post cholecystectomy. No biliary dilatation. Pancreas: Unremarkable. No pancreatic ductal dilatation or surrounding inflammatory changes. Spleen: Normal in size without focal abnormality. 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 There is no hydronephrosis. There are no radiopaque obstructing kidney stones. The bladder is decompressed which limits evaluation. Stomach/Bowel: There is scattered colonic diverticula without CT  evidence of diverticulitis. There is a probable intraluminal lipoma within the descending colon. The appendix is not reliably identified, however there are no significant inflammatory changes in the right lower quadrant. The stomach is unremarkable. A duodenal diverticulum is noted. There is some fluid within the partially visualized esophagus. There is some esophageal wall thickening involving the distal esophagus. Vascular/Lymphatic: Aortic atherosclerosis. No enlarged abdominal or pelvic lymph nodes. Reproductive: Status post hysterectomy. No adnexal masses. Other: There is a fat containing periumbilical hernia. Musculoskeletal: There are degenerative changes throughout the visualized thoracolumbar spine without evidence of a fracture. IMPRESSION: 1. The partially visualized esophagus is fluid filled with associated wall thickening. Findings can be seen in patients with esophagitis. 2. Scattered colonic diverticula without CT evidence of diverticulitis. 3. Status post cholecystectomy. The appendix is not reliably identified, however there are no significant inflammatory changes in the right lower quadrant. Aortic Atherosclerosis (ICD10-I70.0). Electronically Signed   By: Constance Holster M.D.   On: 08/18/2018 20:09   Dg Chest Port 1 View  Result Date: 08/18/2018 CLINICAL DATA:  Nausea, vomiting, and diarrhea. EXAM: PORTABLE CHEST 1 VIEW COMPARISON:  August 12, 2016 FINDINGS: There is a skin fold over the upper right chest with lung markings on both sides. No pneumothorax. The cardiomediastinal silhouette is stable. No pulmonary nodules, masses, or focal infiltrates. IMPRESSION: No active disease. Electronically Signed   By: Dorise Bullion III M.D   On: 08/18/2018 17:50    EKG: Independently reviewed. EKG #1 Vent. rate 110 BPM PR interval * ms QRS duration 102 ms QT/QTc 391/529 ms P-R-T axes * 18 14 Sinus tachycardia Prolonged QT interval  EKG #2 Vent. rate 138 BPM PR interval * ms QRS duration  89 ms QT/QTc 357/541 ms P-R-T axes 190 40 21 Sinus or ectopic atrial tachycardia Low voltage, precordial leads Prolonged QT interval Artifact  Assessment/Plan Principal Problem:   Sepsis due to undetermined organism ([place]) Admit to stepdown/inpatient. Continue supplemental oxygen. Continue IV fluids. Monitor intake and output. Started on cefepime/metronidazole, but QT prolongation  persists. Flagyl was stopped and cefepime discontinued.  Zosyn started. Follow CBC CMP in a.m. Follow-up blood cultures and sensitivity.  Active Problems:   AKI (acute kidney injury) ([place]) Continue IV fluids. Monitor intake and output. Follow-up renal function electrolytes in a.m. Monitor renal function closely while on Zosyn.    Hypomagnesemia Replacement given. Follow-up level in the morning.    Hypokalemia Replacing, magnesium has been supplemented.    Prolonged QT interval After magnesium, QT interval prolonged more. Discontinued cefepime/metronidazole and start Zosyn. Monitor renal function closely while on Zosyn.    Anemia Likely due to recent blood loss. Expect H&H to decrease as she gets rehydrated. Check type and screen on next blood draw.    COPD (chronic obstructive pulmonary disease) ([facility]) Supplemental oxygen and bronchodilators as needed.    Type II diabetes mellitus ([facility]) Currently on clear liquids. Continue IV fluids. Hold metformin due to AKI. CBG monitoring with regular insulin sliding scale.    PAD (peripheral artery disease) ([facility]) Holding aspirin and statin for now.    Tobacco abuse Nicotine replacement therapy ordered..    Hypercholesterolemia Hold statin for a day or 2 until weakness and AKI improved.    GERD (gastroesophageal reflux disease) Continue PPI in a.m. if   DVT prophylaxis: SCDs. Code Status: Full code. Family Communication: I spoke in detail for about 20 minutes with the patient's daughter Ms. Tempie Donning.  She would like to be kept  updated daily. Disposition Plan: Admit to stepdown for sepsis, dehydration with AKI, electrolyte imbalance and tachycardia control for at least 2 to 3 days. Consults called: Routine gastroenterology consult. Admission status: Inpatient/stepdown.   Reubin Milan MD [facility]  08/18/2018, 8:40 PM   This document was prepared using Dragon voice recognition software and may contain some unintended transcription errors.

## 2018-08-18 NOTE — ED Notes (Signed)
Date and time results received: 08/18/18 6:39 PM  (use smartphrase ".now" to insert current time)  Test: Lactic Critical Value: 2.9  Name of Provider Notified: Thurnell Garbe  Orders Received? Or Actions Taken?: Orders Received - See Orders for details

## 2018-08-18 NOTE — ED Notes (Signed)
From CT meds started

## 2018-08-18 NOTE — ED Notes (Signed)
Pt in CT.

## 2018-08-18 NOTE — ED Notes (Signed)
Critical Lab:  Trop 0.03

## 2018-08-18 NOTE — Progress Notes (Signed)
Pharmacy Antibiotic Note  Jacqueline Hansen is a 78 y.o. female admitted on 08/18/2018 with intra abdominal infection.  Pharmacy has been consulted for cefepime dosing.  Plan: cefepime 2gm iv q12h  Height: 5\' 4"  (162.6 cm) Weight: 290 lb (131.5 kg) IBW/kg (Calculated) : 54.7  Temp (24hrs), Avg:97.5 F (36.4 C), Min:97.5 F (36.4 C), Max:97.5 F (36.4 C)  Recent Labs  Lab 08/18/18 1806 08/18/18 2008  WBC 25.9*  --   CREATININE 1.83*  --   LATICACIDVEN 2.9* 1.8    Estimated Creatinine Clearance: 35.3 mL/min (A) (by C-G formula based on SCr of 1.83 mg/dL (H)).    Allergies  Allergen Reactions  . Adhesive [Tape] Other (See Comments)    Reaction:  Tears pts skin     Antimicrobials this admission: 6/13 cefepime >>   Microbiology results: 6/13 BCx: sent 6/13 UCx: sent  6/13 Covid 19 negative   Thank you for allowing pharmacy to be a part of this patient's care.  Donna Christen Aseel [place] 08/18/2018 9:34 PM

## 2018-08-19 DIAGNOSIS — R112 Nausea with vomiting, unspecified: Secondary | ICD-10-CM

## 2018-08-19 DIAGNOSIS — N179 Acute kidney failure, unspecified: Secondary | ICD-10-CM

## 2018-08-19 DIAGNOSIS — K921 Melena: Secondary | ICD-10-CM | POA: Diagnosis present

## 2018-08-19 DIAGNOSIS — E876 Hypokalemia: Secondary | ICD-10-CM

## 2018-08-19 DIAGNOSIS — E7800 Pure hypercholesterolemia, unspecified: Secondary | ICD-10-CM

## 2018-08-19 DIAGNOSIS — K209 Esophagitis, unspecified without bleeding: Secondary | ICD-10-CM | POA: Diagnosis present

## 2018-08-19 DIAGNOSIS — K210 Gastro-esophageal reflux disease with esophagitis: Secondary | ICD-10-CM

## 2018-08-19 DIAGNOSIS — J42 Unspecified chronic bronchitis: Secondary | ICD-10-CM

## 2018-08-19 DIAGNOSIS — Z720 Tobacco use: Secondary | ICD-10-CM

## 2018-08-19 DIAGNOSIS — D72829 Elevated white blood cell count, unspecified: Secondary | ICD-10-CM

## 2018-08-19 DIAGNOSIS — K920 Hematemesis: Secondary | ICD-10-CM

## 2018-08-19 DIAGNOSIS — R195 Other fecal abnormalities: Secondary | ICD-10-CM

## 2018-08-19 DIAGNOSIS — I739 Peripheral vascular disease, unspecified: Secondary | ICD-10-CM

## 2018-08-19 DIAGNOSIS — R9431 Abnormal electrocardiogram [ECG] [EKG]: Secondary | ICD-10-CM

## 2018-08-19 LAB — CBC WITH DIFFERENTIAL/PLATELET
Abs Immature Granulocytes: 0.18 10*3/uL — ABNORMAL HIGH (ref 0.00–0.07)
Basophils Absolute: 0.1 10*3/uL (ref 0.0–0.1)
Basophils Relative: 0 %
Eosinophils Absolute: 0 10*3/uL (ref 0.0–0.5)
Eosinophils Relative: 0 %
HCT: 29.1 % — ABNORMAL LOW (ref 36.0–46.0)
Hemoglobin: 9.4 g/dL — ABNORMAL LOW (ref 12.0–15.0)
Immature Granulocytes: 1 %
Lymphocytes Relative: 10 %
Lymphs Abs: 2.4 10*3/uL (ref 0.7–4.0)
MCH: 28.3 pg (ref 26.0–34.0)
MCHC: 32.3 g/dL (ref 30.0–36.0)
MCV: 87.7 fL (ref 80.0–100.0)
Monocytes Absolute: 1.6 10*3/uL — ABNORMAL HIGH (ref 0.1–1.0)
Monocytes Relative: 6 %
Neutro Abs: 21.2 10*3/uL — ABNORMAL HIGH (ref 1.7–7.7)
Neutrophils Relative %: 83 %
Platelets: 267 10*3/uL (ref 150–400)
RBC: 3.32 MIL/uL — ABNORMAL LOW (ref 3.87–5.11)
RDW: 13.6 % (ref 11.5–15.5)
WBC: 25.4 10*3/uL — ABNORMAL HIGH (ref 4.0–10.5)
nRBC: 0 % (ref 0.0–0.2)

## 2018-08-19 LAB — COMPREHENSIVE METABOLIC PANEL
ALT: 12 U/L (ref 0–44)
AST: 16 U/L (ref 15–41)
Albumin: 2.4 g/dL — ABNORMAL LOW (ref 3.5–5.0)
Alkaline Phosphatase: 50 U/L (ref 38–126)
Anion gap: 16 — ABNORMAL HIGH (ref 5–15)
BUN: 66 mg/dL — ABNORMAL HIGH (ref 8–23)
CO2: 30 mmol/L (ref 22–32)
Calcium: 6.8 mg/dL — ABNORMAL LOW (ref 8.9–10.3)
Chloride: 88 mmol/L — ABNORMAL LOW (ref 98–111)
Creatinine, Ser: 1.51 mg/dL — ABNORMAL HIGH (ref 0.44–1.00)
GFR calc Af Amer: 39 mL/min — ABNORMAL LOW (ref >60)
GFR calc non Af Amer: 33 mL/min — ABNORMAL LOW (ref >60)
Glucose, Bld: 218 mg/dL — ABNORMAL HIGH (ref 70–99)
Potassium: 3.3 mmol/L — ABNORMAL LOW (ref 3.5–5.1)
Sodium: 134 mmol/L — ABNORMAL LOW (ref 135–145)
Total Bilirubin: 0.7 mg/dL (ref 0.3–1.2)
Total Protein: 5.3 g/dL — ABNORMAL LOW (ref 6.5–8.1)

## 2018-08-19 LAB — GLUCOSE, CAPILLARY
Glucose-Capillary: 213 mg/dL — ABNORMAL HIGH (ref 70–99)
Glucose-Capillary: 382 mg/dL — ABNORMAL HIGH (ref 70–99)
Glucose-Capillary: 60 mg/dL — ABNORMAL LOW (ref 70–99)
Glucose-Capillary: 101 mg/dL — ABNORMAL HIGH (ref 70–99)
Glucose-Capillary: 130 mg/dL — ABNORMAL HIGH (ref 70–99)

## 2018-08-19 LAB — MAGNESIUM: Magnesium: 2.5 mg/dL — ABNORMAL HIGH (ref 1.7–2.4)

## 2018-08-19 LAB — HEMOGLOBIN A1C
Hgb A1c MFr Bld: 7 % — ABNORMAL HIGH (ref 4.8–5.6)
Mean Plasma Glucose: 154.2 mg/dL

## 2018-08-19 LAB — MRSA PCR SCREENING: MRSA by PCR: NEGATIVE

## 2018-08-19 MED ORDER — ALBUTEROL SULFATE (2.5 MG/3ML) 0.083% IN NEBU: 3.0000 mL | INHALATION_SOLUTION | Freq: Four times a day (QID) | RESPIRATORY_TRACT | Status: DC | PRN

## 2018-08-19 MED ORDER — INSULIN ASPART 100 UNIT/ML ~~LOC~~ SOLN
4.0000 [IU] | Freq: Three times a day (TID) | SUBCUTANEOUS | Status: DC
  Administered 2018-08-19 (×2): 4 [IU] via SUBCUTANEOUS

## 2018-08-19 MED ORDER — ROSUVASTATIN CALCIUM 20 MG PO TABS
40.0000 mg | ORAL_TABLET | Freq: Every day | ORAL | Status: DC
  Administered 2018-08-19 – 2018-08-21 (×3): 40 mg via ORAL
  Filled 2018-08-19 (×3): qty 2

## 2018-08-19 MED ORDER — INSULIN GLARGINE 100 UNIT/ML ~~LOC~~ SOLN: 20.0000 [IU] | Freq: Every day | SUBCUTANEOUS | Status: DC

## 2018-08-19 MED ORDER — PANTOPRAZOLE SODIUM 40 MG PO TBEC: 40.0000 mg | DELAYED_RELEASE_TABLET | Freq: Every day | ORAL | Status: DC

## 2018-08-19 MED ORDER — DULOXETINE HCL 60 MG PO CPEP
60.0000 mg | ORAL_CAPSULE | Freq: Every day | ORAL | Status: DC
  Administered 2018-08-19 – 2018-08-22 (×4): 60 mg via ORAL
  Filled 2018-08-19 (×4): qty 1

## 2018-08-19 MED ORDER — INSULIN ASPART 100 UNIT/ML ~~LOC~~ SOLN
2.0000 [IU] | Freq: Three times a day (TID) | SUBCUTANEOUS | Status: DC
  Administered 2018-08-20 – 2018-08-22 (×5): 2 [IU] via SUBCUTANEOUS

## 2018-08-19 MED ORDER — POTASSIUM CHLORIDE CRYS ER 20 MEQ PO TBCR
30.0000 meq | EXTENDED_RELEASE_TABLET | Freq: Once | ORAL | Status: AC
  Administered 2018-08-19: 30 meq via ORAL
  Filled 2018-08-19: qty 1

## 2018-08-19 MED ORDER — INSULIN ASPART 100 UNIT/ML ~~LOC~~ SOLN: 0.0000 [IU] | Freq: Every day | SUBCUTANEOUS | Status: DC

## 2018-08-19 MED ORDER — INSULIN ASPART 100 UNIT/ML ~~LOC~~ SOLN
0.0000 [IU] | Freq: Three times a day (TID) | SUBCUTANEOUS | Status: DC
  Administered 2018-08-19: 12:00:00 15 [IU] via SUBCUTANEOUS
  Administered 2018-08-19: 09:00:00 5 [IU] via SUBCUTANEOUS

## 2018-08-19 MED ORDER — MOMETASONE FURO-FORMOTEROL FUM 200-5 MCG/ACT IN AERO
2.0000 | INHALATION_SPRAY | Freq: Two times a day (BID) | RESPIRATORY_TRACT | Status: DC
  Administered 2018-08-19 – 2018-08-22 (×6): 2 via RESPIRATORY_TRACT
  Filled 2018-08-19: qty 8.8

## 2018-08-19 MED ORDER — INSULIN ASPART 100 UNIT/ML ~~LOC~~ SOLN
0.0000 [IU] | Freq: Three times a day (TID) | SUBCUTANEOUS | Status: DC
  Administered 2018-08-20: 12:00:00 3 [IU] via SUBCUTANEOUS
  Administered 2018-08-20 – 2018-08-21 (×3): 1 [IU] via SUBCUTANEOUS
  Administered 2018-08-21: 08:00:00 2 [IU] via SUBCUTANEOUS
  Administered 2018-08-22: 1 [IU] via SUBCUTANEOUS

## 2018-08-19 MED ORDER — INSULIN GLARGINE 100 UNIT/ML ~~LOC~~ SOLN
15.0000 [IU] | Freq: Every day | SUBCUTANEOUS | Status: DC
  Filled 2018-08-19: qty 0.15

## 2018-08-19 MED ORDER — PANTOPRAZOLE SODIUM 40 MG IV SOLR
40.0000 mg | Freq: Two times a day (BID) | INTRAVENOUS | Status: DC
  Administered 2018-08-19 – 2018-08-22 (×7): 40 mg via INTRAVENOUS
  Filled 2018-08-19 (×7): qty 40

## 2018-08-19 MED ORDER — INSULIN GLARGINE 100 UNIT/ML ~~LOC~~ SOLN
12.0000 [IU] | Freq: Every day | SUBCUTANEOUS | Status: DC
  Administered 2018-08-19 – 2018-08-21 (×3): 12 [IU] via SUBCUTANEOUS
  Filled 2018-08-19 (×4): qty 0.12

## 2018-08-19 NOTE — Progress Notes (Signed)
PROGRESS NOTE    Jacqueline Hansen  JKD:[id number]  DOB: 1941/02/22  DOA: 08/18/2018 PCP: Keane Police, MD   Brief Admission Hx: 78 year old female with multiple comorbidities including COPD, GERD, epilepsy, history of PE, diabetes and obesity presented complaining of abdominal discomfort diarrhea nausea and emesis weakness and lightheadedness.  MDM/Assessment & Plan:   1. Acute esophagitis-patient says that her symptoms have progressed over the past 2 weeks.  She has had some difficulty with swallowing and emesis.  She has been started on IV Protonix.  She is on a clear liquid diet however she reports that she is having a difficult time keeping down the liquids.  She is also having diarrhea with heme positive stools.  Continue supportive care.  Have asked for GI consult.  Continue current treatments. 2. Hypomagnesemia- IV replacement ordered. 3. Hypokalemia- replacement ordered and for follow BMP. 4. AKI-likely prerenal and will treat with IV fluid hydration. 5. Leukocytosis- no clear source of infection has been found patient is being empirically treated with Zosyn pending further work-up, follow-up blood cultures.  Follow CBC with differential.  Lactic acid has normalized after IV fluid treatment. 6. COPD- continue bronchodilators and oxygen as needed. 7. Type 2 diabetes mellitus- monitor CBG closely and provide sliding scale coverage and holding home oral medications. 8. Cerebrovascular disease/CAD/PAD-holding Plavix and aspirin secondary to GI bleeding. 9. GERD-IV Protonix ordered.    DVT prophylaxis: SCDs Code Status: Full  Family Communication: telephone updates Disposition Plan: continue stepdown ICU care    Consultants:  GI   Procedures:    Antimicrobials:  Zosyn 6/14 >   Subjective: Patient says that she is having a difficult time keeping down clear liquids.  She says that she has a lot of burning in her esophagus.  Objective: Vitals:   08/19/18 0500 08/19/18  0600 08/19/18 0700 08/19/18 0800  BP: (!) 124/44 (!) 116/41 (!) 117/48 91/67  Pulse: 87 85 86 88  Resp: 18 18 19  (!) 21  Temp:      TempSrc:      SpO2: 100% 100% 100% 100%  Weight: 91.8 kg     Height:        Intake/Output Summary (Last 24 hours) at 08/19/2018 0929 Last data filed at 08/19/2018 0998 Gross per 24 hour  Intake 1960.66 ml  Output 150 ml  Net 1810.66 ml   Filed Weights   08/18/18 1713 08/18/18 2159 08/19/18 0500  Weight: 131.5 kg 91.8 kg 91.8 kg     REVIEW OF SYSTEMS  As per history otherwise all reviewed and reported negative  Exam:  General exam: Morbidly obese female she is sitting up in the bed she is awake and alert and oriented x3.  No apparent distress. Respiratory system: Clear. No increased work of breathing. Cardiovascular system: S1 & S2 heard. No JVD, murmurs, gallops, clicks or pedal edema. Gastrointestinal system: Abdomen is nondistended, soft and nontender. Normal bowel sounds heard. Central nervous system: Alert and oriented. No focal neurological deficits. Extremities: no cyanosis or clubbing.  Data Reviewed: Basic Metabolic Panel: Recent Labs  Lab 08/18/18 1806 08/18/18 1807 08/19/18 0444  NA 134*  --  134*  K 3.0*  --  3.3*  CL 80*  --  88*  CO2 29  --  30  GLUCOSE 261*  --  218*  BUN 54*  --  66*  CREATININE 1.83*  --  1.51*  CALCIUM 7.5*  --  6.8*  MG  --  1.1* 2.5*   Liver Function Tests: Recent Labs  Lab 08/18/18 1806 08/19/18 0444  AST 18 16  ALT 14 12  ALKPHOS 61 50  BILITOT 1.0 0.7  PROT 6.2* 5.3*  ALBUMIN 2.7* 2.4*   Recent Labs  Lab 08/18/18 1806  LIPASE 22   No results for input(s): AMMONIA in the last 168 hours. CBC: Recent Labs  Lab 08/18/18 1806 08/19/18 0444  WBC 25.9* 25.4*  NEUTROABS 23.8* 21.2*  HGB 11.0* 9.4*  HCT 32.8* 29.1*  MCV 83.2 87.7  PLT 367 267   Cardiac Enzymes: Recent Labs  Lab 08/18/18 1806  TROPONINI 0.03*   CBG (last 3)  Recent Labs    08/19/18 0808  GLUCAP 213*     Recent Results (from the past 240 hour(s))  SARS Coronavirus 2 (CEPHEID - Performed in [place] hospital lab), Hosp Order     Status: None   Collection Time: 08/18/18  7:30 PM   Specimen: Nasopharyngeal Swab  Result Value Ref Range Status   SARS Coronavirus 2 NEGATIVE NEGATIVE Final    Comment: (NOTE) If result is NEGATIVE SARS-CoV-2 target nucleic acids are NOT DETECTED. The SARS-CoV-2 RNA is generally detectable in upper and lower  respiratory specimens during the acute phase of infection. The lowest  concentration of SARS-CoV-2 viral copies this assay can detect is 250  copies / mL. A negative result does not preclude SARS-CoV-2 infection  and should not be used as the sole basis for treatment or other  patient management decisions.  A negative result may occur with  improper specimen collection / handling, submission of specimen other  than nasopharyngeal swab, presence of viral mutation(s) within the  areas targeted by this assay, and inadequate number of viral copies  (<250 copies / mL). A negative result must be combined with clinical  observations, patient history, and epidemiological information. If result is POSITIVE SARS-CoV-2 target nucleic acids are DETECTED. The SARS-CoV-2 RNA is generally detectable in upper and lower  respiratory specimens dur ing the acute phase of infection.  Positive  results are indicative of active infection with SARS-CoV-2.  Clinical  correlation with patient history and other diagnostic information is  necessary to determine patient infection status.  Positive results do  not rule out bacterial infection or co-infection with other viruses. If result is PRESUMPTIVE POSTIVE SARS-CoV-2 nucleic acids MAY BE PRESENT.   A presumptive positive result was obtained on the submitted specimen  and confirmed on repeat testing.  While 2019 novel coronavirus  (SARS-CoV-2) nucleic acids may be present in the submitted sample  additional confirmatory  testing may be necessary for epidemiological  and / or clinical management purposes  to differentiate between  SARS-CoV-2 and other Sarbecovirus currently known to infect humans.  If clinically indicated additional testing with an alternate test  methodology [phone]) is advised. The SARS-CoV-2 RNA is generally  detectable in upper and lower respiratory sp ecimens during the acute  phase of infection. The expected result is Negative. Fact Sheet for Patients:  StrictlyIdeas.no Fact Sheet for Healthcare Providers: BankingDealers.co.za This test is not yet approved or cleared by the [place] FDA and has been authorized for detection and/or diagnosis of SARS-CoV-2 by FDA under an Emergency Use Authorization (EUA).  This EUA will remain in effect (meaning this test can be used) for the duration of the COVID-19 declaration under Section 564(b)(1) of the Act, 21 U.S.C. section 360bbb-3(b)(1), unless the authorization is terminated or revoked sooner. Performed at [facility], [address] [place], [place] [address]   Culture, blood (x 2)  Status: None (Preliminary result)   Collection Time: 08/18/18  8:08 PM   Specimen: BLOOD LEFT ARM  Result Value Ref Range Status   Specimen Description BLOOD LEFT ARM  Final   Special Requests   Final    BOTTLES DRAWN AEROBIC AND ANAEROBIC Blood Culture adequate volume Performed at [facility], [address], [place], [place] [address]    Culture PENDING  Incomplete   Report Status PENDING  Incomplete  Culture, blood (x 2)     Status: None (Preliminary result)   Collection Time: 08/18/18  9:18 PM   Specimen: Left Antecubital; Blood  Result Value Ref Range Status   Specimen Description LEFT ANTECUBITAL  Final   Special Requests   Final    BOTTLES DRAWN AEROBIC AND ANAEROBIC Blood Culture adequate volume Performed at [facility], [address] [place], [place] [address]    Culture PENDING   Incomplete   Report Status PENDING  Incomplete  MRSA PCR Screening     Status: None   Collection Time: 08/18/18  9:47 PM   Specimen: Nasal Mucosa; Nasopharyngeal  Result Value Ref Range Status   MRSA by PCR NEGATIVE NEGATIVE Final    Comment:        The GeneXpert MRSA Assay (FDA approved for NASAL specimens only), is one component of a comprehensive MRSA colonization surveillance program. It is not intended to diagnose MRSA infection nor to guide or monitor treatment for MRSA infections. Performed at [facility], [address] [place],  [address]      Studies: Ct Abdomen Pelvis Wo Contrast  Result Date: 08/18/2018 CLINICAL DATA:  Diarrhea. EXAM: CT ABDOMEN AND PELVIS WITHOUT CONTRAST TECHNIQUE: Multidetector CT imaging of the abdomen and pelvis was performed following the standard protocol without IV contrast. COMPARISON:  None. FINDINGS: Lower chest: The heart size is normal. There are mitral valve calcifications. There are coronary artery calcifications. Hepatobiliary: No focal liver abnormality is seen. Status post cholecystectomy. No biliary dilatation. Pancreas: Unremarkable. No pancreatic ductal dilatation or surrounding inflammatory changes. Spleen: Normal in size without focal abnormality. 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 There is no hydronephrosis. There are no radiopaque obstructing kidney stones. The bladder is decompressed which limits evaluation. Stomach/Bowel: There is scattered colonic diverticula without CT evidence of diverticulitis. There is a probable intraluminal lipoma within the descending colon. The appendix is not reliably identified, however there are no significant inflammatory changes in the right lower quadrant. The stomach is unremarkable. A duodenal diverticulum is noted. There is some fluid within the partially visualized esophagus. There is some esophageal wall thickening involving the distal esophagus.  Vascular/Lymphatic: Aortic atherosclerosis. No enlarged abdominal or pelvic lymph nodes. Reproductive: Status post hysterectomy. No adnexal masses. Other: There is a fat containing periumbilical hernia. Musculoskeletal: There are degenerative changes throughout the visualized thoracolumbar spine without evidence of a fracture. IMPRESSION: 1. The partially visualized esophagus is fluid filled with associated wall thickening. Findings can be seen in patients with esophagitis. 2. Scattered colonic diverticula without CT evidence of diverticulitis. 3. Status post cholecystectomy. The appendix is not reliably identified, however there are no significant inflammatory changes in the right lower quadrant. Aortic Atherosclerosis (ICD10-I70.0). Electronically Signed   By: Constance Holster M.D.   On: 08/18/2018 20:09   Dg Chest Port 1 View  Result Date: 08/18/2018 CLINICAL DATA:  Nausea, vomiting, and diarrhea. EXAM: PORTABLE CHEST 1 VIEW COMPARISON:  August 12, 2016 FINDINGS: There is a skin fold over the upper right  chest with lung markings on both sides. No pneumothorax. The cardiomediastinal silhouette is stable. No pulmonary nodules, masses, or focal infiltrates. IMPRESSION: No active disease. Electronically Signed   By: Dorise Bullion III M.D   On: 08/18/2018 17:50     Scheduled Meds:  Chlorhexidine Gluconate Cloth  6 each Topical Daily   DULoxetine  60 mg Oral Daily   insulin aspart  0-15 Units Subcutaneous TID WC   insulin aspart  0-5 Units Subcutaneous QHS   insulin aspart  4 Units Subcutaneous TID WC   insulin glargine  15 Units Subcutaneous QHS   mouth rinse  15 mL Mouth Rinse BID   midodrine  5 mg Oral TID   mometasone-formoterol  2 puff Inhalation BID   pantoprazole (PROTONIX) IV  40 mg Intravenous Q12H   potassium chloride  30 mEq Oral Once   rosuvastatin  40 mg Oral QHS   Continuous Infusions:  piperacillin-tazobactam (ZOSYN)  IV 3.375 g (08/19/18 0909)    Principal  Problem:   Sepsis due to undetermined organism ([place]) Active Problems:   COPD (chronic obstructive pulmonary disease) (HCC)   Type II diabetes mellitus (HCC)   PAD (peripheral artery disease) (HCC)   Tobacco abuse   Hypercholesterolemia   GERD (gastroesophageal reflux disease)   Hypokalemia   Hypomagnesemia   AKI (acute kidney injury) ([place])   Anemia   Prolonged QT interval   Acute esophagitis   Heme positive stool   Melena   Leukocytosis  Critical care Time spent: 31 minutes  Irwin Brakeman, MD [facility] 08/19/2018, 9:29 AM    LOS: 1 day  How to contact the [facility] Attending or Consulting provider [place] or covering provider during after hours [place], for this patient?  1. Check the care team in [facility] and look for a) attending/consulting [facility] provider listed and b) the [facility] team listed 2. Log into [email or web address] and use [place]'s universal password to access. If you do not have the password, please contact the hospital operator. 3. Locate the [facility] provider you are looking for under [facility] and page to a number that you can be directly reached. 4. If you still have difficulty reaching the provider, please page the [facility] (Director on Call) for the Hospitalists listed on amion for assistance.

## 2018-08-19 NOTE — Consult Note (Signed)
Referring Provider: Irwin Brakeman, MD Primary Care Physician:  Keane Police, MD Primary Gastroenterologist:  Dr. Laural Golden  Reason for Consultation:    Esophagitis and heme positive stool.  HPI:   History is obtained from patient and her daughter Olin Hauser.  Patient was brought to emergency room by EMS yesterday evening for 1 week history of nausea vomiting diarrhea and according to her daughter she also vomited coffee-ground material before she came into the emergency room.  Evaluation in the emergency room revealed leukocytosis elevated BUN and creatinine elevated serum lactic acid level.  Chest film was negative for pneumonia and chest CT revealed fluid in distal esophagus and wall thickening as well as periumbilical hernia colonic diverticulosis and atherosclerotic changes to the aorta but no wall thickening noted to small or large bowel.  Patient was admitted to ICU.  She was given IV fluids and begun on IV antibiotic.  She initially received a dose of cefepime and metronidazole.  She was then switched to Zosyn.  Metronidazole was stopped because of prolonged QT interval.  She is also noted to have mildly elevated troponin level.  No ST-T wave abnormality noted on EKG from yesterday. Patient states she is feeling some better.  She has noted generalized abdominal pain.  She said she had multiple stools per day prior to admission.  She did not see any blood per rectum but she did have bloody bowel movement while in the ICU.  She complains of daily heartburn in spite of taking Nexium.  She denies dysphagia or melena.  Her daughter states that she has been taking Imodium and Pepto-Bismol but she had not taken Pepto-Bismol the day she had coffee-ground emesis.  Patient denies fever chills or weight loss.  She says she has been diabetic for 29 years. She has a history of constipation and uses Linzess on as-needed basis.  She is also on low-dose aspirin and clopidogrel which are on hold. Patient's  last colonoscopy was in July 2016 by Dr. Scarlette Shorts revealing sigmoid diverticulosis, 11 mm tubular adenoma at sigmoid colon and rectal ulcers felt to be due to fecal impaction. She has undergone EGD in the past but does not remember the findings.  She is widowed.  She is retired.  She worked in Clinical cytogeneticist for 39 years.  She has been living with her daughter for the last 7 years.  She smokes about a pack a day.  She states she started smoking when she was 78 years old.  She does not drink alcohol.  She has 2 sons and 1 1 daughter in good health.  She lost 4 child at age 23-year-old because of spina bifida. Father lived to be 42.  He had hypertension.  Mother had bronchial asthma and died at 49.  He has 1 brother and 1 sister living.  Her sister is undergoing chemotherapy for what sounds like liver mets from colon carcinoma.  She is 4 years old.  3 sisters are disease.  One sister had carcinoma of the lung and died at 79.   Past Medical History:  Diagnosis Date  . Anxiety   . Arthritis    "joints" (09/08/2014)  . Asthma   . CHF (congestive heart failure) ([place])   . Chronic bronchitis ([place])    "get it q yr"  . COPD (chronic obstructive pulmonary disease) ([place])   . Depression   . Fibromyalgia   . Gastric ulcer   . GERD (gastroesophageal reflux disease)   . 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 History of blood transfusion ?2015   "cause I was throwing it up"  . History of hiatal hernia   . Hypercholesterolemia   . 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 On home oxygen therapy    "2L ususally at night time" (09/08/2014)  . Pulmonary embolism ([place])    "when I had my gallbladder taken out"  . Type II diabetes mellitus ([place])     Past Surgical History:  Procedure Laterality Date  . ABDOMINAL HYSTERECTOMY    . CARPAL TUNNEL RELEASE Bilateral   . CATARACT EXTRACTION W/ INTRAOCULAR LENS  IMPLANT, BILATERAL Bilateral   . COLONOSCOPY N/A 09/09/2014   Procedure: COLONOSCOPY;   Surgeon: Irene Shipper, MD;  Location: [place];  Service: Endoscopy;  Laterality: N/A;  . EXCISIONAL HEMORRHOIDECTOMY    . INCISION AND DRAINAGE ABSCESS Right 08/09/2016   Procedure: INCISION AND DRAINAGE ABSCESS;  Surgeon: Aviva Signs, MD;  Location: [facility];  Service: General;  Laterality: Right;  . LAPAROSCOPIC CHOLECYSTECTOMY    . MELANOMA EXCISION     "off my nose"  . TUBAL LIGATION      Prior to Admission medications   Medication Sig Start Date End Date Taking? Authorizing Provider  albuterol (PROVENTIL HFA;VENTOLIN HFA) 108 (90 Base) MCG/ACT inhaler Inhale 1-2 puffs into the lungs every 6 (six) hours as needed for wheezing or shortness of breath.   Yes [provider]  ALPRAZolam Duanne Moron) 0.5 MG tablet Take 0.5 mg by mouth 3 (three) times daily as needed for anxiety.    Yes [provider]  amLODipine (NORVASC) 5 MG tablet Take 1 tablet by mouth daily. 07/19/18  Yes [provider]  aspirin EC 81 MG tablet Take 81 mg by mouth daily.   Yes [provider]  clopidogrel (PLAVIX) 75 MG tablet Take 75 mg by mouth daily.   Yes [provider]  DULoxetine (CYMBALTA) 60 MG capsule Take 60 mg by mouth daily.    Yes [provider]  esomeprazole (NEXIUM) 40 MG capsule Take 40 mg by mouth daily at 12 noon.   Yes [provider]  fluticasone (FLONASE) 50 MCG/ACT nasal spray Place 2 sprays into both nostrils daily.   Yes [provider]  Fluticasone-Salmeterol (ADVAIR) 250-50 MCG/DOSE AEPB Inhale 1 puff into the lungs daily as needed.    Yes [provider]  HYDROcodone-acetaminophen (NORCO/VICODIN) 5-325 MG tablet Take 1 tablet by mouth every 6 (six) hours as needed for moderate pain. 08/26/16  Yes Arrien, Jimmy Picket, MD  ibuprofen (ADVIL,MOTRIN) 400 MG tablet Take 2 tablets (800 mg total) by mouth every 8 (eight) hours as needed for mild pain or moderate pain. 08/26/16  Yes Arrien, Jimmy Picket, MD  insulin  aspart (NOVOLOG) 100 UNIT/ML injection Inject 0-10 Units into the skin 3 (three) times daily with meals. For glucose 150 to 200 use 2 units, 201 to 250 use 4 units, 251-300 use 6 units, 301-350 use 8 units, 351 or greater 10 units. 08/26/16  Yes Arrien, Jimmy Picket, MD  insulin glargine (LANTUS) 100 UNIT/ML injection Inject 0.2 mLs (20 Units total) into the skin at bedtime. 08/11/16  Yes Orvan Falconer, MD  ketoconazole (NIZORAL) 2 % cream Apply 1 application topically daily as needed for irritation (APPLIED TO FEET).  07/27/16  Yes [provider]  linaclotide (LINZESS) 145 MCG CAPS capsule Take 145 mcg by mouth daily as needed (for constipation).   Yes [provider]  loperamide (  IMODIUM A-D) 2 MG tablet Take 2 mg by mouth 4 (four) times daily as needed for diarrhea or loose stools.   Yes [provider]  meclizine (ANTIVERT) 12.5 MG tablet Take 12.5 mg by mouth 3 (three) times daily as needed for dizziness.   Yes [provider]  metFORMIN (GLUCOPHAGE) 500 MG tablet Take 500 mg by mouth 2 (two) times daily with a meal.    Yes [provider]  midodrine (PROAMATINE) 5 MG tablet Take 5 mg by mouth 3 (three) times daily.   Yes [provider]  mometasone-formoterol (DULERA) 200-5 MCG/ACT AERO Inhale 2 puffs into the lungs 2 (two) times daily.   Yes [provider]  potassium chloride (K-DUR) 10 MEQ tablet Take 1 tablet (10 mEq total) by mouth daily. 08/14/16  Yes Orvan Falconer, MD  rosuvastatin (CRESTOR) 40 MG tablet Take 40 mg by mouth at bedtime.    Yes [provider]  sulfamethoxazole-trimethoprim (BACTRIM DS,SEPTRA DS) 800-160 MG tablet Take 1 tablet by mouth 2 (two) times daily. 09/27/16  Yes Aviva Signs, MD  traMADol (ULTRAM) 50 MG tablet Take 1 tablet (50 mg total) by mouth every 12 (twelve) hours as needed. 09/13/16  Yes Aviva Signs, MD  furosemide (LASIX) 20 MG tablet Take 1 tablet (20 mg total) by mouth daily. 08/14/16 08/14/17   Orvan Falconer, MD    Current Facility-Administered Medications  Medication Dose Route Frequency Provider Last Rate Last Dose  . acetaminophen (TYLENOL) tablet 650 mg  650 mg Oral Q6H PRN Reubin Milan, MD       Or  . acetaminophen (TYLENOL) suppository 650 mg  650 mg Rectal Q6H PRN Reubin Milan, MD      . albuterol (PROVENTIL) (2.5 MG/3ML) 0.083% nebulizer solution 3 mL  3 mL Inhalation Q6H PRN Wynetta Emery, Clanford L, MD      . ALPRAZolam Duanne Moron) tablet 0.5 mg  0.5 mg Oral TID PRN Reubin Milan, MD   0.5 mg at 08/19/18 0900  . Chlorhexidine Gluconate Cloth 2 % PADS 6 each  6 each Topical Daily Reubin Milan, MD   6 each at 08/19/18 [phone]  . DULoxetine (CYMBALTA) DR capsule 60 mg  60 mg Oral Daily Johnson, Clanford L, MD   60 mg at 08/19/18 0900  . insulin aspart (novoLOG) injection 0-15 Units  0-15 Units Subcutaneous TID WC Johnson, Clanford L, MD   5 Units at 08/19/18 0830  . insulin aspart (novoLOG) injection 0-5 Units  0-5 Units Subcutaneous QHS Johnson, Clanford L, MD      . insulin aspart (novoLOG) injection 4 Units  4 Units Subcutaneous TID WC Johnson, Clanford L, MD   4 Units at 08/19/18 0830  . insulin glargine (LANTUS) injection 15 Units  15 Units Subcutaneous QHS Johnson, Clanford L, MD      . MEDLINE mouth rinse  15 mL Mouth Rinse BID Reubin Milan, MD      . midodrine (PROAMATINE) tablet 5 mg  5 mg Oral TID Reubin Milan, MD   5 mg at 08/19/18 0900  . mometasone-formoterol (DULERA) 200-5 MCG/ACT inhaler 2 puff  2 puff Inhalation BID Johnson, Clanford L, MD      . nicotine (NICODERM CQ - dosed in mg/24 hours) patch 21 mg  21 mg Transdermal Daily PRN Reubin Milan, MD      . pantoprazole (PROTONIX) injection 40 mg  40 mg Intravenous Q12H Johnson, Clanford L, MD   40 mg at 08/19/18 0905  .  piperacillin-tazobactam (ZOSYN) IVPB 3.375 g  3.375 g Intravenous Q8H Reubin Milan, MD 12.5 mL/hr at 08/19/18 0909 3.375 g at 08/19/18 0909  . potassium  chloride (K-DUR) CR tablet 30 mEq  30 mEq Oral Once Johnson, Clanford L, MD      . prochlorperazine (COMPAZINE) injection 10 mg  10 mg Intravenous Q4H PRN Reubin Milan, MD      . rosuvastatin (CRESTOR) tablet 40 mg  40 mg Oral QHS Johnson, Clanford L, MD        Allergies as of 08/18/2018 - Review Complete 08/18/2018  Allergen Reaction Noted  . Adhesive [tape] Other (See Comments) 08/06/2016    Family History  Problem Relation Age of Onset  . Heart disease Mother   . AAA (abdominal aortic aneurysm) Mother        Died of aneurysm  . Diabetes Sister   . Lung cancer Sister     Social History   Socioeconomic History  . Marital status: Divorced    Spouse name: Not on file  . Number of children: Not on file  . Years of education: Not on file  . Highest education level: Not on file  Occupational History  . Not on file  Social Needs  . Financial resource strain: Not on file  . Food insecurity    Worry: Not on file    Inability: Not on file  . Transportation needs    Medical: Not on file    Non-medical: Not on file  Tobacco Use  . Smoking status: Current Every Day Smoker    Packs/day: 1.00    Years: 32.00    Pack years: 32.00    Types: Cigarettes  . Smokeless tobacco: Never Used  Substance and Sexual Activity  . Alcohol use: Not Currently    Comment: 09/08/2014 "I drank a little bit a long time ago"  . Drug use: No  . Sexual activity: Not Currently  Lifestyle  . Physical activity    Days per week: Not on file    Minutes per session: Not on file  . Stress: Not on file  Relationships  . Social Herbalist on phone: Not on file    Gets together: Not on file    Attends religious service: Not on file    Active member of club or organization: Not on file    Attends meetings of clubs or organizations: Not on file    Relationship status: Not on file  . Intimate partner violence    Fear of current or ex partner: Not on file    Emotionally abused: Not on file     Physically abused: Not on file    Forced sexual activity: Not on file  Other Topics Concern  . Not on file  Social History Narrative  . Not on file    Review of Systems: See HPI, otherwise normal ROS  Physical Exam: Temp:  [97.5 F (36.4 C)-98.5 F (36.9 C)] 98.5 F (36.9 C) (06/14 0400) Pulse Rate:  [47-143] 93 (06/14 1000) Resp:  [17-28] 17 (06/14 1000) BP: (85-124)/(29-85) 117/85 (06/14 1000) SpO2:  [81 %-100 %] 99 % (06/14 1000) Weight:  [91.8 kg-131.5 kg] 91.8 kg (06/14 0500) Last BM Date: 08/18/18  Patient is alert and in no acute distress. Conjunctiva is pink.  Sclerae nonicteric. Pharyngeal mucosa is dry.  She is edentulous. No thyromegaly or lymphadenopathy noted. Cardiac exam with regular rhythm normal S1 and S2.  No murmur gallop noted. Auscultation of lungs  reveal vesicular breath sounds bilaterally. Abdomen is full.  She has supraumbilical hernia which is soft and partially reducible.  Bowel sounds are normal.  On palpation she has mild generalized tenderness without guarding or rebound.  No organomegaly or masses. She does not have peripheral edema or clubbing.  Lab Results: Recent Labs    08/18/18 1806 08/19/18 0444  WBC 25.9* 25.4*  HGB 11.0* 9.4*  HCT 32.8* 29.1*  PLT 367 267   BMET Recent Labs    08/18/18 1806 08/19/18 0444  NA 134* 134*  K 3.0* 3.3*  CL 80* 88*  CO2 29 30  GLUCOSE 261* 218*  BUN 54* 66*  CREATININE 1.83* 1.51*  CALCIUM 7.5* 6.8*   LFT Recent Labs    08/19/18 0444  PROT 5.3*  ALBUMIN 2.4*  AST 16  ALT 12  ALKPHOS 50  BILITOT 0.7    Studies/Results: Ct Abdomen Pelvis Wo Contrast  Result Date: 08/18/2018 CLINICAL DATA:  Diarrhea. EXAM: CT ABDOMEN AND PELVIS WITHOUT CONTRAST TECHNIQUE: Multidetector CT imaging of the abdomen and pelvis was performed following the standard protocol without IV contrast. COMPARISON:  None. FINDINGS: Lower chest: The heart size is normal. There are mitral valve calcifications.  There are coronary artery calcifications. Hepatobiliary: No focal liver abnormality is seen. Status post cholecystectomy. No biliary dilatation. Pancreas: Unremarkable. No pancreatic ductal dilatation or surrounding inflammatory changes. Spleen: Normal in size without focal abnormality. 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 There is no hydronephrosis. There are no radiopaque obstructing kidney stones. The bladder is decompressed which limits evaluation. Stomach/Bowel: There is scattered colonic diverticula without CT evidence of diverticulitis. There is a probable intraluminal lipoma within the descending colon. The appendix is not reliably identified, however there are no significant inflammatory changes in the right lower quadrant. The stomach is unremarkable. A duodenal diverticulum is noted. There is some fluid within the partially visualized esophagus. There is some esophageal wall thickening involving the distal esophagus. Vascular/Lymphatic: Aortic atherosclerosis. No enlarged abdominal or pelvic lymph nodes. Reproductive: Status post hysterectomy. No adnexal masses. Other: There is a fat containing periumbilical hernia. Musculoskeletal: There are degenerative changes throughout the visualized thoracolumbar spine without evidence of a fracture. IMPRESSION: 1. The partially visualized esophagus is fluid filled with associated wall thickening. Findings can be seen in patients with esophagitis. 2. Scattered colonic diverticula without CT evidence of diverticulitis. 3. Status post cholecystectomy. The appendix is not reliably identified, however there are no significant inflammatory changes in the right lower quadrant. Aortic Atherosclerosis (ICD10-I70.0). Electronically Signed   By: Constance Holster M.D.   On: 08/18/2018 20:09   Dg Chest Port 1 View  Result Date: 08/18/2018 CLINICAL DATA:  Nausea, vomiting, and diarrhea. EXAM: PORTABLE CHEST 1 VIEW COMPARISON:  August 12, 2016 FINDINGS: There is a skin fold over the upper right chest with lung markings on both sides. No pneumothorax. The cardiomediastinal silhouette is stable. No pulmonary nodules, masses, or focal infiltrates. IMPRESSION: No active disease. Electronically Signed   By: Dorise Bullion III M.D   On: 08/18/2018 17:50   I have reviewed imaging studies which include chest film and CT abdomen and pelvis without contrast.  Chest film is negative for pneumonia and CT reveals esophageal wall thickening and some fluid in the esophagus as well as colonic diverticulosis.  She also has small umbilical/periumbilical hernia containing fat as well as aortic atherosclerosis.  Assessment;  Patient's acute symptoms of nausea vomiting diarrhea leukocytosis and lactic acidosis appear to  be due to acute gastroenteritis most likely bacterial infection.  Stool studies as well as GI pathogen panel are pending.  She is presently on on Zosyn.  Metronidazole was discontinued because of prolonged QT interval.  Clinically patient appears to be doing better.  Antibiotic therapy be can be further tailored depending on stool studies and cultures. If stool studies are negative and she remains with diarrhea would consider flexible sigmoidoscopy.  Upper GI bleed.  She had coffee-ground emesis prior to admission.  CT reveals esophageal wall thickening.  Hemoglobin has dropped significantly.  Drop in what appeared to be due to hydration and GI blood loss.  Patient is on low-dose aspirin and clopidogrel.  Differential diagnosis includes peptic ulcer disease or bleeding due to reflux esophagitis.  Since symptoms are poorly controlled with oral PPI she may also have an element of gastroparesis which can be evaluated at a later date.  She will benefit from diagnostic EGD.  Renal dysfunction.  Patient's creatinine clearance has improved with IV fluids but BUN has gone up and this may be function of upper GI bleed.  She is for repeat lab in  a.m.  Mildly elevated serum troponin level.  She has no chest pain EKG abnormality to suggest myocardial injury.  Elevated serum troponin level may be elevated due to systemic illness and renal dysfunction.  Patient's condition discussed with her daughter Olin Hauser over the phone.  Recommendations;  Patient will have CBC with differential INR and C met in a.m. Possible esophagogastroduodenoscopy on 08/20/2018. If stool studies are negative and she remains with diarrhea she may also need flexible sigmoidoscopy.   LOS: 1 day   Monika Chestang  08/19/2018, 11:16 AM

## 2018-08-19 NOTE — Progress Notes (Signed)
Gross blood noted in incontinent liquid stool. Blood clots noted in loose BM.  Unable to collect for Cdiff sample. Peri and bed change provided.

## 2018-08-19 NOTE — Progress Notes (Signed)
Hypoglycemic Event  CBG: 60  Treatment: Gave patient ginger ale   Symptoms: None   Follow-up CBG: Time: 1630 CBG Result:101  Possible Reasons for Event: Clear liquid diet, pt not eating a lot      Carnisha Feltz

## 2018-08-20 ENCOUNTER — Other Ambulatory Visit: Payer: Self-pay

## 2018-08-20 ENCOUNTER — Encounter (HOSPITAL_COMMUNITY)
Admission: EM | Disposition: A | Discharge status: Home Health | Payer: Self-pay | Source: Home / Self Care | Attending: Family Medicine

## 2018-08-20 ENCOUNTER — Encounter (HOSPITAL_COMMUNITY): Payer: Self-pay

## 2018-08-20 DIAGNOSIS — K921 Melena: Secondary | ICD-10-CM

## 2018-08-20 DIAGNOSIS — T18128A Food in esophagus causing other injury, initial encounter: Secondary | ICD-10-CM

## 2018-08-20 DIAGNOSIS — K259 Gastric ulcer, unspecified as acute or chronic, without hemorrhage or perforation: Secondary | ICD-10-CM

## 2018-08-20 DIAGNOSIS — K3189 Other diseases of stomach and duodenum: Secondary | ICD-10-CM

## 2018-08-20 HISTORY — PX: ESOPHAGOGASTRODUODENOSCOPY: SHX5428

## 2018-08-20 HISTORY — PX: BIOPSY: SHX5522

## 2018-08-20 LAB — COMPREHENSIVE METABOLIC PANEL
ALT: 10 U/L (ref 0–44)
AST: 15 U/L (ref 15–41)
Albumin: 2.3 g/dL — ABNORMAL LOW (ref 3.5–5.0)
Alkaline Phosphatase: 50 U/L (ref 38–126)
Anion gap: 14 (ref 5–15)
BUN: 48 mg/dL — ABNORMAL HIGH (ref 8–23)
CO2: 31 mmol/L (ref 22–32)
Calcium: 7.5 mg/dL — ABNORMAL LOW (ref 8.9–10.3)
Chloride: 91 mmol/L — ABNORMAL LOW (ref 98–111)
Creatinine, Ser: 1.19 mg/dL — ABNORMAL HIGH (ref 0.44–1.00)
GFR calc Af Amer: 51 mL/min — ABNORMAL LOW (ref >60)
GFR calc non Af Amer: 44 mL/min — ABNORMAL LOW (ref >60)
Glucose, Bld: 143 mg/dL — ABNORMAL HIGH (ref 70–99)
Potassium: 3 mmol/L — ABNORMAL LOW (ref 3.5–5.1)
Sodium: 136 mmol/L (ref 135–145)
Total Bilirubin: 0.4 mg/dL (ref 0.3–1.2)
Total Protein: 5.5 g/dL — ABNORMAL LOW (ref 6.5–8.1)

## 2018-08-20 LAB — PROTIME-INR
INR: 1 (ref 0.8–1.2)
Prothrombin Time: 13.2 seconds (ref 11.4–15.2)

## 2018-08-20 LAB — CBC WITH DIFFERENTIAL/PLATELET
Abs Immature Granulocytes: 0.17 10*3/uL — ABNORMAL HIGH (ref 0.00–0.07)
Basophils Absolute: 0 10*3/uL (ref 0.0–0.1)
Basophils Relative: 0 %
Eosinophils Absolute: 0.3 10*3/uL (ref 0.0–0.5)
Eosinophils Relative: 2 %
HCT: 23 % — ABNORMAL LOW (ref 36.0–46.0)
Hemoglobin: 7.7 g/dL — ABNORMAL LOW (ref 12.0–15.0)
Immature Granulocytes: 1 %
Lymphocytes Relative: 21 %
Lymphs Abs: 3.1 10*3/uL (ref 0.7–4.0)
MCH: 28.1 pg (ref 26.0–34.0)
MCHC: 33.5 g/dL (ref 30.0–36.0)
MCV: 83.9 fL (ref 80.0–100.0)
Monocytes Absolute: 1 10*3/uL (ref 0.1–1.0)
Monocytes Relative: 6 %
Neutro Abs: 10.4 10*3/uL — ABNORMAL HIGH (ref 1.7–7.7)
Neutrophils Relative %: 70 %
Platelets: 224 10*3/uL (ref 150–400)
RBC: 2.74 MIL/uL — ABNORMAL LOW (ref 3.87–5.11)
RDW: 13.6 % (ref 11.5–15.5)
WBC: 14.9 10*3/uL — ABNORMAL HIGH (ref 4.0–10.5)
nRBC: 0 % (ref 0.0–0.2)

## 2018-08-20 LAB — GLUCOSE, CAPILLARY
Glucose-Capillary: 143 mg/dL — ABNORMAL HIGH (ref 70–99)
Glucose-Capillary: 146 mg/dL — ABNORMAL HIGH (ref 70–99)
Glucose-Capillary: 208 mg/dL — ABNORMAL HIGH (ref 70–99)
Glucose-Capillary: 156 mg/dL — ABNORMAL HIGH (ref 70–99)
Glucose-Capillary: 114 mg/dL — ABNORMAL HIGH (ref 70–99)
Glucose-Capillary: 215 mg/dL — ABNORMAL HIGH (ref 70–99)

## 2018-08-20 LAB — URINE CULTURE: Culture: NO GROWTH

## 2018-08-20 LAB — MAGNESIUM: Magnesium: 2.3 mg/dL (ref 1.7–2.4)

## 2018-08-20 LAB — OCCULT BLOOD, POC DEVICE: Fecal Occult Bld: POSITIVE — AB

## 2018-08-20 LAB — KOH PREP

## 2018-08-20 SURGERY — EGD (ESOPHAGOGASTRODUODENOSCOPY)
Anesthesia: Monitor Anesthesia Care

## 2018-08-20 MED ORDER — SODIUM CHLORIDE 0.9 % IV SOLN
INTRAVENOUS | Status: DC | PRN
  Administered 2018-08-20: 08:00:00 via INTRAVENOUS

## 2018-08-20 MED ORDER — POTASSIUM CHLORIDE CRYS ER 20 MEQ PO TBCR
30.0000 meq | EXTENDED_RELEASE_TABLET | Freq: Once | ORAL | Status: AC
  Administered 2018-08-20: 30 meq via ORAL
  Filled 2018-08-20: qty 1

## 2018-08-20 MED ORDER — SODIUM CHLORIDE 0.9 % IV SOLN
INTRAVENOUS | Status: DC
  Administered 2018-08-20: 15:00:00 via INTRAVENOUS

## 2018-08-20 MED ORDER — MIDAZOLAM HCL 5 MG/5ML IJ SOLN
INTRAMUSCULAR | Status: AC
  Filled 2018-08-20: qty 5

## 2018-08-20 MED ORDER — MEPERIDINE HCL 50 MG/ML IJ SOLN
INTRAMUSCULAR | Status: AC
  Filled 2018-08-20: qty 1

## 2018-08-20 MED ORDER — MEPERIDINE HCL 50 MG/ML IJ SOLN
INTRAMUSCULAR | Status: DC | PRN
  Administered 2018-08-20: 10 mg via INTRAVENOUS
  Administered 2018-08-20: 15 mg via INTRAVENOUS

## 2018-08-20 MED ORDER — MIDAZOLAM HCL 5 MG/5ML IJ SOLN
INTRAMUSCULAR | Status: DC | PRN
  Administered 2018-08-20: 1 mg via INTRAVENOUS
  Administered 2018-08-20: 2 mg via INTRAVENOUS
  Administered 2018-08-20 (×2): 1 mg via INTRAVENOUS

## 2018-08-20 MED ORDER — SODIUM CHLORIDE 0.9 % IV SOLN
INTRAVENOUS | Status: DC | PRN
  Administered 2018-08-20: 08:00:00 250 mL via INTRAVENOUS

## 2018-08-20 MED ORDER — LIDOCAINE VISCOUS HCL 2 % MT SOLN
OROMUCOSAL | Status: DC | PRN
  Administered 2018-08-20: 1 via OROMUCOSAL

## 2018-08-20 MED ORDER — POTASSIUM CHLORIDE 10 MEQ/100ML IV SOLN
10.0000 meq | INTRAVENOUS | Status: AC
  Administered 2018-08-20 (×4): 10 meq via INTRAVENOUS
  Filled 2018-08-20 (×3): qty 100

## 2018-08-20 MED ORDER — MIDAZOLAM HCL 5 MG/5ML IJ SOLN
INTRAMUSCULAR | Status: AC
  Filled 2018-08-20: qty 10

## 2018-08-20 MED ORDER — LIDOCAINE VISCOUS HCL 2 % MT SOLN
OROMUCOSAL | Status: AC
  Filled 2018-08-20: qty 15

## 2018-08-20 NOTE — Progress Notes (Signed)
Brief EGD note  Brushing taken for KOH from esophageal body to rule out Candida. Erosive gastritis. Large prepyloric ulcer involving 40% of the circumference. Stigmata of bleeding noted.  Biopsies taken from ulcer margin. Normal bulbar and post bulbar mucosa.

## 2018-08-20 NOTE — Care Management Important Message (Signed)
Important Message  Patient Details  Name: Jacqueline Hansen MRN: [id number] Date of Birth: September 30, 1940   Medicare Important Message Given:  Yes(copy was left at bedside-patient in procedure)    Tommy Medal 08/20/2018, 4:06 PM

## 2018-08-20 NOTE — Progress Notes (Signed)
  Subjective:  Patient feels much better.  Heartburn is not as bad.  She denies nausea or vomiting.  She has not had a bowel movement in 24 hours.  She says abdominal pain has resolved.  Denies chest pain or shortness of breath.  Objective: Blood pressure 99/82, pulse 90, temperature 98.6 F (37 C), temperature source Oral, resp. rate 20, height '5\' 4"'$  (1.626 m), weight 94.4 kg, SpO2 100 %. Patient is alert and in no acute distress. Conjunctive is pale.  Sclerae nonicteric. Cardiac exam with regular rhythm normal S1 and S2.  No murmur gallop noted. Abdomen is full.  Bowel sounds are normal.  On palpation is soft and nontender with organomegaly or masses. No LE edema noted.  Labs/studies Results:  CBC Latest Ref Rng & Units 08/20/2018 08/19/2018 08/18/2018  WBC 4.0 - 10.5 K/uL 14.9(H) 25.4(H) 25.9(H)  Hemoglobin 12.0 - 15.0 g/dL 7.7(L) 9.4(L) 11.0(L)  Hematocrit 36.0 - 46.0 % 23.0(L) 29.1(L) 32.8(L)  Platelets 150 - 400 K/uL 224 267 367    CMP Latest Ref Rng & Units 08/20/2018 08/19/2018 08/18/2018  Glucose 70 - 99 mg/dL 143(H) 218(H) 261(H)  BUN 8 - 23 mg/dL 48(H) 66(H) 54(H)  Creatinine 0.44 - 1.00 mg/dL 1.19(H) 1.51(H) 1.83(H)  Sodium 135 - 145 mmol/L 136 134(L) 134(L)  Potassium 3.5 - 5.1 mmol/L 3.0(L) 3.3(L) 3.0(L)  Chloride 98 - 111 mmol/L 91(L) 88(L) 80(L)  CO2 22 - 32 mmol/L '31 30 29  '$ Calcium 8.9 - 10.3 mg/dL 7.5(L) 6.8(L) 7.5(L)  Total Protein 6.5 - 8.1 g/dL 5.5(L) 5.3(L) 6.2(L)  Total Bilirubin 0.3 - 1.2 mg/dL 0.4 0.7 1.0  Alkaline Phos 38 - 126 U/L 50 50 61  AST 15 - 41 U/L '15 16 18  '$ ALT 0 - 44 U/L '10 12 14    '$ Hepatic Function Latest Ref Rng & Units 08/20/2018 08/19/2018 08/18/2018  Total Protein 6.5 - 8.1 g/dL 5.5(L) 5.3(L) 6.2(L)  Albumin 3.5 - 5.0 g/dL 2.3(L) 2.4(L) 2.7(L)  AST 15 - 41 U/L '15 16 18  '$ ALT 0 - 44 U/L '10 12 14  '$ Alk Phosphatase 38 - 126 U/L 50 50 61  Total Bilirubin 0.3 - 1.2 mg/dL 0.4 0.7 1.0    Urine culture is negative. Blood cultures negative so  far.  Assessment:  #1.  Upper GI bleed in setting of acute illness with nausea vomiting and diarrhea.  Significant drop in H&H.  She appears to have stopped bleeding.  He could have peptic ulcer disease or could have bled from Mallory-Weiss tear or reflux esophagitis.  #2.  Nausea vomiting and diarrhea.  Acute symptoms have resolved.  Stool testing not possible as she has not had a bowel movement.  She is tolerating clear liquids.  Significant improvement in leukocytosis in the last 24 hours.  Suspect she may have toxic gastroenteritis.  #3.  Acute kidney injury.  Significant improvement in renal function.  Disproportionate elevation of BUN possibly due to upper GI bleed.  #4.  Sepsis suspected based on presentation.  Urine and blood cultures remain negative.  Patient remains on IV Zosyn.   Recommendations:  Proceed with diagnostic esophagogastroduodenoscopy later today. Patient is agreeable.

## 2018-08-20 NOTE — Progress Notes (Signed)
Pt to EGD at this time

## 2018-08-20 NOTE — Progress Notes (Signed)
PROGRESS NOTE    Jacqueline Hansen  IEP:[id number]  DOB: [birth year]  DOA: 08/18/2018 PCP: Keane Police, MD   Brief Admission Hx: 78 year old female with multiple comorbidities including COPD, GERD, epilepsy, history of PE, diabetes and obesity presented complaining of abdominal discomfort diarrhea nausea and emesis weakness and lightheadedness.  MDM/Assessment & Plan:   1. Acute esophagitis-Pt reports some improvement although she still has burning of the esophagus.  She is tolerating clears.  Her diarrhea has slowed. She was hemoccult positive and GI is following.  She will have EGD when medically stabilized. Continue supportive care. Continue current treatments. 2. Hypomagnesemia- repleted.  3. Hypokalemia- additional replacement ordered. Follow.  4. AKI-prerenal and treatin with IV fluid hydration. 5. Leukocytosis- WBC trending down, Pt is treated with Zosyn. follow-up blood cultures.  6. COPD- continue bronchodilators and oxygen as needed. 7. Type 2 diabetes mellitus- monitor CBG closely and provide sliding scale coverage and holding home oral medications. 8. Cerebrovascular disease/CAD/PAD-holding Plavix and aspirin secondary to GI bleeding. 9. GERD-IV Protonix ordered.    DVT prophylaxis: SCDs Code Status: Full  Family Communication: telephone updates Disposition Plan: continue stepdown ICU care    Consultants:  GI   Procedures:    Antimicrobials:  Zosyn 6/14 >   Subjective: Patient keeping down clear liquids, she says she has less abdominal pain, still having burning of esophagus but it is overall some better.  Diarrhea is slowing down.    Objective: Vitals:   08/20/18 0500 08/20/18 0600 08/20/18 0735 08/20/18 0745  BP: (!) 103/41 (!) 104/43    Pulse: 77 82  (!) 109  Resp: 16 20  17   Temp:    98.6 F (37 C)  TempSrc:    Oral  SpO2: 99% 99% 95% 96%  Weight: 94.4 kg     Height:        Intake/Output Summary (Last 24 hours) at 08/20/2018 0853 Last data  filed at 08/20/2018 0500 Gross per 24 hour  Intake 106.78 ml  Output 1600 ml  Net -1493.22 ml   Filed Weights   08/18/18 2159 08/19/18 0500 08/20/18 0500  Weight: 91.8 kg 91.8 kg 94.4 kg   REVIEW OF SYSTEMS  As per history otherwise all reviewed and reported negative  Exam:  General exam: Morbidly obese female she is sitting up in the bed she is awake and alert and oriented x3.  No apparent distress. Respiratory system: Clear. No increased work of breathing. Cardiovascular system: S1 & S2 heard. No JVD, murmurs, gallops, clicks or pedal edema. Gastrointestinal system: Abdomen is nondistended, soft and nontender. Normal bowel sounds heard. Central nervous system: Alert and oriented. No focal neurological deficits. Extremities: no cyanosis or clubbing.  Data Reviewed: Basic Metabolic Panel: Recent Labs  Lab 08/18/18 1806 08/18/18 1807 08/19/18 0444 08/20/18 0409  NA 134*  --  134* 136  K 3.0*  --  3.3* 3.0*  CL 80*  --  88* 91*  CO2 29  --  30 31  GLUCOSE 261*  --  218* 143*  BUN 54*  --  66* 48*  CREATININE 1.83*  --  1.51* 1.19*  CALCIUM 7.5*  --  6.8* 7.5*  MG  --  1.1* 2.5* 2.3   Liver Function Tests: Recent Labs  Lab 08/18/18 1806 08/19/18 0444 08/20/18 0409  AST 18 16 15   ALT 14 12 10   ALKPHOS 61 50 50  BILITOT 1.0 0.7 0.4  PROT 6.2* 5.3* 5.5*  ALBUMIN 2.7* 2.4* 2.3*   Recent Labs  Lab  08/18/18 1806  LIPASE 22   No results for input(s): AMMONIA in the last 168 hours. CBC: Recent Labs  Lab 08/18/18 1806 08/19/18 0444 08/20/18 0409  WBC 25.9* 25.4* 14.9*  NEUTROABS 23.8* 21.2* 10.4*  HGB 11.0* 9.4* 7.7*  HCT 32.8* 29.1* 23.0*  MCV 83.2 87.7 83.9  PLT 367 267 224   Cardiac Enzymes: Recent Labs  Lab 08/18/18 1806  TROPONINI 0.03*   CBG (last 3)  Recent Labs    08/19/18 2122 08/20/18 0419 08/20/18 0739  GLUCAP 130* 143* 146*   Recent Results (from the past 240 hour(s))  Urine culture     Status: None   Collection Time: 08/18/18   6:40 PM   Specimen: Urine, Catheterized  Result Value Ref Range Status   Specimen Description   Final    URINE, CATHETERIZED Performed at [facility], [address], [place], [place] [address]    Special Requests   Final    NONE Performed at [facility], [address] [place], [place] [address]    Culture   Final    NO GROWTH Performed at [facility], [place] [address] [place], [place] [address]    Report Status 08/20/2018 FINAL  Final  SARS Coronavirus 2 (CEPHEID - Performed in [place] hospital lab), Hosp Order     Status: None   Collection Time: 08/18/18  7:30 PM   Specimen: Nasopharyngeal Swab  Result Value Ref Range Status   SARS Coronavirus 2 NEGATIVE NEGATIVE Final    Comment: (NOTE) If result is NEGATIVE SARS-CoV-2 target nucleic acids are NOT DETECTED. The SARS-CoV-2 RNA is generally detectable in upper and lower  respiratory specimens during the acute phase of infection. The lowest  concentration of SARS-CoV-2 viral copies this assay can detect is 250  copies / mL. A negative result does not preclude SARS-CoV-2 infection  and should not be used as the sole basis for treatment or other  patient management decisions.  A negative result may occur with  improper specimen collection / handling, submission of specimen other  than nasopharyngeal swab, presence of viral mutation(s) within the  areas targeted by this assay, and inadequate number of viral copies  (<250 copies / mL). A negative result must be combined with clinical  observations, patient history, and epidemiological information. If result is POSITIVE SARS-CoV-2 target nucleic acids are DETECTED. The SARS-CoV-2 RNA is generally detectable in upper and lower  respiratory specimens dur ing the acute phase of infection.  Positive  results are indicative of active infection with SARS-CoV-2.  Clinical  correlation with patient history and other diagnostic information is  necessary to determine patient  infection status.  Positive results do  not rule out bacterial infection or co-infection with other viruses. If result is PRESUMPTIVE POSTIVE SARS-CoV-2 nucleic acids MAY BE PRESENT.   A presumptive positive result was obtained on the submitted specimen  and confirmed on repeat testing.  While 2019 novel coronavirus  (SARS-CoV-2) nucleic acids may be present in the submitted sample  additional confirmatory testing may be necessary for epidemiological  and / or clinical management purposes  to differentiate between  SARS-CoV-2 and other Sarbecovirus currently known to infect humans.  If clinically indicated additional testing with an alternate test  methodology [phone]) is advised. The SARS-CoV-2 RNA is generally  detectable in upper and lower respiratory sp ecimens during the acute  phase of infection. The expected result is Negative. Fact Sheet for Patients:  StrictlyIdeas.no Fact Sheet for Healthcare Providers: BankingDealers.co.za  This test is not yet approved or cleared by the [place] and has been authorized for detection and/or diagnosis of SARS-CoV-2 by FDA under an Emergency Use Authorization (EUA).  This EUA will remain in effect (meaning this test can be used) for the duration of the COVID-19 declaration under Section 564(b)(1) of the Act, 21 U.S.C. section 360bbb-3(b)(1), unless the authorization is terminated or revoked sooner. Performed at [facility], [address] [place], [place] [address]   Culture, blood (x 2)     Status: None (Preliminary result)   Collection Time: 08/18/18  8:08 PM   Specimen: BLOOD LEFT ARM  Result Value Ref Range Status   Specimen Description BLOOD LEFT ARM  Final   Special Requests   Final    BOTTLES DRAWN AEROBIC AND ANAEROBIC Blood Culture adequate volume   Culture   Final    NO GROWTH < 24 HOURS Performed at [facility], [address], [place], [place] [address]    Report  Status PENDING  Incomplete  Culture, blood (x 2)     Status: None (Preliminary result)   Collection Time: 08/18/18  9:18 PM   Specimen: Left Antecubital; Blood  Result Value Ref Range Status   Specimen Description LEFT ANTECUBITAL  Final   Special Requests   Final    BOTTLES DRAWN AEROBIC AND ANAEROBIC Blood Culture adequate volume   Culture   Final    NO GROWTH < 24 HOURS Performed at [facility], [address] [place], [place] [address]    Report Status PENDING  Incomplete  MRSA PCR Screening     Status: None   Collection Time: 08/18/18  9:47 PM   Specimen: Nasal Mucosa; Nasopharyngeal  Result Value Ref Range Status   MRSA by PCR NEGATIVE NEGATIVE Final    Comment:        The GeneXpert MRSA Assay (FDA approved for NASAL specimens only), is one component of a comprehensive MRSA colonization surveillance program. It is not intended to diagnose MRSA infection nor to guide or monitor treatment for MRSA infections. Performed at [facility], [address] [place], [place] [address]      Studies: Ct Abdomen Pelvis Wo Contrast  Result Date: 08/18/2018 CLINICAL DATA:  Diarrhea. EXAM: CT ABDOMEN AND PELVIS WITHOUT CONTRAST TECHNIQUE: Multidetector CT imaging of the abdomen and pelvis was performed following the standard protocol without IV contrast. COMPARISON:  None. FINDINGS: Lower chest: The heart size is normal. There are mitral valve calcifications. There are coronary artery calcifications. Hepatobiliary: No focal liver abnormality is seen. Status post cholecystectomy. No biliary dilatation. Pancreas: Unremarkable. No pancreatic ductal dilatation or surrounding inflammatory changes. Spleen: Normal in size without focal abnormality. 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 There is no hydronephrosis. There are no radiopaque obstructing kidney stones. The bladder is decompressed which limits evaluation. Stomach/Bowel: There is scattered colonic  diverticula without CT evidence of diverticulitis. There is a probable intraluminal lipoma within the descending colon. The appendix is not reliably identified, however there are no significant inflammatory changes in the right lower quadrant. The stomach is unremarkable. A duodenal diverticulum is noted. There is some fluid within the partially visualized esophagus. There is some esophageal wall thickening involving the distal esophagus. Vascular/Lymphatic: Aortic atherosclerosis. No enlarged abdominal or pelvic lymph nodes. Reproductive: Status post hysterectomy. No adnexal masses. Other: There is a fat containing periumbilical hernia. Musculoskeletal: There are degenerative changes throughout the visualized thoracolumbar spine without evidence of a fracture. IMPRESSION:  1. The partially visualized esophagus is fluid filled with associated wall thickening. Findings can be seen in patients with esophagitis. 2. Scattered colonic diverticula without CT evidence of diverticulitis. 3. Status post cholecystectomy. The appendix is not reliably identified, however there are no significant inflammatory changes in the right lower quadrant. Aortic Atherosclerosis (ICD10-I70.0). Electronically Signed   By: Constance Holster M.D.   On: 08/18/2018 20:09   Dg Chest Port 1 View  Result Date: 08/18/2018 CLINICAL DATA:  Nausea, vomiting, and diarrhea. EXAM: PORTABLE CHEST 1 VIEW COMPARISON:  August 12, 2016 FINDINGS: There is a skin fold over the upper right chest with lung markings on both sides. No pneumothorax. The cardiomediastinal silhouette is stable. No pulmonary nodules, masses, or focal infiltrates. IMPRESSION: No active disease. Electronically Signed   By: Dorise Bullion III M.D   On: 08/18/2018 17:50     Scheduled Meds:  Chlorhexidine Gluconate Cloth  6 each Topical Daily   DULoxetine  60 mg Oral Daily   insulin aspart  0-9 Units Subcutaneous TID WC   insulin aspart  2 Units Subcutaneous TID WC   insulin  glargine  12 Units Subcutaneous QHS   mouth rinse  15 mL Mouth Rinse BID   midodrine  5 mg Oral TID   mometasone-formoterol  2 puff Inhalation BID   pantoprazole (PROTONIX) IV  40 mg Intravenous Q12H   rosuvastatin  40 mg Oral QHS   Continuous Infusions:  sodium chloride 10 mL/hr at 08/20/18 0807   sodium chloride 250 mL (08/20/18 0805)   piperacillin-tazobactam (ZOSYN)  IV 3.375 g (08/20/18 0806)   potassium chloride 10 mEq (08/20/18 0808)    Principal Problem:   Sepsis due to undetermined organism ([place]) Active Problems:   COPD (chronic obstructive pulmonary disease) (HCC)   Type II diabetes mellitus (HCC)   PAD (peripheral artery disease) (H[facility])   Tobacco abuse   Hypercholesterolemia   GERD (gastroesophageal reflux disease)   Hypokalemia   Hypomagnesemia   AKI (acute kidney injury) ([place])   Anemia   Prolonged QT interval   Acute esophagitis   Heme positive stool   Melena   Leukocytosis  Critical care Time spent: 30 minutes  Irwin Brakeman, MD [facility] 08/20/2018, 8:53 AM    LOS: 2 days  How to contact the [facility] Attending or Consulting provider [place] or covering provider during after hours [place], for this patient?  1. Check the care team in [facility] and look for a) attending/consulting [facility] provider listed and b) the [facility] team listed 2. Log into [email or web address] and use [facility]'s universal password to access. If you do not have the password, please contact the hospital operator. 3. Locate the [facility] provider you are looking for under [facility] and page to a number that you can be directly reached. 4. If you still have difficulty reaching the provider, please page the [facility] (Director on Call) for the Hospitalists listed on amion for assistance.

## 2018-08-20 NOTE — Op Note (Signed)
[facility] Patient Name: Jacqueline Hansen Procedure Date: 08/20/2018 2:15 PM MRN: [id number] Date of Birth: 15-Nov-1940 Attending MD: Hildred Laser , MD CSN: [id number] Age: 78 Admit Type: Inpatient Procedure:                Upper GI endoscopy Indications:              Hematemesis Providers:                Hildred Laser, MD, Hinton Rao, RN, Charlsie Quest                            Theda Sers RN, RN Referring MD:             Irwin Brakeman, MD Medicines:                Lidocaine spray, Meperidine 25 mg IV, Midazolam 5                            mg IV Complications:            No immediate complications. Estimated Blood Loss:     Estimated blood loss was minimal. Procedure:                Pre-Anesthesia Assessment:                           - Prior to the procedure, a History and Physical                            was performed, and patient medications and                            allergies were reviewed. The patient's tolerance of                            previous anesthesia was also reviewed. The risks                            and benefits of the procedure and the sedation                            options and risks were discussed with the patient.                            All questions were answered, and informed consent                            was obtained. Prior Anticoagulants: The patient has                            taken no previous anticoagulant or antiplatelet                            agents except for aspirin and has taken no previous  anticoagulant or antiplatelet agents except for                            NSAID medication. ASA Grade Assessment: III - A                            patient with severe systemic disease. After                            reviewing the risks and benefits, the patient was                            deemed in satisfactory condition to undergo the                            procedure.     After obtaining informed consent, the endoscope was                            passed under direct vision. Throughout the                            procedure, the patient's blood pressure, pulse, and                            oxygen saturations were monitored continuously. The                            [id number]-H190 ([id number]) scope was introduced through the                            mouth, and advanced to the second part of duodenum.                            The upper GI endoscopy was accomplished without                            difficulty. The patient tolerated the procedure                            well. Scope In: 3:57:57 PM Scope Out: 4:06:45 PM Total Procedure Duration: 0 hours 8 minutes 48 seconds  Findings:      The proximal esophagus and distal esophagus were normal.      Food was found in the mid esophagus. Cells for cytology were obtained by       brushing.      The Z-line was regular and was found 37 cm from the incisors.      A few erosions were found in the gastric antrum. There were no stigmata       of recent bleeding.      One non-bleeding cratered gastric ulcer with no stigmata of bleeding was       found in the prepyloric region of the stomach. The lesion was 30 mm in       largest dimension. Biopsies were taken with a cold forceps for  histology.      The exam of the stomach was otherwise normal.      The duodenal bulb and second portion of the duodenum were normal. Impression:               - Normal proximal esophagus and distal esophagus.                           - Food in the mid esophagus. Cells for cytology                            obtained.                           - Z-line regular, 37 cm from the incisors.                           - Erosive gastropathy.                           - Large non-bleeding gastric ulcer with no stigmata                            of bleeding. Biopsied.                           - Normal duodenal bulb and second portion  of the                            duodenum. Moderate Sedation:      Moderate (conscious) sedation was administered by the endoscopy nurse       and supervised by the endoscopist. The following parameters were       monitored: oxygen saturation, heart rate, blood pressure, CO2       capnography and response to care. Total physician intraservice time was       16 minutes. Recommendation:           - Return patient to ICU for ongoing care.                           - Diabetic (ADA) diet today.                           - Continue present medications.                           - No OTC NSAIDS otherthan Aspirin which can be                            resumed in two weeks.                           - Await pathology results.                           - Repeat upper endoscopy in 3 months to check  healing. Procedure Code(s):        --- Professional ---                           [phone], Esophagogastroduodenoscopy, flexible,                            transoral; with biopsy, single or multiple                           [id number], Moderate sedation services provided by the                            same physician or other qualified health care                            professional performing a gastrointestinal                            endoscopic service that sedation supports,                            requiring the presence of an independent trained                            observer to assist in the monitoring of the                            patient's level of consciousness and physiological                            status; initial 15 minutes of intra-service time;                            patient age 83 years or older (additional time may                            be reported with [phone], as appropriate) Diagnosis Code(s):        --- Professional ---                           [phone], Food in esophagus causing other injury,                            initial  encounter                           K31.89, Other diseases of stomach and duodenum                           K25.9, Gastric ulcer, unspecified as acute or                            chronic, without hemorrhage or perforation  K92.0, Hematemesis CPT copyright 2019 American Medical Association. All rights reserved. The codes documented in this report are preliminary and upon coder review may  be revised to meet current compliance requirements. Hildred Laser, MD Hildred Laser, MD 08/20/2018 4:23:49 PM This report has been signed electronically. Number of Addenda: 0

## 2018-08-21 ENCOUNTER — Inpatient Hospital Stay (HOSPITAL_COMMUNITY): Payer: Medicare HMO

## 2018-08-21 DIAGNOSIS — D62 Acute posthemorrhagic anemia: Secondary | ICD-10-CM

## 2018-08-21 DIAGNOSIS — I4891 Unspecified atrial fibrillation: Secondary | ICD-10-CM

## 2018-08-21 DIAGNOSIS — K922 Gastrointestinal hemorrhage, unspecified: Secondary | ICD-10-CM

## 2018-08-21 LAB — CBC WITH DIFFERENTIAL/PLATELET
Abs Immature Granulocytes: 0.09 10*3/uL — ABNORMAL HIGH (ref 0.00–0.07)
Basophils Absolute: 0 10*3/uL (ref 0.0–0.1)
Basophils Relative: 0 %
Eosinophils Absolute: 0.2 10*3/uL (ref 0.0–0.5)
Eosinophils Relative: 1 %
HCT: 23.6 % — ABNORMAL LOW (ref 36.0–46.0)
Hemoglobin: 7.7 g/dL — ABNORMAL LOW (ref 12.0–15.0)
Immature Granulocytes: 1 %
Lymphocytes Relative: 16 %
Lymphs Abs: 2 10*3/uL (ref 0.7–4.0)
MCH: 28.3 pg (ref 26.0–34.0)
MCHC: 32.6 g/dL (ref 30.0–36.0)
MCV: 86.8 fL (ref 80.0–100.0)
Monocytes Absolute: 1 10*3/uL (ref 0.1–1.0)
Monocytes Relative: 8 %
Neutro Abs: 9.2 10*3/uL — ABNORMAL HIGH (ref 1.7–7.7)
Neutrophils Relative %: 74 %
Platelets: 200 10*3/uL (ref 150–400)
RBC: 2.72 MIL/uL — ABNORMAL LOW (ref 3.87–5.11)
RDW: 13.7 % (ref 11.5–15.5)
WBC: 12.5 10*3/uL — ABNORMAL HIGH (ref 4.0–10.5)
nRBC: 0 % (ref 0.0–0.2)

## 2018-08-21 LAB — COMPREHENSIVE METABOLIC PANEL
ALT: 11 U/L (ref 0–44)
AST: 15 U/L (ref 15–41)
Albumin: 2.5 g/dL — ABNORMAL LOW (ref 3.5–5.0)
Alkaline Phosphatase: 45 U/L (ref 38–126)
Anion gap: 9 (ref 5–15)
BUN: 23 mg/dL (ref 8–23)
CO2: 33 mmol/L — ABNORMAL HIGH (ref 22–32)
Calcium: 8 mg/dL — ABNORMAL LOW (ref 8.9–10.3)
Chloride: 95 mmol/L — ABNORMAL LOW (ref 98–111)
Creatinine, Ser: 0.83 mg/dL (ref 0.44–1.00)
GFR calc Af Amer: >60 mL/min (ref >60)
GFR calc non Af Amer: >60 mL/min (ref >60)
Glucose, Bld: 211 mg/dL — ABNORMAL HIGH (ref 70–99)
Potassium: 3.5 mmol/L (ref 3.5–5.1)
Sodium: 137 mmol/L (ref 135–145)
Total Bilirubin: 0.4 mg/dL (ref 0.3–1.2)
Total Protein: 5.6 g/dL — ABNORMAL LOW (ref 6.5–8.1)

## 2018-08-21 LAB — GLUCOSE, CAPILLARY
Glucose-Capillary: 153 mg/dL — ABNORMAL HIGH (ref 70–99)
Glucose-Capillary: 171 mg/dL — ABNORMAL HIGH (ref 70–99)
Glucose-Capillary: 146 mg/dL — ABNORMAL HIGH (ref 70–99)
Glucose-Capillary: 141 mg/dL — ABNORMAL HIGH (ref 70–99)
Glucose-Capillary: 145 mg/dL — ABNORMAL HIGH (ref 70–99)

## 2018-08-21 LAB — TSH: TSH: 0.113 u[IU]/mL — ABNORMAL LOW (ref 0.350–4.500)

## 2018-08-21 LAB — MAGNESIUM: Magnesium: 2.2 mg/dL (ref 1.7–2.4)

## 2018-08-21 MED ORDER — NYSTATIN 100000 UNIT/ML MT SUSP
5.0000 mL | Freq: Four times a day (QID) | OROMUCOSAL | Status: DC
  Administered 2018-08-21 – 2018-08-22 (×4): 500000 [IU] via ORAL
  Filled 2018-08-21 (×4): qty 5

## 2018-08-21 MED ORDER — POTASSIUM CHLORIDE CRYS ER 20 MEQ PO TBCR
20.0000 meq | EXTENDED_RELEASE_TABLET | Freq: Every day | ORAL | Status: DC
  Administered 2018-08-21: 10:00:00 20 meq via ORAL
  Filled 2018-08-21 (×2): qty 1

## 2018-08-21 MED ORDER — ZOLPIDEM TARTRATE 5 MG PO TABS
5.0000 mg | ORAL_TABLET | Freq: Every evening | ORAL | Status: DC | PRN
  Administered 2018-08-21: 21:00:00 5 mg via ORAL
  Filled 2018-08-21: qty 1

## 2018-08-21 MED ORDER — METOCLOPRAMIDE HCL 5 MG/ML IJ SOLN
5.0000 mg | Freq: Three times a day (TID) | INTRAMUSCULAR | Status: DC
  Administered 2018-08-21 – 2018-08-22 (×4): 5 mg via INTRAVENOUS
  Filled 2018-08-21 (×4): qty 2

## 2018-08-21 MED ORDER — METOPROLOL TARTRATE 25 MG PO TABS
12.5000 mg | ORAL_TABLET | Freq: Two times a day (BID) | ORAL | Status: DC
  Administered 2018-08-21 (×2): 12.5 mg via ORAL
  Filled 2018-08-21 (×3): qty 1

## 2018-08-21 NOTE — Progress Notes (Signed)
Notified c johnson that pt a fib on telemetry. Noticed that pt does not have a history of a fib

## 2018-08-21 NOTE — Progress Notes (Signed)
Patient is refusing to wear the telemetry box. Explained to her the reason she needs it, due to her going into Afib, she then stated " I don't give a damn, I wanna get out of here." Also threatening to take her IV out. Dr. Wynetta Emery notified.

## 2018-08-21 NOTE — Consult Note (Signed)
Cardiology Consultation:   Patient ID: Jacqueline Hansen MRN: [id number]; DOB: [birth year]  Admit date: 08/18/2018 Date of Consult: 08/21/2018  Primary Care Provider: Keane Police, MD Primary Cardiologist: No primary care provider on file.  Primary Electrophysiologist:  None    Patient Profile:   Jacqueline Hansen is a 78 y.o. female with a hx of multiple medical problems who is being seen today for the evaluation of new onset PAF at the request of Dr Wynetta Emery.  History of Present Illness:   Jacqueline Hansen is a pleasant 78 year old female, not previously seen by cardiology, with multiple medical issues who was admitted 08/18/2018 with sepsis, dehydration, acute gastritis, and electrolyte imbalance.  Her past medical history include includes insulin-dependent diabetes, obesity, COPD, dyslipidemia, GERD, hypertension, and remote pulmonary embolism after gallbladder surgery.  Her EKG on admission shows her to be in sinus rhythm.  There was another EKG done at 2000 hrs. on 613 that was read as sinus rhythm but I believe she was in atrial fibrillation then.  A third EKG done later on the same day at 2356 showed her to clearly be in sinus rhythm.  She has maintained sinus rhythm until about 1 AM this morning when she went into atrial fibrillation.  This lasted till around 7 AM this morning when she reverted spontaneously back to sinus rhythm.  When I interviewed the patient this morning she denies any prior cardiac work-up although she says she has been told she has a history of diastolic heart failure.  She does not remember ever hearing the diagnosis of atrial fibrillation.  She does admit to intermittent palpitations and tachycardia.  She was not on a beta-blocker prior to admission, I suspect this is secondary to COPD, she is on multiple inhalers.  In addition to insulin and Glucophage she is also on aspirin and Plavix, Crestor, and Norvasc AND Midodrine.   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[place])    On home oxygen therapy    "2L ususally at night time" (09/08/2014)   Pulmonary embolism ([place])    "when I had my gallbladder taken out"   Type II diabetes mellitus ([place])     Past Surgical History:  Procedure Laterality Date   ABDOMINAL HYSTERECTOMY     CARPAL TUNNEL RELEASE Bilateral    CATARACT EXTRACTION W/ INTRAOCULAR LENS  IMPLANT, BILATERAL Bilateral    COLONOSCOPY N/A 09/09/2014   Procedure: COLONOSCOPY;  Surgeon: Irene Shipper, MD;  Location: [facility];  Service: Endoscopy;  Laterality: N/A;   EXCISIONAL HEMORRHOIDECTOMY     INCISION AND DRAINAGE ABSCESS Right 08/09/2016   Procedure: INCISION AND DRAINAGE ABSCESS;  Surgeon: Aviva Signs, MD;  Location: [facility];  Service: General;  Laterality: Right;   LAPAROSCOPIC CHOLECYSTECTOMY     MELANOMA EXCISION     "off my nose"   TUBAL LIGATION  Home Medications:  Prior to Admission medications   Medication Sig Start Date End Date Taking? Authorizing Provider  albuterol (PROVENTIL HFA;VENTOLIN HFA) 108 (90 Base) MCG/ACT inhaler Inhale 1-2 puffs into the lungs every 6 (six) hours as needed for wheezing or shortness of breath.   Yes [provider]  ALPRAZolam Duanne Moron) 0.5 MG tablet Take 0.5 mg by mouth 3 (three) times daily as needed for anxiety.    Yes [provider]  amLODipine (NORVASC) 5 MG tablet Take 1 tablet by mouth daily. 07/19/18  Yes [provider]  aspirin EC 81 MG tablet Take 81 mg  by mouth daily.   Yes [provider]  clopidogrel (PLAVIX) 75 MG tablet Take 75 mg by mouth daily.   Yes [provider]  DULoxetine (CYMBALTA) 60 MG capsule Take 60 mg by mouth daily.    Yes [provider]  esomeprazole (NEXIUM) 40 MG capsule Take 40 mg by mouth daily at 12 noon.   Yes [provider]  fluticasone (FLONASE) 50 MCG/ACT nasal spray Place 2 sprays into both nostrils daily.   Yes [provider]  Fluticasone-Salmeterol (ADVAIR) 250-50 MCG/DOSE AEPB Inhale 1 puff into the lungs daily as needed.    Yes [provider]  HYDROcodone-acetaminophen (NORCO/VICODIN) 5-325 MG tablet Take 1 tablet by mouth every 6 (six) hours as needed for moderate pain. 08/26/16  Yes Arrien, Jimmy Picket, MD  ibuprofen (ADVIL,MOTRIN) 400 MG tablet Take 2 tablets (800 mg total) by mouth every 8 (eight) hours as needed for mild pain or moderate pain. 08/26/16  Yes Arrien, Jimmy Picket, MD  insulin aspart (NOVOLOG) 100 UNIT/ML injection Inject 0-10 Units into the skin 3 (three) times daily with meals. For glucose 150 to 200 use 2 units, 201 to 250 use 4 units, 251-300 use 6 units, 301-350 use 8 units, 351 or greater 10 units. 08/26/16  Yes Arrien, Jimmy Picket, MD  insulin glargine (LANTUS) 100 UNIT/ML injection Inject 0.2 mLs (20 Units total) into the skin at bedtime. 08/11/16  Yes Orvan Falconer, MD  ketoconazole (NIZORAL) 2 % cream Apply 1 application topically daily as needed for irritation (APPLIED TO FEET).  07/27/16  Yes [provider]  linaclotide (LINZESS) 145 MCG CAPS capsule Take 145 mcg by mouth daily as needed (for constipation).   Yes [provider]  loperamide (IMODIUM A-D) 2 MG tablet Take 2 mg by mouth 4 (four) times daily as needed for diarrhea or loose stools.   Yes [provider]  meclizine (ANTIVERT) 12.5 MG tablet Take 12.5 mg by mouth 3 (three) times daily as needed for dizziness.   Yes [provider]    metFORMIN (GLUCOPHAGE) 500 MG tablet Take 500 mg by mouth 2 (two) times daily with a meal.    Yes [provider]  midodrine (PROAMATINE) 5 MG tablet Take 5 mg by mouth 3 (three) times daily.   Yes [provider]  mometasone-formoterol (DULERA) 200-5 MCG/ACT AERO Inhale 2 puffs into the lungs 2 (two) times daily.   Yes [provider]  potassium chloride (K-DUR) 10 MEQ tablet Take 1 tablet (10 mEq total) by mouth daily. 08/14/16  Yes Orvan Falconer, MD  rosuvastatin (CRESTOR) 40 MG tablet Take 40 mg by mouth at bedtime.    Yes [provider]  sulfamethoxazole-trimethoprim (BACTRIM DS,SEPTRA DS) 800-160 MG tablet Take 1 tablet by mouth 2 (two) times daily. 09/27/16  Yes Aviva Signs, MD  traMADol (ULTRAM) 50 MG tablet Take 1 tablet (50 mg  total) by mouth every 12 (twelve) hours as needed. 09/13/16  Yes Aviva Signs, MD  furosemide (LASIX) 20 MG tablet Take 1 tablet (20 mg total) by mouth daily. 08/14/16 08/14/17  Orvan Falconer, MD    Inpatient Medications: Scheduled Meds:  Chlorhexidine Gluconate Cloth  6 each Topical Daily   DULoxetine  60 mg Oral Daily   insulin aspart  0-9 Units Subcutaneous TID WC   insulin aspart  2 Units Subcutaneous TID WC   insulin glargine  12 Units Subcutaneous QHS   mouth rinse  15 mL Mouth Rinse BID   midodrine  5 mg Oral TID   mometasone-formoterol  2 puff Inhalation BID   nystatin  5 mL Oral QID   pantoprazole (PROTONIX) IV  40 mg Intravenous Q12H   rosuvastatin  40 mg Oral QHS   Continuous Infusions:  sodium chloride Stopped (08/20/18 1318)   sodium chloride Stopped (08/20/18 1318)   piperacillin-tazobactam (ZOSYN)  IV 3.375 g (08/21/18 0747)   PRN Meds: sodium chloride, sodium chloride, acetaminophen **OR** acetaminophen, albuterol, ALPRAZolam, nicotine, prochlorperazine  Allergies:    Allergies  Allergen Reactions   Adhesive [Tape] Other (See Comments)    Reaction:  Tears pts skin     Social History:    Social History   Socioeconomic History   Marital status: Divorced    Spouse name: Not on file   Number of children: Not on file   Years of education: Not on file   Highest education level: Not on file  Occupational History   Not on file  Social Needs   Financial resource strain: Not on file   Food insecurity    Worry: Not on file    Inability: Not on file   Transportation needs    Medical: Not on file    Non-medical: Not on file  Tobacco Use   Smoking status: Current Every Day Smoker    Packs/day: 1.00    Years: 32.00    Pack years: 32.00    Types: Cigarettes   Smokeless tobacco: Never Used  Substance and Sexual Activity   Alcohol use: Not Currently    Comment: 09/08/2014 "I drank a little bit a long time ago"   Drug use: No   Sexual activity: Not Currently  Lifestyle   Physical activity    Days per week: Not on file    Minutes per session: Not on file   Stress: Not on file  Relationships   Social connections    Talks on phone: Not on file    Gets together: Not on file    Attends religious service: Not on file    Active member of club or organization: Not on file    Attends meetings of clubs or organizations: Not on file    Relationship status: Not on file   Intimate partner violence    Fear of current or ex partner: Not on file    Emotionally abused: Not on file    Physically abused: Not on file    Forced sexual activity: Not on file  Other Topics Concern   Not on file  Social History Narrative   Not on file    Family History:    Family History  Problem Relation Age of Onset   Heart disease Mother    AAA (abdominal aortic aneurysm) Mother        Died of aneurysm   Diabetes Sister    Lung cancer Sister      ROS:  Please see  the history of present illness.  All other ROS reviewed and negative.     Physical Exam/Data:   Vitals:   08/20/18 1943 08/20/18 2153 08/21/18 0515 08/21/18 0756  BP:  (!) 97/53 (!) 114/46   Pulse:   (!) 114 (!) 111   Resp:  20 20   Temp:  98.2 F (36.8 C) 98.5 F (36.9 C)   TempSrc:  Oral Oral   SpO2: 94% 100% 91% 96%  Weight:      Height:        Intake/Output Summary (Last 24 hours) at 08/21/2018 0835 Last data filed at 08/21/2018 0500 Gross per 24 hour  Intake 981.79 ml  Output 2050 ml  Net -1068.21 ml   Last 3 Weights 08/20/2018 08/19/2018 08/18/2018  Weight (lbs) 208 lb 1.8 oz 202 lb 6.1 oz 202 lb 6.1 oz  Weight (kg) 94.4 kg 91.8 kg 91.8 kg     Body mass index is 35.72 kg/m.  General:  Well nourished, well developed, in no acute distress HEENT: normal Lymph: no adenopathy Neck: no JVD Endocrine:  No thryomegaly Cardiac:  normal S1, S2; RRR; no murmur  Lungs:  clear to auscultation bilaterally, no wheezing, rhonchi or rales  Abd: soft, nontender, no hepatomegaly  Ext: no edema Musculoskeletal:  No deformities, BUE and BLE strength normal and equal Skin: warm and dry  Neuro:  CNs 2-12 intact, no focal abnormalities noted Psych:  Normal affect   EKG:  The EKG 08/21/2018 was personally reviewed and demonstrates:  NSR-HR 84  Telemetry:  Telemetry was personally reviewed and demonstrates:  NSR till around 1am then AF with VR 120-130 till & am when she converted back to NSR.   Relevant CV Studies: Echo 2016- Study Conclusions  - Left ventricle: The cavity size was normal. Wall thickness was   normal. Systolic function was normal. The estimated ejection   fraction was in the range of 55% to 60%. - Mitral valve: Severely calcified annulus. - Left atrium: The atrium was mildly dilated. - Atrial septum: There was increased thickness of the septum,   consistent with lipomatous hypertrophy. - Pulmonary arteries: PA peak pressure: 53 mm Hg (S). - Impressions: Shadowing from severe MAC makes evaluation of   vegetations on MV difficult consider TEE if clincially indicated.  Impressions:  - Shadowing from severe MAC makes evaluation of vegetations on MV   difficult  consider TEE if clincially indicated.   Laboratory Data:  Chemistry Recent Labs  Lab 08/19/18 0444 08/20/18 0409 08/21/18 0446  NA 134* 136 137  K 3.3* 3.0* 3.5  CL 88* 91* 95*  CO2 30 31 33*  GLUCOSE 218* 143* 211*  BUN 66* 48* 23  CREATININE 1.51* 1.19* 0.83  CALCIUM 6.8* 7.5* 8.0*  GFRNONAA 33* 44* >60  GFRAA 39* 51* >60  ANIONGAP 16* 14 9    Recent Labs  Lab 08/19/18 0444 08/20/18 0409 08/21/18 0446  PROT 5.3* 5.5* 5.6*  ALBUMIN 2.4* 2.3* 2.5*  AST _0 ALT _1 ALKPHOS 50 50 45  BILITOT 0.7 0.4 0.4   Hematology Recent Labs  Lab 08/19/18 0444 08/20/18 0409 08/21/18 0446  WBC 25.4* 14.9* 12.5*  RBC 3.32* 2.74* 2.72*  HGB 9.4* 7.7* 7.7*  HCT 29.1* 23.0* 23.6*  MCV 87.7 83.9 86.8  MCH 28.3 28.1 28.3  MCHC 32.3 33.5 32.6  RDW 13.6 13.6 13.7  PLT 267 224 200   Cardiac Enzymes Recent Labs  Lab 08/18/18 1806  TROPONINI 0.03*  No results for input(s): TROPIPOC in the last 168 hours.  BNPNo results for input(s): BNP, PROBNP in the last 168 hours.  DDimer No results for input(s): DDIMER in the last 168 hours.  Radiology/Studies:  Ct Abdomen Pelvis Wo Contrast  Result Date: 08/18/2018 CLINICAL DATA:  Diarrhea. EXAM: CT ABDOMEN AND PELVIS WITHOUT CONTRAST TECHNIQUE: Multidetector CT imaging of the abdomen and pelvis was performed following the standard protocol without IV contrast. COMPARISON:  None. FINDINGS: Lower chest: The heart size is normal. There are mitral valve calcifications. There are coronary artery calcifications. Hepatobiliary: No focal liver abnormality is seen. Status post cholecystectomy. No biliary dilatation. Pancreas: Unremarkable. No pancreatic ductal dilatation or surrounding inflammatory changes. Spleen: Normal in size without focal abnormality. 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 There is no hydronephrosis. There are no radiopaque obstructing kidney stones. The bladder is decompressed  which limits evaluation. Stomach/Bowel: There is scattered colonic diverticula without CT evidence of diverticulitis. There is a probable intraluminal lipoma within the descending colon. The appendix is not reliably identified, however there are no significant inflammatory changes in the right lower quadrant. The stomach is unremarkable. A duodenal diverticulum is noted. There is some fluid within the partially visualized esophagus. There is some esophageal wall thickening involving the distal esophagus. Vascular/Lymphatic: Aortic atherosclerosis. No enlarged abdominal or pelvic lymph nodes. Reproductive: Status post hysterectomy. No adnexal masses. Other: There is a fat containing periumbilical hernia. Musculoskeletal: There are degenerative changes throughout the visualized thoracolumbar spine without evidence of a fracture. IMPRESSION: 1. The partially visualized esophagus is fluid filled with associated wall thickening. Findings can be seen in patients with esophagitis. 2. Scattered colonic diverticula without CT evidence of diverticulitis. 3. Status post cholecystectomy. The appendix is not reliably identified, however there are no significant inflammatory changes in the right lower quadrant. Aortic Atherosclerosis (ICD10-I70.0). Electronically Signed   By: Constance Holster M.D.   On: 08/18/2018 20:09   Dg Chest Port 1 View  Result Date: 08/18/2018 CLINICAL DATA:  Nausea, vomiting, and diarrhea. EXAM: PORTABLE CHEST 1 VIEW COMPARISON:  August 12, 2016 FINDINGS: There is a skin fold over the upper right chest with lung markings on both sides. No pneumothorax. The cardiomediastinal silhouette is stable. No pulmonary nodules, masses, or focal infiltrates. IMPRESSION: No active disease. Electronically Signed   By: Dorise Bullion III M.D   On: 08/18/2018 17:50    Assessment and Plan:   New onset AF- Pt converted spontaneously back to NSR  Electrolyte imbalance- Mg++ replaced- 2.2, K+ 3.5 this  am  Erosive gastritis with PUD and GI bleeding- Being followed by Dr Laural Golden  Dehydration and AKI- Improved  IDDM- Per primary service  COPD- On multiple inhalers PTA  Morbid obesity BMI 35- moderate pulmonary HTN on echo 2016   Plan: MD to see. Add low dose beta blocker.  Check TSH.  Keep K+ close to 4.0.  Not a candidate for anticoaguation (CHADS VASC= 5) secondary to GI bleeding.  Check echo.   For questions or updates, please contact [place] Please consult [email or web address] for contact info under     Signed, Kerin Ransom, PA-C  08/21/2018 8:35 AM    Attending note  Patient seen and discussed with PA Rosalyn Gess, I agree with his documentation above. 78 yo female history of anxiety/depression, chronic diastolic HF, fibromyalgia, prior PE, admitted with nausea, diarrhea and abdominal pain.    Admitted with sepsis by primary team. Also being managed for hypovolemia, AKI, acute esophagitis with  upper GI bleeding. She has been on DAPT at home at lest since 2016 by our records, it is unclear to me the indication. Notes mention history of PAD and CVA but no clear history reported. Patient is not sure herself.     07/2018 echo [facility] LVEF 65-70%, mild LVH  Prolonged QTc on admit in setting of  Hypokalemia and hypoMg. Has improved since admission . Monitor with starting reglan, would repeat EKG tomorrow AM  Isolated episode of afib. Not a candidate due to GI bleeding and gastric ulcer. In long run when able would not restart aspirin or plavix and have her on eliquis alone. Recent echo [facility] in care everywhere that was benign, cancel echo here. Monitor for recurrent afib since starting lopressor  We will follow up tele and EKGs tomorrow, otherwise follow peripherally    Zandra Abts MD

## 2018-08-21 NOTE — Progress Notes (Signed)
Subjective:  Patient complains of abdominal pain under rib cage laterally.  She denies epigastric pain.  She says she did vomit after supper.  She is still having intermittent heartburn.  However she did not have hematemesis.  She is passing flatus but has not had a bowel movement in close to 2 days.  She denies chest pain or shortness of breath. Patient tells me that she has been taking Goody powders every day.  Objective: Blood pressure (!) 114/46, pulse (!) 111, temperature 98.5 F (36.9 C), temperature source Oral, resp. rate 20, height '5\' 4"'$  (1.626 m), weight 94.4 kg, SpO2 96 %. Patient is alert and in no acute distress. Conjunctiva is pale.  Sclerae nonicteric. Cardiac exam with irregular rhythm normal S1 and S2.  No murmur or gallop noted. Auscultation lungs reveal vesicular breath sounds bilaterally. Abdomen is full.  Bowel sounds are normal.  On palpation abdomen is soft.  She has mild tenderness below the costal margins laterally.  She does not have epigastric tenderness. No LE edema noted.    Labs/studies Results:  CBC Latest Ref Rng & Units 08/21/2018 08/20/2018 08/19/2018  WBC 4.0 - 10.5 K/uL 12.5(H) 14.9(H) 25.4(H)  Hemoglobin 12.0 - 15.0 g/dL 7.7(L) 7.7(L) 9.4(L)  Hematocrit 36.0 - 46.0 % 23.6(L) 23.0(L) 29.1(L)  Platelets 150 - 400 K/uL 200 224 267    CMP Latest Ref Rng & Units 08/21/2018 08/20/2018 08/19/2018  Glucose 70 - 99 mg/dL 211(H) 143(H) 218(H)  BUN 8 - 23 mg/dL 23 48(H) 66(H)  Creatinine 0.44 - 1.00 mg/dL 0.83 1.19(H) 1.51(H)  Sodium 135 - 145 mmol/L 137 136 134(L)  Potassium 3.5 - 5.1 mmol/L 3.5 3.0(L) 3.3(L)  Chloride 98 - 111 mmol/L 95(L) 91(L) 88(L)  CO2 22 - 32 mmol/L 33(H) 31 30  Calcium 8.9 - 10.3 mg/dL 8.0(L) 7.5(L) 6.8(L)  Total Protein 6.5 - 8.1 g/dL 5.6(L) 5.5(L) 5.3(L)  Total Bilirubin 0.3 - 1.2 mg/dL 0.4 0.4 0.7  Alkaline Phos 38 - 126 U/L 45 50 50  AST 15 - 41 U/L '15 15 16  '$ ALT 0 - 44 U/L '11 10 12    '$ Hepatic Function Latest Ref Rng & Units  08/21/2018 08/20/2018 08/19/2018  Total Protein 6.5 - 8.1 g/dL 5.6(L) 5.5(L) 5.3(L)  Albumin 3.5 - 5.0 g/dL 2.5(L) 2.3(L) 2.4(L)  AST 15 - 41 U/L '15 15 16  '$ ALT 0 - 44 U/L '11 10 12  '$ Alk Phosphatase 38 - 126 U/L 45 50 50  Total Bilirubin 0.3 - 1.2 mg/dL 0.4 0.4 0.7    Gastric ulcer biopsy pending. Blood cultures negative. Cultures negative.  Assessment:  #1.  Upper GI bleed secondary to large prepyloric/gastric ulcer.  She has been on IV pantoprazole this admission 3 days ago.  No stigmata of bleed were evident but this is felt to be source of her upper GI bleed.  Endoscopically this ulcer appeared to be chronic and benign.  Biopsies pending.  #2.  Esophageal brushing positive for few yeast.  She has mild esophageal candidiasis which is felt to be asymptomatic but nevertheless needs to be treated given that she is diabetic on an antibiotic.  #3.  Anemia secondary to upper GI bleed.  Hemoglobin has not dropped any further.  It is 7.7 g.  #4.  Acute kidney injury.  BUN and creatinine now normal.  #5.  Nausea vomiting and diarrhea.  Diarrhea has resolved.  No stool studies possible.  She had postprandial vomiting yesterday.  This would appear to be due to large  prepyloric gastric ulcer.  She does not have pyloric stenosis.  She could have diabetic gastroparesis.  If she vomits again would consider low-dose Reglan.  #6.  EKG from this morning consistent with atrial fibrillation.  She has been on anticoagulant because of pulmonary embolism.  Patient not a candidate for anticoagulant therapy for at least 2 weeks given that she has a giant gastric ulcer and risk of bleed very high.   Recommendations:  Patient advised to be upright in a chair after breakfast for at least 1 to 2 hours. CBC in a.m. Continue IV pantoprazole until vomiting stops. Will consider low-dose IV Reglan if she has another episode of emesis. Statin suspension 500,000 units swish and swallow 4 times a day for 10  days. Reevaluate need for continued antibiotic use.  Will discuss with Dr. Wynetta Emery.

## 2018-08-21 NOTE — Progress Notes (Signed)
Please note patient begun on metoclopramide 5 mg IV for each meal and at bedtime.

## 2018-08-21 NOTE — Progress Notes (Addendum)
PROGRESS NOTE Jacqueline Hansen  YTK:[id number]  DOB: 1940-12-27  DOA: 08/18/2018 PCP: Keane Police, MD  Brief Admission Hx: 78 year old female with multiple comorbidities including COPD, GERD, epilepsy, history of PE, diabetes and obesity presented complaining of abdominal discomfort diarrhea nausea and emesis weakness and lightheadedness.  MDM/Assessment & Plan:   1. Upper GI bleed - EGD with findings of large gastric ulcer.  She will remain on IV protonix until her vomiting has improved/resolved. 2. Newly discovered paroxysmal atrial fibrillation - caught on telemetry and EKG, has spontaneously converted to SR.  I spoke with Dr. Laural Golden and she cannot be anticoagulated for at least 2 weeks because of the large gastric ulcer. She is being seen by inpatient cardiology.  Await recommendations.  Echocardiogram ordered. TSH pending  3. Large gastric ulcer - I counseled patient that she has to stop using the [employer].  I suggested that she use tylenol for her arthritis pain.  She verbalized understanding.  Repeat EGD in 3 months to document healing. 4. Esophageal candidiasis - DC antibiotics.  GI started therapy 6/16.     5. Hypomagnesemia- repleted.  6. Hypokalemia- repleted. Follow.  7. AKI-prerenal and treatin with IV fluid hydration.  Renal function has normalized.  8. Leukocytosis- secondary to GI bleed and large gastric ulceration.  WBC trending down, Pt was treated with Zosyn. BC no growth to date.  DC antibiotics.   9. COPD- continue bronchodilators and oxygen as needed. 10. Type 2 diabetes mellitus- monitor CBG closely and provide sliding scale coverage and holding home oral medications. 11. Cerebrovascular disease/CAD/PAD-holding Plavix and aspirin secondary to GI bleeding. 12. GERD-IV Protonix ordered.    DVT prophylaxis: SCDs Code Status: Full  Family Communication: telephone update daughter Disposition Plan: continue inpatient care, DC home in 1-2 days  Consultants:  GI    Procedures:    Antimicrobials:  Zosyn 6/14 > 6/16  Mycostatin 6/16 >  Subjective: Patient has vomited a couple of times.  Pt had Atrial fibrillation overnight but did not feel palpitations or chest pain or SOB.      Objective: Vitals:   08/20/18 1943 08/20/18 2153 08/21/18 0515 08/21/18 0756  BP:  (!) 97/53 (!) 114/46   Pulse:  (!) 114 (!) 111   Resp:  20 20   Temp:  98.2 F (36.8 C) 98.5 F (36.9 C)   TempSrc:  Oral Oral   SpO2: 94% 100% 91% 96%  Weight:      Height:        Intake/Output Summary (Last 24 hours) at 08/21/2018 0858 Last data filed at 08/21/2018 0857 Gross per 24 hour  Intake 1221.79 ml  Output 2050 ml  Net -828.21 ml   Filed Weights   08/18/18 2159 08/19/18 0500 08/20/18 0500  Weight: 91.8 kg 91.8 kg 94.4 kg   REVIEW OF SYSTEMS  As per history otherwise all reviewed and reported negative  Exam:  General exam: Morbidly obese female she is sitting up in the bed she is awake and alert and oriented x3.  No apparent distress. Respiratory system: Clear. No increased work of breathing. Cardiovascular system: normal S1 & S2 heard. No JVD, murmurs, gallops, clicks or pedal edema. Gastrointestinal system: Abdomen is nondistended, soft and nontender. Normal bowel sounds heard. Central nervous system: Alert and oriented. No focal neurological deficits. Extremities: no cyanosis or clubbing.  Data Reviewed: Basic Metabolic Panel: Recent Labs  Lab 08/18/18 1806 08/18/18 1807 08/19/18 0444 08/20/18 0409 08/21/18 0446  NA 134*  --  134* 136 137  K 3.0*  --  3.3* 3.0* 3.5  CL 80*  --  88* 91* 95*  CO2 29  --  30 31 33*  GLUCOSE 261*  --  218* 143* 211*  BUN 54*  --  66* 48* 23  CREATININE 1.83*  --  1.51* 1.19* 0.83  CALCIUM 7.5*  --  6.8* 7.5* 8.0*  MG  --  1.1* 2.5* 2.3 2.2   Liver Function Tests: Recent Labs  Lab 08/18/18 1806 08/19/18 0444 08/20/18 0409 08/21/18 0446  AST 18 16 15 15   ALT 14 12 10 11   ALKPHOS 61 50 50 45  BILITOT 1.0  0.7 0.4 0.4  PROT 6.2* 5.3* 5.5* 5.6*  ALBUMIN 2.7* 2.4* 2.3* 2.5*   Recent Labs  Lab 08/18/18 1806  LIPASE 22   No results for input(s): AMMONIA in the last 168 hours. CBC: Recent Labs  Lab 08/18/18 1806 08/19/18 0444 08/20/18 0409 08/21/18 0446  WBC 25.9* 25.4* 14.9* 12.5*  NEUTROABS 23.8* 21.2* 10.4* 9.2*  HGB 11.0* 9.4* 7.7* 7.7*  HCT 32.8* 29.1* 23.0* 23.6*  MCV 83.2 87.7 83.9 86.8  PLT 367 267 224 200   Cardiac Enzymes: Recent Labs  Lab 08/18/18 1806  TROPONINI 0.03*   CBG (last 3)  Recent Labs    08/20/18 2153 08/21/18 0302 08/21/18 0721  GLUCAP 215* 153* 171*   Recent Results (from the past 240 hour(s))  Urine culture     Status: None   Collection Time: 08/18/18  6:40 PM   Specimen: Urine, Catheterized  Result Value Ref Range Status   Specimen Description   Final    URINE, CATHETERIZED Performed at [facility], [address] [place], [place] [address]    Special Requests   Final    NONE Performed at [facility], [address], [place], [place] [address]    Culture   Final    NO GROWTH Performed at [facility], [place] [address], [place], [place] [address]    Report Status 08/20/2018 FINAL  Final  SARS Coronavirus 2 (CEPHEID - Performed in [place] hospital lab), Hosp Order     Status: None   Collection Time: 08/18/18  7:30 PM   Specimen: Nasopharyngeal Swab  Result Value Ref Range Status   SARS Coronavirus 2 NEGATIVE NEGATIVE Final    Comment: (NOTE) If result is NEGATIVE SARS-CoV-2 target nucleic acids are NOT DETECTED. The SARS-CoV-2 RNA is generally detectable in upper and lower  respiratory specimens during the acute phase of infection. The lowest  concentration of SARS-CoV-2 viral copies this assay can detect is 250  copies / mL. A negative result does not preclude SARS-CoV-2 infection  and should not be used as the sole basis for treatment or other  patient management decisions.  A negative result may occur with  improper  specimen collection / handling, submission of specimen other  than nasopharyngeal swab, presence of viral mutation(s) within the  areas targeted by this assay, and inadequate number of viral copies  (<250 copies / mL). A negative result must be combined with clinical  observations, patient history, and epidemiological information. If result is POSITIVE SARS-CoV-2 target nucleic acids are DETECTED. The SARS-CoV-2 RNA is generally detectable in upper and lower  respiratory specimens dur ing the acute phase of infection.  Positive  results are indicative of active infection with SARS-CoV-2.  Clinical  correlation with patient history and other diagnostic information is  necessary to determine patient infection status.  Positive results do  not rule out bacterial infection or co-infection with other viruses. If result is PRESUMPTIVE POSTIVE SARS-CoV-2 nucleic acids MAY BE PRESENT.   A presumptive positive result was obtained on the submitted specimen  and confirmed on repeat testing.  While 2019 novel coronavirus  (SARS-CoV-2) nucleic acids may be present in the submitted sample  additional confirmatory testing may be necessary for epidemiological  and / or clinical management purposes  to differentiate between  SARS-CoV-2 and other Sarbecovirus currently known to infect humans.  If clinically indicated additional testing with an alternate test  methodology [phone]) is advised. The SARS-CoV-2 RNA is generally  detectable in upper and lower respiratory sp ecimens during the acute  phase of infection. The expected result is Negative. Fact Sheet for Patients:  StrictlyIdeas.no Fact Sheet for Healthcare Providers: BankingDealers.co.za This test is not yet approved or cleared by the [place] FDA and has been authorized for detection and/or diagnosis of SARS-CoV-2 by FDA under an Emergency Use Authorization (EUA).  This EUA will remain in  effect (meaning this test can be used) for the duration of the COVID-19 declaration under Section 564(b)(1) of the Act, 21 U.S.C. section 360bbb-3(b)(1), unless the authorization is terminated or revoked sooner. Performed at [facility], [address] [place], [place] [address]   Culture, blood (x 2)     Status: None (Preliminary result)   Collection Time: 08/18/18  8:08 PM   Specimen: BLOOD LEFT ARM  Result Value Ref Range Status   Specimen Description BLOOD LEFT ARM  Final   Special Requests   Final    BOTTLES DRAWN AEROBIC AND ANAEROBIC Blood Culture adequate volume   Culture   Final    NO GROWTH 3 DAYS Performed at [facility], [address], [place], [place] [address]    Report Status PENDING  Incomplete  Culture, blood (x 2)     Status: None (Preliminary result)   Collection Time: 08/18/18  9:18 PM   Specimen: Left Antecubital; Blood  Result Value Ref Range Status   Specimen Description LEFT ANTECUBITAL  Final   Special Requests   Final    BOTTLES DRAWN AEROBIC AND ANAEROBIC Blood Culture adequate volume   Culture   Final    NO GROWTH 3 DAYS Performed at [facility], [address], [place], [place] [address]    Report Status PENDING  Incomplete  MRSA PCR Screening     Status: None   Collection Time: 08/18/18  9:47 PM   Specimen: Nasal Mucosa; Nasopharyngeal  Result Value Ref Range Status   MRSA by PCR NEGATIVE NEGATIVE Final    Comment:        The GeneXpert MRSA Assay (FDA approved for NASAL specimens only), is one component of a comprehensive MRSA colonization surveillance program. It is not intended to diagnose MRSA infection nor to guide or monitor treatment for MRSA infections. Performed at [facility], [address] [place], [place] [address]   [place] prep     Status: None   Collection Time: 08/20/18  4:05 PM   Specimen: Esophagus; GI  Result Value Ref Range Status   Specimen Description ESOPHAGUS  Final   Special Requests NONE  Final   KOH Prep    Final    RARE YEAST Performed at [facility], [address] [place], [place] [address]    Report Status 08/20/2018 FINAL  Final     Studies: No results found.   Scheduled Meds: . Chlorhexidine Gluconate Cloth  6 each Topical Daily  .  DULoxetine  60 mg Oral Daily  . insulin aspart  0-9 Units Subcutaneous TID WC  . insulin aspart  2 Units Subcutaneous TID WC  . insulin glargine  12 Units Subcutaneous QHS  . mouth rinse  15 mL Mouth Rinse BID  . midodrine  5 mg Oral TID  . mometasone-formoterol  2 puff Inhalation BID  . nystatin  5 mL Oral QID  . pantoprazole (PROTONIX) IV  40 mg Intravenous Q12H  . rosuvastatin  40 mg Oral QHS   Continuous Infusions: . sodium chloride Stopped (08/20/18 1318)  . sodium chloride Stopped (08/20/18 1318)  . piperacillin-tazobactam (ZOSYN)  IV 3.375 g (08/21/18 0747)    Principal Problem:   Sepsis due to undetermined organism [facility]) Active Problems:   COPD (chronic obstructive pulmonary disease) (HCC)   Type II diabetes mellitus (HCC)   PAD (peripheral artery disease) (HCC)   Tobacco abuse   Hypercholesterolemia   GERD (gastroesophageal reflux disease)   Hypokalemia   Hypomagnesemia   AKI (acute kidney injury) ([place])   Anemia   Prolonged QT interval   Acute esophagitis   Heme positive stool   Melena   Leukocytosis  Irwin Brakeman, MD [facility] 08/21/2018, 8:58 AM    LOS: 3 days  How to contact the [facility] Attending or Consulting provider [place] or covering provider during after hours [place], for this patient?  1. Check the care team in [facility] and look for a) attending/consulting TRH provider listed and b) the [facility] team listed 2. Log into [email or web address] and use [place]'s universal password to access. If you do not have the password, please contact the hospital operator. 3. Locate the [facility] provider you are looking for under [facility] and page to a number that you can be directly reached. 4. If you still have difficulty  reaching the provider, please page the [facility] (Director on Call) for the Hospitalists listed on amion for assistance.

## 2018-08-21 NOTE — Progress Notes (Signed)
Pt hard to redirect. Yelling and cursing. Pt given time to ambulate in hallway with standby assistance and then returned to chair. Pt continues to ask for cigarettes. nicotine patch applied. Pt appears to calm down. Will continue monitor.

## 2018-08-22 LAB — COMPREHENSIVE METABOLIC PANEL
ALT: 11 U/L (ref 0–44)
AST: 14 U/L — ABNORMAL LOW (ref 15–41)
Albumin: 2.5 g/dL — ABNORMAL LOW (ref 3.5–5.0)
Alkaline Phosphatase: 46 U/L (ref 38–126)
Anion gap: 10 (ref 5–15)
BUN: 15 mg/dL (ref 8–23)
CO2: 31 mmol/L (ref 22–32)
Calcium: 8.4 mg/dL — ABNORMAL LOW (ref 8.9–10.3)
Chloride: 96 mmol/L — ABNORMAL LOW (ref 98–111)
Creatinine, Ser: 0.66 mg/dL (ref 0.44–1.00)
GFR calc Af Amer: >60 mL/min (ref >60)
GFR calc non Af Amer: >60 mL/min (ref >60)
Glucose, Bld: 152 mg/dL — ABNORMAL HIGH (ref 70–99)
Potassium: 3.1 mmol/L — ABNORMAL LOW (ref 3.5–5.1)
Sodium: 137 mmol/L (ref 135–145)
Total Bilirubin: 0.6 mg/dL (ref 0.3–1.2)
Total Protein: 5.6 g/dL — ABNORMAL LOW (ref 6.5–8.1)

## 2018-08-22 LAB — CBC WITH DIFFERENTIAL/PLATELET
Abs Immature Granulocytes: 0.08 10*3/uL — ABNORMAL HIGH (ref 0.00–0.07)
Basophils Absolute: 0 10*3/uL (ref 0.0–0.1)
Basophils Relative: 0 %
Eosinophils Absolute: 0.2 10*3/uL (ref 0.0–0.5)
Eosinophils Relative: 1 %
HCT: 23.7 % — ABNORMAL LOW (ref 36.0–46.0)
Hemoglobin: 7.6 g/dL — ABNORMAL LOW (ref 12.0–15.0)
Immature Granulocytes: 1 %
Lymphocytes Relative: 20 %
Lymphs Abs: 2.3 10*3/uL (ref 0.7–4.0)
MCH: 28 pg (ref 26.0–34.0)
MCHC: 32.1 g/dL (ref 30.0–36.0)
MCV: 87.5 fL (ref 80.0–100.0)
Monocytes Absolute: 0.9 10*3/uL (ref 0.1–1.0)
Monocytes Relative: 8 %
Neutro Abs: 7.9 10*3/uL — ABNORMAL HIGH (ref 1.7–7.7)
Neutrophils Relative %: 70 %
Platelets: 196 10*3/uL (ref 150–400)
RBC: 2.71 MIL/uL — ABNORMAL LOW (ref 3.87–5.11)
RDW: 14 % (ref 11.5–15.5)
WBC: 11.4 10*3/uL — ABNORMAL HIGH (ref 4.0–10.5)
nRBC: 0 % (ref 0.0–0.2)

## 2018-08-22 LAB — MAGNESIUM: Magnesium: 2.1 mg/dL (ref 1.7–2.4)

## 2018-08-22 LAB — GLUCOSE, CAPILLARY
Glucose-Capillary: 142 mg/dL — ABNORMAL HIGH (ref 70–99)
Glucose-Capillary: 121 mg/dL — ABNORMAL HIGH (ref 70–99)
Glucose-Capillary: 157 mg/dL — ABNORMAL HIGH (ref 70–99)

## 2018-08-22 MED ORDER — PANTOPRAZOLE SODIUM 40 MG PO TBEC: 40.0000 mg | DELAYED_RELEASE_TABLET | Freq: Every day | ORAL | 1 refills | Status: DC

## 2018-08-22 MED ORDER — POTASSIUM CHLORIDE CRYS ER 20 MEQ PO TBCR
40.0000 meq | EXTENDED_RELEASE_TABLET | Freq: Every day | ORAL | Status: DC
  Administered 2018-08-22: 09:00:00 40 meq via ORAL
  Filled 2018-08-22: qty 2

## 2018-08-22 MED ORDER — METOCLOPRAMIDE HCL 5 MG PO TABS: 5.0000 mg | ORAL_TABLET | Freq: Three times a day (TID) | ORAL | 1 refills | Status: DC

## 2018-08-22 MED ORDER — MIDODRINE HCL 5 MG PO TABS
10.0000 mg | ORAL_TABLET | Freq: Three times a day (TID) | ORAL | Status: DC
  Administered 2018-08-22: 10:00:00 10 mg via ORAL
  Filled 2018-08-22: qty 2

## 2018-08-22 MED ORDER — NYSTATIN 100000 UNIT/ML MT SUSP: 5.0000 mL | Freq: Four times a day (QID) | OROMUCOSAL | 0 refills | Status: AC

## 2018-08-22 MED ORDER — APIXABAN 5 MG PO TABS: 5.0000 mg | ORAL_TABLET | Freq: Two times a day (BID) | ORAL | 3 refills | Status: DC

## 2018-08-22 MED ORDER — MIDODRINE HCL 10 MG PO TABS: 10.0000 mg | ORAL_TABLET | Freq: Three times a day (TID) | ORAL | 3 refills | Status: AC

## 2018-08-22 NOTE — Care Management Important Message (Signed)
Important Message  Patient Details  Name: Jacqueline Hansen MRN: [id number] Date of Birth: [birth year]   Medicare Important Message Given:  Yes    Tommy Medal 08/22/2018, 10:45 AM

## 2018-08-22 NOTE — Progress Notes (Addendum)
Has seemed alert and oriented except to day,  making appropriate conversation and feeding self. Bp was 93/35 and 81/63 pulse 71.  Contacted Dr. Manuella Ghazi and he ordered to hold metoprolol.  IV removed and discharge appointment made with Dr. Laural Golden.  Secretary could get no answer at other offices. Voucher given for eliquis and home meds returned to patient.  Daughter has been in touch several times this morning by phone and will bring patient clothes and transport home. Transported to car by WC.  Discharge summary was explained in person to [facility], daughter.  Explained to start eliquis July 1 by phone.

## 2018-08-22 NOTE — TOC Transition Note (Signed)
Transition of Care [facility]) - CM/SW Discharge Note   Patient Details  Name: Jacqueline Hansen MRN: [id number] Date of Birth: [birth year]  Transition of Care [facility]) CM/SW Contact:  Shade Flood, LCSW Phone Number: 08/22/2018, 10:50 AM   Clinical Narrative:     Pt stable for dc today per MD. [facility] RN, PT, Aide, and SW ordered. Spoke with pt to discuss. She requests referral to [facility]. Referral given to [facility] at [place]. No other TOC needs identified.   Final next level of care: [place] Barriers to Discharge: Barriers Resolved   Patient Goals and CMS Choice Patient states their goals for this hospitalization and ongoing recovery are:: Home with family CMS Medicare.gov Compare Post Acute Care list provided to:: Patient Choice offered to / list presented to : Patient  Discharge Placement                       Discharge Plan and Services                          HH Arranged: RN, PT, Nurse's Aide, Social Work [facility] Agency: [facility] Date [place]: 08/22/18 Time [place]: 0211 Representative spoke with at [place]: Leda Quail  Social Determinants of Health (SDOH) Interventions     Readmission Risk Interventions No flowsheet data found.

## 2018-08-22 NOTE — Discharge Summary (Signed)
Physician Discharge Summary  Jacqueline Hansen QVZ:[id number] DOB: 22-Oct-1940 DOA: 08/18/2018  PCP: Keane Police, MD  Admit date: 08/18/2018  Discharge date: 08/22/2018  Admitted From:Home  Disposition:  Home  Recommendations for Outpatient Follow-up:  1. Follow up with PCP in 1 week to repeat blood pressure readings and monitor BMP/CBC 2. Follow-up with GI for repeat EGD in 3 months to document healing 3. Follow-up with cardiology in the next 2 to 3 weeks 4. Avoid further NSAIDs or Goody powders and remain on home pain medications as previously prescribed for headaches 5. Follow-up with neurology for frequent headaches with 2-3 episodes per week 6. Remain on midodrine as prescribed for blood pressure support and avoid other antihypertensives and follow-up with PCP 7. Continue on daily Protonix 8. Continue on Mycostatin as prescribed for next 10 days 9. Hold aspirin and Plavix and start Eliquis as prescribed on 7/1.  Home Health: Yes, set up with RN, CSW, home health aide, and PT.  Family members and patient did not want any facility placement or inpatient rehabilitation.  Equipment/Devices:None  Discharge Condition:Stable  CODE STATUS: Full  Diet recommendation: Heart Healthy/Carb Modified  Brief/Interim Summary: This is a 78 year old female with multiple comorbidities including COPD, GERD, epilepsy, prior PE, type 2 diabetes, and obesity who was admitted on 08/18/2018 with upper GI bleed and underwent EGD with findings of large gastric ulcer and was on IV Protonix.  Her diet has been advanced and she has no longer had any nausea or vomiting and has tolerated her diet well on Reglan IV.  It is thought she may have some element of gastroparesis.  Additionally, it appears that she has persistent esophageal candidiasis for which she has been started on Mycostatin once again after an initial treatment.  She appears to otherwise be doing well and also had some AKI which was treated with IV  fluid hydration.  She was also noted to have new onset paroxysmal atrial fibrillation which has now converted to sinus rhythm.  She was seen by cardiology and was noted to have 2D echocardiogram done on 07/2018 with no significant findings at that time.  This was performed at [facility] and LVEF was noted to be 65 to 70% and she was noted to have some mild LVH.  She did have some prolonged QTC on admission in the setting of hypokalemia and hypo-magnesium which has now improved.  She will otherwise be discharged with oral Protonix daily as well as Mycostatin for 10 days and Reglan to take with meals and at bedtime for suspected gastroparesis.  She will require repeat EGD in the next 3 months to document healing of her gastric ulcer.  She has been recommended to follow-up with neurology in the next 2 to 3 weeks to address her frequent headaches for which she was taking Goody powders and NSAIDs.  She does have tramadol and Norco at home that she has been recommended to use instead.  Finally, she is always noted to have soft blood pressure readings per her daughter who I spoke with on the phone on the day of discharge.  I have increased her Midrin dose to 10 mg 3 times daily and held her blood pressure medications.  She will need close follow-up with her PCP to ensure that blood pressures are improved and stable and she will also require repeat BMP and CBC at that time to ensure that kidney function and hemoglobin levels have remained stable.  Patient family members do not want to pursue any facility placement  for rehabilitation or nursing care, and therefore will be set up with home health PT, RN, home health aide, and CSW.  She may start Eliquis as prescribed on 7/1 per GI recommendations.  No other acute events noted during the course of this admission.  Discharge Diagnoses:  Principal Problem:   Sepsis due to undetermined organism [facility]) Active Problems:   COPD (chronic obstructive pulmonary disease) (HCC)   Type II  diabetes mellitus (HCC)   PAD (peripheral artery disease) (HCC)   Tobacco abuse   Hypercholesterolemia   GERD (gastroesophageal reflux disease)   Hypokalemia   Hypomagnesemia   AKI (acute kidney injury) (HCC)   Anemia   Prolonged QT interval   Acute esophagitis   Heme positive stool   Melena   Leukocytosis    Discharge Instructions  Discharge Instructions    Diet - low sodium heart healthy   Complete by: As directed    Increase activity slowly   Complete by: As directed      Allergies as of 08/22/2018      Reactions   Adhesive [tape] Other (See Comments)   Reaction:  Tears pts skin       Medication List    STOP taking these medications   amLODipine 5 MG tablet Commonly known as: NORVASC   aspirin EC 81 MG tablet   clopidogrel 75 MG tablet Commonly known as: PLAVIX   esomeprazole 40 MG capsule Commonly known as: NEXIUM   furosemide 20 MG tablet Commonly known as: Lasix   ibuprofen 400 MG tablet Commonly known as: ADVIL   potassium chloride 10 MEQ tablet Commonly known as: K-DUR   sulfamethoxazole-trimethoprim 800-160 MG tablet Commonly known as: BACTRIM DS     TAKE these medications   albuterol 108 (90 Base) MCG/ACT inhaler Commonly known as: VENTOLIN HFA Inhale 1-2 puffs into the lungs every 6 (six) hours as needed for wheezing or shortness of breath.   ALPRAZolam 0.5 MG tablet Commonly known as: XANAX Take 0.5 mg by mouth 3 (three) times daily as needed for anxiety.   apixaban 5 MG Tabs tablet Commonly known as: Eliquis Take 1 tablet (5 mg total) by mouth 2 (two) times daily for 30 days. Start taking on: September 05, 2018   [facility] 200-5 MCG/ACT Aero Generic drug: mometasone-formoterol Inhale 2 puffs into the lungs 2 (two) times daily.   DULoxetine 60 MG capsule Commonly known as: CYMBALTA Take 60 mg by mouth daily.   fluticasone 50 MCG/ACT nasal spray Commonly known as: FLONASE Place 2 sprays into both nostrils daily.    Fluticasone-Salmeterol 250-50 MCG/DOSE Aepb Commonly known as: ADVAIR Inhale 1 puff into the lungs daily as needed.   HYDROcodone-acetaminophen 5-325 MG tablet Commonly known as: NORCO/VICODIN Take 1 tablet by mouth every 6 (six) hours as needed for moderate pain.   insulin aspart 100 UNIT/ML injection Commonly known as: novoLOG Inject 0-10 Units into the skin 3 (three) times daily with meals. For glucose 150 to 200 use 2 units, 201 to 250 use 4 units, 251-300 use 6 units, 301-350 use 8 units, 351 or greater 10 units.   insulin glargine 100 UNIT/ML injection Commonly known as: LANTUS Inject 0.2 mLs (20 Units total) into the skin at bedtime.   ketoconazole 2 % cream Commonly known as: NIZORAL Apply 1 application topically daily as needed for irritation (APPLIED TO FEET).   linaclotide 145 MCG Caps capsule Commonly known as: LINZESS Take 145 mcg by mouth daily as needed (for constipation).   loperamide 2  MG tablet Commonly known as: IMODIUM A-D Take 2 mg by mouth 4 (four) times daily as needed for diarrhea or loose stools.   meclizine 12.5 MG tablet Commonly known as: ANTIVERT Take 12.5 mg by mouth 3 (three) times daily as needed for dizziness.   metFORMIN 500 MG tablet Commonly known as: GLUCOPHAGE Take 500 mg by mouth 2 (two) times daily with a meal.   metoCLOPramide 5 MG tablet Commonly known as: Reglan Take 1 tablet (5 mg total) by mouth 4 (four) times daily -  before meals and at bedtime.   midodrine 10 MG tablet Commonly known as: PROAMATINE Take 1 tablet (10 mg total) by mouth 3 (three) times daily for 30 days. What changed:   medication strength  how much to take   nystatin 100000 UNIT/ML suspension Commonly known as: MYCOSTATIN Take 5 mLs (500,000 Units total) by mouth 4 (four) times daily for 10 days.   pantoprazole 40 MG tablet Commonly known as: Protonix Take 1 tablet (40 mg total) by mouth daily.   rosuvastatin 40 MG tablet Commonly known as:  CRESTOR Take 40 mg by mouth at bedtime.   traMADol 50 MG tablet Commonly known as: Ultram Take 1 tablet (50 mg total) by mouth every 12 (twelve) hours as needed.      Follow-up Information    Keane Police, MD Follow up in 1 week(s).   Specialty: Family Medicine Contact information: [address] [place] [address] [phone]        Rogene Houston, MD Follow up in 3 month(s).   Specialty: Gastroenterology Contact information: [place], [address] [place] [address] [phone]        Arnoldo Lenis, MD Follow up in 2 week(s).   Specialty: Cardiology Contact information: [place] [address] [phone]        Phillips Odor, MD Follow up in 2 week(s).   Specialty: Neurology Contact information: [address] [place] [address] [phone]          Allergies  Allergen Reactions  . Adhesive [Tape] Other (See Comments)    Reaction:  Tears pts skin     Consultations:  GI  Cardiology   Procedures/Studies: Ct Abdomen Pelvis Wo Contrast  Result Date: 08/18/2018 CLINICAL DATA:  Diarrhea. EXAM: CT ABDOMEN AND PELVIS WITHOUT CONTRAST TECHNIQUE: Multidetector CT imaging of the abdomen and pelvis was performed following the standard protocol without IV contrast. COMPARISON:  None. FINDINGS: Lower chest: The heart size is normal. There are mitral valve calcifications. There are coronary artery calcifications. Hepatobiliary: No focal liver abnormality is seen. Status post cholecystectomy. No biliary dilatation. Pancreas: Unremarkable. No pancreatic ductal dilatation or surrounding inflammatory changes. Spleen: Normal in size without focal abnormality. 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 There is no hydronephrosis. There are no radiopaque obstructing kidney stones. The bladder is decompressed which limits evaluation. Stomach/Bowel: There is scattered colonic diverticula  without CT evidence of diverticulitis. There is a probable intraluminal lipoma within the descending colon. The appendix is not reliably identified, however there are no significant inflammatory changes in the right lower quadrant. The stomach is unremarkable. A duodenal diverticulum is noted. There is some fluid within the partially visualized esophagus. There is some esophageal wall thickening involving the distal esophagus. Vascular/Lymphatic: Aortic atherosclerosis. No enlarged abdominal or pelvic lymph nodes. Reproductive: Status post hysterectomy. No adnexal masses. Other: There is a fat containing periumbilical hernia. Musculoskeletal: There are degenerative  changes throughout the visualized thoracolumbar spine without evidence of a fracture. IMPRESSION: 1. The partially visualized esophagus is fluid filled with associated wall thickening. Findings can be seen in patients with esophagitis. 2. Scattered colonic diverticula without CT evidence of diverticulitis. 3. Status post cholecystectomy. The appendix is not reliably identified, however there are no significant inflammatory changes in the right lower quadrant. Aortic Atherosclerosis (ICD10-I70.0). Electronically Signed   By: Constance Holster M.D.   On: 08/18/2018 20:09   Dg Chest Port 1 View  Result Date: 08/18/2018 CLINICAL DATA:  Nausea, vomiting, and diarrhea. EXAM: PORTABLE CHEST 1 VIEW COMPARISON:  August 12, 2016 FINDINGS: There is a skin fold over the upper right chest with lung markings on both sides. No pneumothorax. The cardiomediastinal silhouette is stable. No pulmonary nodules, masses, or focal infiltrates. IMPRESSION: No active disease. Electronically Signed   By: Dorise Bullion III M.D   On: 08/18/2018 17:50     Discharge Exam: Vitals:   08/22/18 0916 08/22/18 0931  BP: (!) 96/35 (!) 81/63  Pulse: 71 76  Resp:    Temp:    SpO2:     Vitals:   08/22/18 0537 08/22/18 0810 08/22/18 0916 08/22/18 0931  BP:   (!) 96/35 (!) 81/63   Pulse:   71 76  Resp: 20     Temp: 98 F (36.7 C)     TempSrc: Oral     SpO2:  91%    Weight:      Height:        General: Pt is alert, awake, not in acute distress, obese Cardiovascular: RRR, S1/S2 +, no rubs, no gallops Respiratory: CTA bilaterally, no wheezing, no rhonchi Abdominal: Soft, NT, ND, bowel sounds + Extremities: no edema, no cyanosis    The results of significant diagnostics from this hospitalization (including imaging, microbiology, ancillary and laboratory) are listed below for reference.     Microbiology: Recent Results (from the past 240 hour(s))  Urine culture     Status: None   Collection Time: 08/18/18  6:40 PM   Specimen: Urine, Catheterized  Result Value Ref Range Status   Specimen Description   Final    URINE, CATHETERIZED Performed at [facility], [address] [place], [place] [address]    Special Requests   Final    NONE Performed at [facility], [address], [place], [place] [address]    Culture   Final    NO GROWTH Performed at [facility], [place] [address], [place], [place] [address]    Report Status 08/20/2018 FINAL  Final  SARS Coronavirus 2 (CEPHEID - Performed in [place] hospital lab), Hosp Order     Status: None   Collection Time: 08/18/18  7:30 PM   Specimen: Nasopharyngeal Swab  Result Value Ref Range Status   SARS Coronavirus 2 NEGATIVE NEGATIVE Final    Comment: (NOTE) If result is NEGATIVE SARS-CoV-2 target nucleic acids are NOT DETECTED. The SARS-CoV-2 RNA is generally detectable in upper and lower  respiratory specimens during the acute phase of infection. The lowest  concentration of SARS-CoV-2 viral copies this assay can detect is 250  copies / mL. A negative result does not preclude SARS-CoV-2 infection  and should not be used as the sole basis for treatment or other  patient management decisions.  A negative result may occur with  improper specimen collection / handling, submission of specimen other   than nasopharyngeal swab, presence of viral mutation(s) within the  areas targeted by  this assay, and inadequate number of viral copies  (<250 copies / mL). A negative result must be combined with clinical  observations, patient history, and epidemiological information. If result is POSITIVE SARS-CoV-2 target nucleic acids are DETECTED. The SARS-CoV-2 RNA is generally detectable in upper and lower  respiratory specimens dur ing the acute phase of infection.  Positive  results are indicative of active infection with SARS-CoV-2.  Clinical  correlation with patient history and other diagnostic information is  necessary to determine patient infection status.  Positive results do  not rule out bacterial infection or co-infection with other viruses. If result is PRESUMPTIVE POSTIVE SARS-CoV-2 nucleic acids MAY BE PRESENT.   A presumptive positive result was obtained on the submitted specimen  and confirmed on repeat testing.  While 2019 novel coronavirus  (SARS-CoV-2) nucleic acids may be present in the submitted sample  additional confirmatory testing may be necessary for epidemiological  and / or clinical management purposes  to differentiate between  SARS-CoV-2 and other Sarbecovirus currently known to infect humans.  If clinically indicated additional testing with an alternate test  methodology [phone]) is advised. The SARS-CoV-2 RNA is generally  detectable in upper and lower respiratory sp ecimens during the acute  phase of infection. The expected result is Negative. Fact Sheet for Patients:  StrictlyIdeas.no Fact Sheet for Healthcare Providers: BankingDealers.co.za This test is not yet approved or cleared by the [place] FDA and has been authorized for detection and/or diagnosis of SARS-CoV-2 by FDA under an Emergency Use Authorization (EUA).  This EUA will remain in effect (meaning this test can be used) for the duration of  the COVID-19 declaration under Section 564(b)(1) of the Act, 21 U.S.C. section 360bbb-3(b)(1), unless the authorization is terminated or revoked sooner. Performed at [facility], [address], [place], [place] [address]   Culture, blood (x 2)     Status: None (Preliminary result)   Collection Time: 08/18/18  8:08 PM   Specimen: BLOOD LEFT ARM  Result Value Ref Range Status   Specimen Description BLOOD LEFT ARM  Final   Special Requests   Final    BOTTLES DRAWN AEROBIC AND ANAEROBIC Blood Culture adequate volume   Culture   Final    NO GROWTH 4 DAYS Performed at [facility], [address] [place], [place] [address]    Report Status PENDING  Incomplete  Culture, blood (x 2)     Status: None (Preliminary result)   Collection Time: 08/18/18  9:18 PM   Specimen: Left Antecubital; Blood  Result Value Ref Range Status   Specimen Description LEFT ANTECUBITAL  Final   Special Requests   Final    BOTTLES DRAWN AEROBIC AND ANAEROBIC Blood Culture adequate volume   Culture   Final    NO GROWTH 4 DAYS Performed at [facility], [address] [place], [place] [address]    Report Status PENDING  Incomplete  MRSA PCR Screening     Status: None   Collection Time: 08/18/18  9:47 PM   Specimen: Nasal Mucosa; Nasopharyngeal  Result Value Ref Range Status   MRSA by PCR NEGATIVE NEGATIVE Final    Comment:        The GeneXpert MRSA Assay (FDA approved for NASAL specimens only), is one component of a comprehensive MRSA colonization surveillance program. It is not intended to diagnose MRSA infection nor to guide or monitor treatment for MRSA infections. Performed at [facility], [address], [place], [place] [address]   Jewett prep  Status: None   Collection Time: 08/20/18  4:05 PM   Specimen: Esophagus; GI  Result Value Ref Range Status   Specimen Description ESOPHAGUS  Final   Special Requests NONE  Final   KOH Prep   Final    RARE YEAST Performed at [facility], [address] [place], [place] [address]    Report Status 08/20/2018 FINAL  Final     Labs: BNP (last 3 results) No results for input(s): BNP in the last 8760 hours. Basic Metabolic Panel: Recent Labs  Lab 08/18/18 1806 08/18/18 1807 08/19/18 0444 08/20/18 0409 08/21/18 0446 08/22/18 0447  NA 134*  --  134* 136 137 137  K 3.0*  --  3.3* 3.0* 3.5 3.1*  CL 80*  --  88* 91* 95* 96*  CO2 29  --  30 31 33* 31  GLUCOSE 261*  --  218* 143* 211* 152*  BUN 54*  --  66* 48* 23 15  CREATININE 1.83*  --  1.51* 1.19* 0.83 0.66  CALCIUM 7.5*  --  6.8* 7.5* 8.0* 8.4*  MG  --  1.1* 2.5* 2.3 2.2 2.1   Liver Function Tests: Recent Labs  Lab 08/18/18 1806 08/19/18 0444 08/20/18 0409 08/21/18 0446 08/22/18 0447  AST 18 16 15 15  14*  ALT 14 12 10 11 11   ALKPHOS 61 50 50 45 46  BILITOT 1.0 0.7 0.4 0.4 0.6  PROT 6.2* 5.3* 5.5* 5.6* 5.6*  ALBUMIN 2.7* 2.4* 2.3* 2.5* 2.5*   Recent Labs  Lab 08/18/18 1806  LIPASE 22   No results for input(s): AMMONIA in the last 168 hours. CBC: Recent Labs  Lab 08/18/18 1806 08/19/18 0444 08/20/18 0409 08/21/18 0446 08/22/18 0447  WBC 25.9* 25.4* 14.9* 12.5* 11.4*  NEUTROABS 23.8* 21.2* 10.4* 9.2* 7.9*  HGB 11.0* 9.4* 7.7* 7.7* 7.6*  HCT 32.8* 29.1* 23.0* 23.6* 23.7*  MCV 83.2 87.7 83.9 86.8 87.5  PLT 367 267 224 200 196   Cardiac Enzymes: Recent Labs  Lab 08/18/18 1806  TROPONINI 0.03*   BNP: Invalid input(s): POCBNP CBG: Recent Labs  Lab 08/21/18 1110 08/21/18 1616 08/21/18 2114 08/22/18 0306 08/22/18 0746  GLUCAP 146* 141* 145* 142* 121*   D-Dimer No results for input(s): DDIMER in the last 72 hours. Hgb A1c No results for input(s): HGBA1C in the last 72 hours. Lipid Profile No results for input(s): CHOL, HDL, LDLCALC, TRIG, CHOLHDL, LDLDIRECT in the last 72 hours. Thyroid function studies No results for input(s): TSH, T4TOTAL, T3FREE, THYROIDAB in the last 72 hours.  Invalid input(s): FREET3 Anemia work up No results for  input(s): VITAMINB12, FOLATE, FERRITIN, TIBC, IRON, RETICCTPCT in the last 72 hours. Urinalysis    Component Value Date/Time   COLORURINE YELLOW 08/18/2018 1840   APPEARANCEUR HAZY (A) 08/18/2018 1840   LABSPEC 1.015 08/18/2018 1840   PHURINE 5.0 08/18/2018 1840   GLUCOSEU NEGATIVE 08/18/2018 1840   HGBUR NEGATIVE 08/18/2018 1840   BILIRUBINUR NEGATIVE 08/18/2018 1840   KETONESUR NEGATIVE 08/18/2018 1840   PROTEINUR NEGATIVE 08/18/2018 1840   NITRITE NEGATIVE 08/18/2018 1840   LEUKOCYTESUR NEGATIVE 08/18/2018 1840   Sepsis Labs Invalid input(s): PROCALCITONIN,  WBC,  LACTICIDVEN Microbiology Recent Results (from the past 240 hour(s))  Urine culture     Status: None   Collection Time: 08/18/18  6:40 PM   Specimen: Urine, Catheterized  Result Value Ref Range Status   Specimen Description   Final    URINE, CATHETERIZED Performed at [facility], [address],  [place], [place] [address]    Special Requests   Final    NONE Performed at [facility], [address] [place], [place] [address]    Culture   Final    NO GROWTH Performed at [facility], [place] [address] [place], [place] [address]    Report Status 08/20/2018 FINAL  Final  SARS Coronavirus 2 (CEPHEID - Performed in [place] hospital lab), Hosp Order     Status: None   Collection Time: 08/18/18  7:30 PM   Specimen: Nasopharyngeal Swab  Result Value Ref Range Status   SARS Coronavirus 2 NEGATIVE NEGATIVE Final    Comment: (NOTE) If result is NEGATIVE SARS-CoV-2 target nucleic acids are NOT DETECTED. The SARS-CoV-2 RNA is generally detectable in upper and lower  respiratory specimens during the acute phase of infection. The lowest  concentration of SARS-CoV-2 viral copies this assay can detect is 250  copies / mL. A negative result does not preclude SARS-CoV-2 infection  and should not be used as the sole basis for treatment or other  patient management decisions.  A negative result may occur with   improper specimen collection / handling, submission of specimen other  than nasopharyngeal swab, presence of viral mutation(s) within the  areas targeted by this assay, and inadequate number of viral copies  (<250 copies / mL). A negative result must be combined with clinical  observations, patient history, and epidemiological information. If result is POSITIVE SARS-CoV-2 target nucleic acids are DETECTED. The SARS-CoV-2 RNA is generally detectable in upper and lower  respiratory specimens dur ing the acute phase of infection.  Positive  results are indicative of active infection with SARS-CoV-2.  Clinical  correlation with patient history and other diagnostic information is  necessary to determine patient infection status.  Positive results do  not rule out bacterial infection or co-infection with other viruses. If result is PRESUMPTIVE POSTIVE SARS-CoV-2 nucleic acids MAY BE PRESENT.   A presumptive positive result was obtained on the submitted specimen  and confirmed on repeat testing.  While 2019 novel coronavirus  (SARS-CoV-2) nucleic acids may be present in the submitted sample  additional confirmatory testing may be necessary for epidemiological  and / or clinical management purposes  to differentiate between  SARS-CoV-2 and other Sarbecovirus currently known to infect humans.  If clinically indicated additional testing with an alternate test  methodology [phone] is advised. The SARS-CoV-2 RNA is generally  detectable in upper and lower respiratory sp ecimens during the acute  phase of infection. The expected result is Negative. Fact Sheet for Patients:  StrictlyIdeas.no Fact Sheet for Healthcare Providers: BankingDealers.co.za This test is not yet approved or cleared by the [place] FDA and has been authorized for detection and/or diagnosis of SARS-CoV-2 by FDA under an Emergency Use Authorization (EUA).  This EUA will  remain in effect (meaning this test can be used) for the duration of the COVID-19 declaration under Section 564(b)(1) of the Act, 21 U.S.C. section 360bbb-3(b)(1), unless the authorization is terminated or revoked sooner. Performed at [facility], [address], [place], [place] [address]   Culture, blood (x 2)     Status: None (Preliminary result)   Collection Time: 08/18/18  8:08 PM   Specimen: BLOOD LEFT ARM  Result Value Ref Range Status   Specimen Description BLOOD LEFT ARM  Final   Special Requests   Final    BOTTLES DRAWN AEROBIC AND ANAEROBIC Blood Culture adequate volume   Culture   Final  NO GROWTH 4 DAYS Performed at [facility], [address] [place], [place] [address]    Report Status PENDING  Incomplete  Culture, blood (x 2)     Status: None (Preliminary result)   Collection Time: 08/18/18  9:18 PM   Specimen: Left Antecubital; Blood  Result Value Ref Range Status   Specimen Description LEFT ANTECUBITAL  Final   Special Requests   Final    BOTTLES DRAWN AEROBIC AND ANAEROBIC Blood Culture adequate volume   Culture   Final    NO GROWTH 4 DAYS Performed at [facility], [address], [place], [place] [address]    Report Status PENDING  Incomplete  MRSA PCR Screening     Status: None   Collection Time: 08/18/18  9:47 PM   Specimen: Nasal Mucosa; Nasopharyngeal  Result Value Ref Range Status   MRSA by PCR NEGATIVE NEGATIVE Final    Comment:        The GeneXpert MRSA Assay (FDA approved for NASAL specimens only), is one component of a comprehensive MRSA colonization surveillance program. It is not intended to diagnose MRSA infection nor to guide or monitor treatment for MRSA infections. Performed at [facility], [address], [place], [place] [address]   [place] prep     Status: None   Collection Time: 08/20/18  4:05 PM   Specimen: Esophagus; GI  Result Value Ref Range Status   Specimen Description ESOPHAGUS  Final   Special Requests NONE  Final    KOH Prep   Final    RARE YEAST Performed at [facility], [address] [place], [place] [address]    Report Status 08/20/2018 FINAL  Final     Time coordinating discharge: 35 minutes  SIGNED:   Rodena Goldmann, DO [facility] 08/22/2018, 10:18 AM   If 7PM-7AM, please contact night-coverage [email or web address] Password [email or web address]

## 2018-08-23 ENCOUNTER — Encounter (HOSPITAL_COMMUNITY): Payer: Self-pay | Admitting: Internal Medicine

## 2018-08-23 LAB — CULTURE, BLOOD (ROUTINE X 2)
Culture: NO GROWTH
Culture: NO GROWTH
Special Requests: ADEQUATE
Special Requests: ADEQUATE

## 2018-09-11 ENCOUNTER — Encounter: Payer: Self-pay | Admitting: Student

## 2018-09-11 ENCOUNTER — Ambulatory Visit: Payer: Medicare HMO | Admitting: Student

## 2018-09-11 NOTE — Progress Notes (Deleted)
Cardiology Office Note    Date:  09/11/2018   ID:  Jacqueline Hansen, DOB [birth year], MRN [id number]  PCP:  Keane Police, MD  Cardiologist: Carlyle Dolly, MD    No chief complaint on file.   History of Present Illness:    Jacqueline Hansen is a 78 y.o. female with past medical history of HTN, HLD, IDDM, COPD, and prior PE following cholecystectomy who presents to the office today for hospital follow-up.   She was most recent admitted to [facility] from 6/13 - 08/22/2018 for evaluation of abdominal discomfort, nausea, and weakness found to have an upper GIB and EGD showing a large gastric ulcer. During admission, she was noted to have a brief episode of atrial fibrillation and she spontaneously converted back to NSR within 6 hours. Was evaluated by Dr. Harl Bowie and not felt to be a candidate for anticoagulation due to her current GIB and gastric ulcer. Was recommended in the future to have her on Eliquis along and discontinue ASA and Plavix once able to from [employer] Appears this was reviewed with Dr. Laural Golden prior to discharge and she it was listed to start Eliquis on 09/05/2018 by review of the discharge summary.     Past Medical History:  Diagnosis Date  . Anxiety   . Arthritis    "joints" (09/08/2014)  . Asthma   . CHF (congestive heart failure) ([place])   . Chronic bronchitis ([place])    "get it q yr"  . COPD (chronic obstructive pulmonary disease) ([place])   . Depression   . Fibromyalgia   . Gastric ulcer   . GERD (gastroesophageal reflux disease)   . 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 History of blood transfusion ?2015   "cause I was throwing it up"  . History of hiatal hernia   . Hypercholesterolemia   . 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 On home oxygen therapy    "2L ususally at night time" (09/08/2014)  . Pulmonary embolism ([place])    "when I had my gallbladder taken out"  . Type II diabetes mellitus ([place])     Past Surgical History:  Procedure  Laterality Date  . ABDOMINAL HYSTERECTOMY    . BIOPSY  08/20/2018   Procedure: BIOPSY;  Surgeon: Rogene Houston, MD;  [facility];  Service: Endoscopy;;  . CARPAL TUNNEL RELEASE Bilateral   . CATARACT EXTRACTION W/ INTRAOCULAR LENS  IMPLANT, BILATERAL Bilateral   . COLONOSCOPY N/A 09/09/2014   Procedure: COLONOSCOPY;  Surgeon: Irene Shipper, MD;  Location: [place];  Service: Endoscopy;  Laterality: N/A;  . ESOPHAGOGASTRODUODENOSCOPY N/A 08/20/2018   Procedure: ESOPHAGOGASTRODUODENOSCOPY (EGD);  Surgeon: Rogene Houston, MD;  [facility];  Service: Endoscopy;  Laterality: N/A;  . EXCISIONAL HEMORRHOIDECTOMY    . INCISION AND DRAINAGE ABSCESS Right 08/09/2016   Procedure: INCISION AND DRAINAGE ABSCESS;  Surgeon: Aviva Signs, MD;  Location: [facility];  Service: General;  Laterality: Right;  . LAPAROSCOPIC CHOLECYSTECTOMY    . MELANOMA EXCISION     "off my nose"  . TUBAL LIGATION      Current Medications: Outpatient Medications Prior to Visit  Medication Sig Dispense Refill  . albuterol (PROVENTIL HFA;VENTOLIN HFA) 108 (90 Base) MCG/ACT inhaler Inhale 1-2 puffs into the lungs every 6 (six) hours as needed for wheezing or shortness of breath.    . ALPRAZolam (XANAX) 0.5 MG tablet Take 0.5 mg by mouth 3 (three) times daily  as needed for anxiety.     Marland Kitchen apixaban (ELIQUIS) 5 MG TABS tablet Take 1 tablet (5 mg total) by mouth 2 (two) times daily for 30 days. 60 tablet 3  . DULoxetine (CYMBALTA) 60 MG capsule Take 60 mg by mouth daily.     . fluticasone (FLONASE) 50 MCG/ACT nasal spray Place 2 sprays into both nostrils daily.    . Fluticasone-Salmeterol (ADVAIR) 250-50 MCG/DOSE AEPB Inhale 1 puff into the lungs daily as needed.     Marland Kitchen HYDROcodone-acetaminophen (NORCO/VICODIN) 5-325 MG tablet Take 1 tablet by mouth every 6 (six) hours as needed for moderate pain. 10 tablet 0  . insulin aspart (NOVOLOG) 100 UNIT/ML injection Inject 0-10 Units into the skin 3 (three) times daily  with meals. For glucose 150 to 200 use 2 units, 201 to 250 use 4 units, 251-300 use 6 units, 301-350 use 8 units, 351 or greater 10 units. 10 mL 11  . insulin glargine (LANTUS) 100 UNIT/ML injection Inject 0.2 mLs (20 Units total) into the skin at bedtime. 10 mL 11  . ketoconazole (NIZORAL) 2 % cream Apply 1 application topically daily as needed for irritation (APPLIED TO FEET).     Marland Kitchen linaclotide (LINZESS) 145 MCG CAPS capsule Take 145 mcg by mouth daily as needed (for constipation).    Marland Kitchen loperamide (IMODIUM A-D) 2 MG tablet Take 2 mg by mouth 4 (four) times daily as needed for diarrhea or loose stools.    . meclizine (ANTIVERT) 12.5 MG tablet Take 12.5 mg by mouth 3 (three) times daily as needed for dizziness.    . metFORMIN (GLUCOPHAGE) 500 MG tablet Take 500 mg by mouth 2 (two) times daily with a meal.     . metoCLOPramide (REGLAN) 5 MG tablet Take 1 tablet (5 mg total) by mouth 4 (four) times daily -  before meals and at bedtime. 90 tablet 1  . midodrine (PROAMATINE) 10 MG tablet Take 1 tablet (10 mg total) by mouth 3 (three) times daily for 30 days. 90 tablet 3  . mometasone-formoterol (DULERA) 200-5 MCG/ACT AERO Inhale 2 puffs into the lungs 2 (two) times daily.    . pantoprazole (PROTONIX) 40 MG tablet Take 1 tablet (40 mg total) by mouth daily. 30 tablet 1  . rosuvastatin (CRESTOR) 40 MG tablet Take 40 mg by mouth at bedtime.     . traMADol (ULTRAM) 50 MG tablet Take 1 tablet (50 mg total) by mouth every 12 (twelve) hours as needed. 15 tablet 0   No facility-administered medications prior to visit.      Allergies:   Adhesive [tape]   Social History   Socioeconomic History  . Marital status: Divorced    Spouse name: Not on file  . Number of children: Not on file  . Years of education: Not on file  . Highest education level: Not on file  Occupational History  . Not on file  Social Needs  . Financial resource strain: Not on file  . Food insecurity    Worry: Not on file     Inability: Not on file  . Transportation needs    Medical: Not on file    Non-medical: Not on file  Tobacco Use  . Smoking status: Current Every Day Smoker    Packs/day: 1.00    Years: 32.00    Pack years: 32.00    Types: Cigarettes  . Smokeless tobacco: Never Used  Substance and Sexual Activity  . Alcohol use: Not Currently    Comment: 09/08/2014 "  I drank a little bit a long time ago"  . Drug use: No  . Sexual activity: Not Currently  Lifestyle  . Physical activity    Days per week: Not on file    Minutes per session: Not on file  . Stress: Not on file  Relationships  . Social Herbalist on phone: Not on file    Gets together: Not on file    Attends religious service: Not on file    Active member of club or organization: Not on file    Attends meetings of clubs or organizations: Not on file    Relationship status: Not on file  Other Topics Concern  . Not on file  Social History Narrative  . Not on file     Family History:  The patient's ***family history includes AAA (abdominal aortic aneurysm) in her mother; Diabetes in her sister; Heart disease in her mother; Lung cancer in her sister.   Review of Systems:   Please see the history of present illness.     General:  No chills, fever, night sweats or weight changes.  Cardiovascular:  No chest pain, dyspnea on exertion, edema, orthopnea, palpitations, paroxysmal nocturnal dyspnea. Dermatological: No rash, lesions/masses Respiratory: No cough, dyspnea Urologic: No hematuria, dysuria Abdominal:   No nausea, vomiting, diarrhea, bright red blood per rectum, melena, or hematemesis Neurologic:  No visual changes, wkns, changes in mental status. All other systems reviewed and are otherwise negative except as noted above.   Physical Exam:    VS:  There were no vitals taken for this visit.   General: Well developed, well nourished,female appearing in no acute distress. Head: Normocephalic, atraumatic, sclera  non-icteric, no xanthomas, nares are without discharge.  Neck: No carotid bruits. JVD not elevated.  Lungs: Respirations regular and unlabored, without wheezes or rales.  Heart: ***Regular rate and rhythm. No S3 or S4.  No murmur, no rubs, or gallops appreciated. Abdomen: Soft, non-tender, non-distended with normoactive bowel sounds. No hepatomegaly. No rebound/guarding. No obvious abdominal masses. Msk:  Strength and tone appear normal for age. No joint deformities or effusions. Extremities: No clubbing or cyanosis. No edema.  Distal pedal pulses are 2+ bilaterally. Neuro: Alert and oriented X 3. Moves all extremities spontaneously. No focal deficits noted. Psych:  Responds to questions appropriately with a normal affect. Skin: No rashes or lesions noted  Wt Readings from Last 3 Encounters:  08/20/18 208 lb 1.8 oz (94.4 kg)  09/27/16 199 lb (90.3 kg)  09/13/16 200 lb (90.7 kg)        Studies/Labs Reviewed:   EKG:  EKG is*** ordered today.  The ekg ordered today demonstrates ***  Recent Labs: 08/18/2018: TSH 0.113 08/22/2018: ALT 11; BUN 15; Creatinine, Ser 0.66; Hemoglobin 7.6; Magnesium 2.1; Platelets 196; Potassium 3.1; Sodium 137   Lipid Panel No results found for: CHOL, TRIG, HDL, CHOLHDL, VLDL, LDLCALC, LDLDIRECT  Additional studies/ records that were reviewed today include:   Echocardiogram: 07/20/2018  Left ventricular hypertrophy - mild  Normal left ventricular systolic function, ejection fraction 65 to 70%  Normal right ventricular systolic function  Mitral annular calcification  Technically difficult study due to chest wall/lung interference  Assessment:    No diagnosis found.   Plan:   In order of problems listed above:  1. Paroxysmal Atrial Fibrillation - ***  2. HTN - ***  3. HLD - ***  4. IDDM - Hgb A1c 7.0 when checked in 08/2018. Followed by PCP.   5.  Recent GIB - ***    Medication Adjustments/Labs and Tests Ordered: Current  medicines are reviewed at length with the patient today.  Concerns regarding medicines are outlined above.  Medication changes, Labs and Tests ordered today are listed in the Patient Instructions below. There are no Patient Instructions on file for this visit.   Signed, Erma Heritage, PA-C  09/11/2018 7:36 AM    [place]. [address] [place], [place] [address] Phone: [phone] Fax: [phone]

## 2018-10-24 ENCOUNTER — Other Ambulatory Visit (INDEPENDENT_AMBULATORY_CARE_PROVIDER_SITE_OTHER): Payer: Self-pay | Admitting: Internal Medicine

## 2018-10-24 ENCOUNTER — Other Ambulatory Visit (INDEPENDENT_AMBULATORY_CARE_PROVIDER_SITE_OTHER): Payer: Self-pay | Admitting: *Deleted

## 2018-10-24 DIAGNOSIS — K209 Esophagitis, unspecified without bleeding: Secondary | ICD-10-CM

## 2018-10-24 DIAGNOSIS — K219 Gastro-esophageal reflux disease without esophagitis: Secondary | ICD-10-CM

## 2018-10-24 MED ORDER — PANTOPRAZOLE SODIUM 40 MG PO TBEC: 40.0000 mg | DELAYED_RELEASE_TABLET | Freq: Every day | ORAL | 5 refills | Status: DC

## 2018-11-26 ENCOUNTER — Other Ambulatory Visit (INDEPENDENT_AMBULATORY_CARE_PROVIDER_SITE_OTHER): Payer: Self-pay | Admitting: Internal Medicine

## 2018-11-27 ENCOUNTER — Ambulatory Visit (INDEPENDENT_AMBULATORY_CARE_PROVIDER_SITE_OTHER): Payer: Medicare HMO | Admitting: Internal Medicine

## 2018-12-03 NOTE — Telephone Encounter (Signed)
Patient needs a OV prior to additional refills.

## 2018-12-19 DIAGNOSIS — I639 Cerebral infarction, unspecified: Secondary | ICD-10-CM | POA: Diagnosis not present

## 2018-12-31 DIAGNOSIS — R296 Repeated falls: Secondary | ICD-10-CM | POA: Diagnosis not present

## 2018-12-31 DIAGNOSIS — M6281 Muscle weakness (generalized): Secondary | ICD-10-CM | POA: Diagnosis not present

## 2018-12-31 DIAGNOSIS — M545 Low back pain: Secondary | ICD-10-CM | POA: Diagnosis not present

## 2018-12-31 DIAGNOSIS — I10 Essential (primary) hypertension: Secondary | ICD-10-CM | POA: Diagnosis not present

## 2018-12-31 DIAGNOSIS — N390 Urinary tract infection, site not specified: Secondary | ICD-10-CM | POA: Diagnosis not present

## 2018-12-31 DIAGNOSIS — M25562 Pain in left knee: Secondary | ICD-10-CM | POA: Diagnosis not present

## 2018-12-31 DIAGNOSIS — Z79899 Other long term (current) drug therapy: Secondary | ICD-10-CM | POA: Diagnosis not present

## 2018-12-31 DIAGNOSIS — R2681 Unsteadiness on feet: Secondary | ICD-10-CM | POA: Diagnosis not present

## 2018-12-31 DIAGNOSIS — J441 Chronic obstructive pulmonary disease with (acute) exacerbation: Secondary | ICD-10-CM | POA: Diagnosis not present

## 2018-12-31 DIAGNOSIS — E1165 Type 2 diabetes mellitus with hyperglycemia: Secondary | ICD-10-CM | POA: Diagnosis not present

## 2018-12-31 DIAGNOSIS — M542 Cervicalgia: Secondary | ICD-10-CM | POA: Diagnosis not present

## 2019-01-17 ENCOUNTER — Ambulatory Visit (INDEPENDENT_AMBULATORY_CARE_PROVIDER_SITE_OTHER): Payer: Medicare HMO | Admitting: Nurse Practitioner

## 2019-01-19 DIAGNOSIS — I639 Cerebral infarction, unspecified: Secondary | ICD-10-CM | POA: Diagnosis not present

## 2019-01-28 ENCOUNTER — Other Ambulatory Visit (INDEPENDENT_AMBULATORY_CARE_PROVIDER_SITE_OTHER): Payer: Self-pay | Admitting: Internal Medicine

## 2019-01-30 ENCOUNTER — Other Ambulatory Visit (INDEPENDENT_AMBULATORY_CARE_PROVIDER_SITE_OTHER): Payer: Self-pay | Admitting: Nurse Practitioner

## 2019-02-11 DIAGNOSIS — E785 Hyperlipidemia, unspecified: Secondary | ICD-10-CM | POA: Diagnosis not present

## 2019-02-11 DIAGNOSIS — I509 Heart failure, unspecified: Secondary | ICD-10-CM | POA: Diagnosis not present

## 2019-02-11 DIAGNOSIS — Z7901 Long term (current) use of anticoagulants: Secondary | ICD-10-CM | POA: Diagnosis not present

## 2019-02-11 DIAGNOSIS — Z8673 Personal history of transient ischemic attack (TIA), and cerebral infarction without residual deficits: Secondary | ICD-10-CM | POA: Diagnosis not present

## 2019-02-11 DIAGNOSIS — E118 Type 2 diabetes mellitus with unspecified complications: Secondary | ICD-10-CM | POA: Diagnosis not present

## 2019-02-11 DIAGNOSIS — K219 Gastro-esophageal reflux disease without esophagitis: Secondary | ICD-10-CM | POA: Diagnosis not present

## 2019-02-11 DIAGNOSIS — I10 Essential (primary) hypertension: Secondary | ICD-10-CM | POA: Diagnosis not present

## 2019-02-11 DIAGNOSIS — Z23 Encounter for immunization: Secondary | ICD-10-CM | POA: Diagnosis not present

## 2019-02-11 DIAGNOSIS — R300 Dysuria: Secondary | ICD-10-CM | POA: Diagnosis not present

## 2019-02-11 DIAGNOSIS — K921 Melena: Secondary | ICD-10-CM | POA: Diagnosis not present

## 2019-02-18 DIAGNOSIS — I639 Cerebral infarction, unspecified: Secondary | ICD-10-CM | POA: Diagnosis not present

## 2019-03-11 ENCOUNTER — Telehealth (INDEPENDENT_AMBULATORY_CARE_PROVIDER_SITE_OTHER): Payer: Self-pay | Admitting: Gastroenterology

## 2019-03-11 MED ORDER — METOCLOPRAMIDE HCL 5 MG PO TABS: ORAL_TABLET | ORAL | 0 refills | Status: DC

## 2019-03-11 NOTE — Telephone Encounter (Signed)
Received electronic refill request from patient for reglan.  On chart review patient was last seen in the hospital June 2020 and recommended to have a 3-week follow-up.   We will refill for 1 month but I would like to see patient in the office for further refills.

## 2019-03-21 DIAGNOSIS — I509 Heart failure, unspecified: Secondary | ICD-10-CM | POA: Diagnosis not present

## 2019-03-21 DIAGNOSIS — Z7901 Long term (current) use of anticoagulants: Secondary | ICD-10-CM | POA: Diagnosis not present

## 2019-03-21 DIAGNOSIS — Z8673 Personal history of transient ischemic attack (TIA), and cerebral infarction without residual deficits: Secondary | ICD-10-CM | POA: Diagnosis not present

## 2019-03-21 DIAGNOSIS — I10 Essential (primary) hypertension: Secondary | ICD-10-CM | POA: Diagnosis not present

## 2019-03-21 DIAGNOSIS — I639 Cerebral infarction, unspecified: Secondary | ICD-10-CM | POA: Diagnosis not present

## 2019-03-21 DIAGNOSIS — M545 Low back pain: Secondary | ICD-10-CM | POA: Diagnosis not present

## 2019-03-21 DIAGNOSIS — F419 Anxiety disorder, unspecified: Secondary | ICD-10-CM | POA: Diagnosis not present

## 2019-03-21 DIAGNOSIS — E118 Type 2 diabetes mellitus with unspecified complications: Secondary | ICD-10-CM | POA: Diagnosis not present

## 2019-03-21 DIAGNOSIS — Z79891 Long term (current) use of opiate analgesic: Secondary | ICD-10-CM | POA: Diagnosis not present

## 2019-04-08 ENCOUNTER — Other Ambulatory Visit (INDEPENDENT_AMBULATORY_CARE_PROVIDER_SITE_OTHER): Payer: Self-pay | Admitting: Internal Medicine

## 2019-04-08 ENCOUNTER — Other Ambulatory Visit (INDEPENDENT_AMBULATORY_CARE_PROVIDER_SITE_OTHER): Payer: Self-pay | Admitting: Gastroenterology

## 2019-04-08 DIAGNOSIS — K219 Gastro-esophageal reflux disease without esophagitis: Secondary | ICD-10-CM

## 2019-04-08 DIAGNOSIS — K209 Esophagitis, unspecified without bleeding: Secondary | ICD-10-CM

## 2019-04-15 ENCOUNTER — Ambulatory Visit (INDEPENDENT_AMBULATORY_CARE_PROVIDER_SITE_OTHER): Payer: Medicare HMO | Admitting: Gastroenterology

## 2019-04-19 ENCOUNTER — Telehealth: Payer: Self-pay | Admitting: Cardiology

## 2019-04-19 NOTE — Telephone Encounter (Signed)

## 2019-04-21 DIAGNOSIS — I639 Cerebral infarction, unspecified: Secondary | ICD-10-CM | POA: Diagnosis not present

## 2019-04-22 ENCOUNTER — Other Ambulatory Visit (INDEPENDENT_AMBULATORY_CARE_PROVIDER_SITE_OTHER): Payer: Self-pay | Admitting: Gastroenterology

## 2019-04-24 ENCOUNTER — Telehealth: Payer: Self-pay | Admitting: Licensed Clinical Social Worker

## 2019-04-24 ENCOUNTER — Encounter: Payer: Self-pay | Admitting: Cardiology

## 2019-04-24 ENCOUNTER — Telehealth (INDEPENDENT_AMBULATORY_CARE_PROVIDER_SITE_OTHER): Payer: Medicare HMO | Admitting: Cardiology

## 2019-04-24 VITALS — Ht 64.0 in | Wt 200.0 lb

## 2019-04-24 DIAGNOSIS — I5032 Chronic diastolic (congestive) heart failure: Secondary | ICD-10-CM

## 2019-04-24 DIAGNOSIS — I4891 Unspecified atrial fibrillation: Secondary | ICD-10-CM

## 2019-04-24 NOTE — Telephone Encounter (Signed)
CSW referred to assist patient with obtaining a BP cuff. CSW contacted patient to inform cuff will be delivered to home. Patient grateful for support and assistance. CSW available as needed. Jackie Vianca Bracher, LCSW, CCSW-MCS [phone]  

## 2019-04-24 NOTE — Patient Instructions (Signed)
Medication Instructions:  Your physician recommends that you continue on your current medications as directed. Please refer to the Current Medication list given to you today.   Labwork: none  Testing/Procedures: none  Follow-Up: Your physician recommends that you schedule a follow-up appointment in: NURSE VISIT NEXT WEEK FOR EKG/VITALS CHECK  Your physician wants you to follow-up in: 6 MONTHS .  You will receive a reminder letter in the mail two months in advance. If you don't receive a letter, please call our office to schedule the follow-up appointment.    Any Other Special Instructions Will Be Listed Below (If Applicable).     If you need a refill on your cardiac medications before your next appointment, please call your pharmacy.

## 2019-04-24 NOTE — Progress Notes (Signed)
Virtual Visit via Telephone Note   This visit type was conducted due to national recommendations for restrictions regarding the COVID-19 Pandemic (e.g. social distancing) in an effort to limit this patient's exposure and mitigate transmission in our community.  Due to her co-morbid illnesses, this patient is at least at moderate risk for complications without adequate follow up.  This format is felt to be most appropriate for this patient at this time.  The patient did not have access to video technology/had technical difficulties with video requiring transitioning to audio format only (telephone).  All issues noted in this document were discussed and addressed.  No physical exam could be performed with this format.  Please refer to the patient's chart for her  consent to telehealth for [facility].   Date:  04/24/2019   ID:  Jacqueline Hansen, DOB August 05, 1940, MRN [id number]  Patient Location: Home Provider Location: Office  PCP:  [facility]  Cardiologist:  Carlyle Dolly, MD  Electrophysiologist:  None   Evaluation Performed:  Follow-Up Visit  Chief Complaint:  Follow up   History of Present Illness:    Jacqueline Hansen is a 79 y.o. female seen today for follow up of the following medical problems.    1. Afib - isolated episode during 08/2018 admssion when admitted with  GI bleeding and esophagitis - was able ot be started on eliquis, has not required av nodal agent.   - no recent bleeding  - can have palpitations at times.     2. Chronic diastolic HF - chronic RLE edema uncanged  3. History of PE  4. History of GI bleeding - admission 08/2018 with esophagitis, upper GI bleed - 08/2018 EGD ersovie gastricitis, +ulcer - was on DAPT for unclear indicaoitn at the time, has history of PAD and CVA   5. Low bp - had been on midodrine at 08/2018 discharge - no longer taking, she is not sure of the histoyr.   6. COPD - +cough, +wheezing - has pcp appt next week  7/.  History of prior CVA   The patient does not have symptoms concerning for COVID-19 infection (fever, chills, cough, or new shortness of breath).    Past Medical History:  Diagnosis Date  . Anxiety   . Arthritis    "joints" (09/08/2014)  . Asthma   . CHF (congestive heart failure) ([place])   . Chronic bronchitis ([place])    "get it q yr"  . COPD (chronic obstructive pulmonary disease) ([place])   . Depression   . Fibromyalgia   . Gastric ulcer   . GERD (gastroesophageal reflux disease)   . 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 History of blood transfusion ?2015   "cause I was throwing it up"  . History of hiatal hernia   . Hypercholesterolemia   . 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 On home oxygen therapy    "2L ususally at night time" (09/08/2014)  . Pulmonary embolism ([place])    "when I had my gallbladder taken out"  . Type II diabetes mellitus ([place])    Past Surgical History:  Procedure Laterality Date  . ABDOMINAL HYSTERECTOMY    . BIOPSY  08/20/2018   Procedure: BIOPSY;  Surgeon: Rogene Houston, MD;  [facility];  Service: Endoscopy;;  . CARPAL TUNNEL RELEASE Bilateral   . CATARACT EXTRACTION W/ INTRAOCULAR LENS  IMPLANT, BILATERAL Bilateral   . COLONOSCOPY N/A 09/09/2014  Procedure: COLONOSCOPY;  Surgeon: Irene Shipper, MD;  Location: [facility];  Service: Endoscopy;  Laterality: N/A;  . ESOPHAGOGASTRODUODENOSCOPY N/A 08/20/2018   Procedure: ESOPHAGOGASTRODUODENOSCOPY (EGD);  Surgeon: Rogene Houston, MD;  [facility];  Service: Endoscopy;  Laterality: N/A;  . EXCISIONAL HEMORRHOIDECTOMY    . INCISION AND DRAINAGE ABSCESS Right 08/09/2016   Procedure: INCISION AND DRAINAGE ABSCESS;  Surgeon: Aviva Signs, MD;  Location[facility];  Service: General;  Laterality: Right;  . LAPAROSCOPIC CHOLECYSTECTOMY    . MELANOMA EXCISION     "off my nose"  . TUBAL LIGATION       Current Meds  Medication Sig  . albuterol (PROVENTIL HFA;VENTOLIN  HFA) 108 (90 Base) MCG/ACT inhaler Inhale 1-2 puffs into the lungs every 6 (six) hours as needed for wheezing or shortness of breath.  . ALPRAZolam (XANAX) 0.5 MG tablet Take 0.5 mg by mouth 3 (three) times daily as needed for anxiety.   Marland Kitchen apixaban (ELIQUIS) 5 MG TABS tablet Take 1 tablet (5 mg total) by mouth 2 (two) times daily for 30 days.  . DULoxetine (CYMBALTA) 60 MG capsule Take 60 mg by mouth daily.   . fluticasone (FLONASE) 50 MCG/ACT nasal spray Place 2 sprays into both nostrils daily.  . Fluticasone-Salmeterol (ADVAIR) 250-50 MCG/DOSE AEPB Inhale 1 puff into the lungs daily as needed.   Marland Kitchen HYDROcodone-acetaminophen (NORCO/VICODIN) 5-325 MG tablet Take 1 tablet by mouth every 6 (six) hours as needed for moderate pain.  Marland Kitchen insulin aspart (NOVOLOG) 100 UNIT/ML injection Inject 0-10 Units into the skin 3 (three) times daily with meals. For glucose 150 to 200 use 2 units, 201 to 250 use 4 units, 251-300 use 6 units, 301-350 use 8 units, 351 or greater 10 units.  . insulin glargine (LANTUS) 100 UNIT/ML injection Inject 0.2 mLs (20 Units total) into the skin at bedtime.  Marland Kitchen ketoconazole (NIZORAL) 2 % cream Apply 1 application topically daily as needed for irritation (APPLIED TO FEET).   Marland Kitchen loperamide (IMODIUM A-D) 2 MG tablet Take 2 mg by mouth 4 (four) times daily as needed for diarrhea or loose stools.  . metFORMIN (GLUCOPHAGE) 500 MG tablet Take 500 mg by mouth 2 (two) times daily with a meal.   . metoCLOPramide (REGLAN) 5 MG tablet TAKE 1 TABLET BY MOUTH 4 TIMES DAILY (BEFORE MEALS AND AT BEDTIME.)  . mometasone-formoterol (DULERA) 200-5 MCG/ACT AERO Inhale 2 puffs into the lungs 2 (two) times daily.  . pantoprazole (PROTONIX) 40 MG tablet TAKE 1 TABLET BY MOUTH ONCE DAILY.  . rosuvastatin (CRESTOR) 40 MG tablet Take 40 mg by mouth at bedtime.   . [DISCONTINUED] linaclotide (LINZESS) 145 MCG CAPS capsule Take 145 mcg by mouth daily as needed (for constipation).  . [DISCONTINUED] meclizine  (ANTIVERT) 12.5 MG tablet Take 12.5 mg by mouth 3 (three) times daily as needed for dizziness.  . [DISCONTINUED] traMADol (ULTRAM) 50 MG tablet Take 1 tablet (50 mg total) by mouth every 12 (twelve) hours as needed.     Allergies:   Adhesive [tape]   Social History   Tobacco Use  . Smoking status: Current Every Day Smoker    Packs/day: 1.00    Years: 32.00    Pack years: 32.00    Types: Cigarettes  . Smokeless tobacco: Never Used  Substance Use Topics  . Alcohol use: Not Currently    Comment: 09/08/2014 "I drank a little bit a long time ago"  . Drug use: No     Family Hx:  The patient's family history includes AAA (abdominal aortic aneurysm) in her mother; Diabetes in her sister; Heart disease in her mother; Lung cancer in her sister.  ROS:   Please see the history of present illness.     All other systems reviewed and are negative.   Prior CV studies:   The following studies were reviewed today:  07/2018 echo [facility] LVEF 65-70%, mild LVH  Labs/Other Tests and Data Reviewed:    EKG:  No ECG reviewed.  Recent Labs: 08/18/2018: TSH 0.113 08/22/2018: ALT 11; BUN 15; Creatinine, Ser 0.66; Hemoglobin 7.6; Magnesium 2.1; Platelets 196; Potassium 3.1; Sodium 137   Recent Lipid Panel No results found for: CHOL, TRIG, HDL, CHOLHDL, LDLCALC, LDLDIRECT  Wt Readings from Last 3 Encounters:  04/24/19 200 lb (90.7 kg)  08/20/18 208 lb 1.8 oz (94.4 kg)  09/27/16 199 lb (90.3 kg)     Objective:    Vital Signs:  Ht 5\' 4"  (1.626 m)   Wt 200 lb (90.7 kg)   BMI 34.33 kg/m    Normal affect. Normal speech pattern and tone. comforbtale no apparent distress. No audible signs of SOB or wheezing.   ASSESSMENT & PLAN:    1. Afib - some recent palpitations. Prior issues with low bp's during admission last year was termporarily on midodrine, now appears to be off, I don't have that history of a currnet bp - arrange nursing visit next week for EKG and vitals, depending on bp would start low  dose diltiazem - continue anticoag  2. Chronic diastolic HF - current SOB and wheezing more suggestive of her COPD as opposed to HF, has f/u with pcp next week - continue to monitor. Currently not on diuretic.    COVID-19 Education: The signs and symptoms of COVID-19 were discussed with the patient and how to seek care for testing (follow up with PCP or arrange E-visit).  The importance of social distancing was discussed today.  Time:   Today, I have spent 22 minutes with the patient with telehealth technology discussing the above problems.     Medication Adjustments/Labs and Tests Ordered: Current medicines are reviewed at length with the patient today.  Concerns regarding medicines are outlined above.   Tests Ordered: No orders of the defined types were placed in this encounter.   Medication Changes: No orders of the defined types were placed in this encounter.   Follow Up:  Either In Person or Virtual in 6 month(s)  Signed, Carlyle Dolly, MD  04/24/2019 10:31 AM    [place]

## 2019-05-01 DIAGNOSIS — F419 Anxiety disorder, unspecified: Secondary | ICD-10-CM | POA: Diagnosis not present

## 2019-05-01 DIAGNOSIS — G2581 Restless legs syndrome: Secondary | ICD-10-CM | POA: Diagnosis not present

## 2019-05-01 DIAGNOSIS — M545 Low back pain: Secondary | ICD-10-CM | POA: Diagnosis not present

## 2019-05-01 DIAGNOSIS — Z79899 Other long term (current) drug therapy: Secondary | ICD-10-CM | POA: Diagnosis not present

## 2019-05-02 ENCOUNTER — Ambulatory Visit: Payer: Medicare HMO

## 2019-05-03 ENCOUNTER — Other Ambulatory Visit (INDEPENDENT_AMBULATORY_CARE_PROVIDER_SITE_OTHER): Payer: Self-pay | Admitting: Gastroenterology

## 2019-05-03 DIAGNOSIS — K209 Esophagitis, unspecified without bleeding: Secondary | ICD-10-CM

## 2019-05-03 DIAGNOSIS — K219 Gastro-esophageal reflux disease without esophagitis: Secondary | ICD-10-CM

## 2019-05-14 ENCOUNTER — Ambulatory Visit (INDEPENDENT_AMBULATORY_CARE_PROVIDER_SITE_OTHER): Payer: Medicare HMO | Admitting: Internal Medicine

## 2019-05-24 ENCOUNTER — Other Ambulatory Visit (INDEPENDENT_AMBULATORY_CARE_PROVIDER_SITE_OTHER): Payer: Self-pay | Admitting: Internal Medicine

## 2019-05-24 DIAGNOSIS — K219 Gastro-esophageal reflux disease without esophagitis: Secondary | ICD-10-CM

## 2019-05-24 DIAGNOSIS — K209 Esophagitis, unspecified without bleeding: Secondary | ICD-10-CM

## 2019-05-30 DIAGNOSIS — F419 Anxiety disorder, unspecified: Secondary | ICD-10-CM | POA: Diagnosis not present

## 2019-05-30 DIAGNOSIS — I10 Essential (primary) hypertension: Secondary | ICD-10-CM | POA: Diagnosis not present

## 2019-05-30 DIAGNOSIS — E118 Type 2 diabetes mellitus with unspecified complications: Secondary | ICD-10-CM | POA: Diagnosis not present

## 2019-05-30 DIAGNOSIS — J449 Chronic obstructive pulmonary disease, unspecified: Secondary | ICD-10-CM | POA: Diagnosis not present

## 2019-05-30 DIAGNOSIS — G2581 Restless legs syndrome: Secondary | ICD-10-CM | POA: Diagnosis not present

## 2019-05-30 DIAGNOSIS — E785 Hyperlipidemia, unspecified: Secondary | ICD-10-CM | POA: Diagnosis not present

## 2019-05-30 DIAGNOSIS — F17210 Nicotine dependence, cigarettes, uncomplicated: Secondary | ICD-10-CM | POA: Diagnosis not present

## 2019-05-30 DIAGNOSIS — Z79891 Long term (current) use of opiate analgesic: Secondary | ICD-10-CM | POA: Diagnosis not present

## 2019-05-30 DIAGNOSIS — Z0001 Encounter for general adult medical examination with abnormal findings: Secondary | ICD-10-CM | POA: Diagnosis not present

## 2019-05-30 DIAGNOSIS — I509 Heart failure, unspecified: Secondary | ICD-10-CM | POA: Diagnosis not present

## 2019-06-04 ENCOUNTER — Other Ambulatory Visit (HOSPITAL_COMMUNITY): Payer: Self-pay | Admitting: Adult Health

## 2019-06-04 DIAGNOSIS — Z780 Asymptomatic menopausal state: Secondary | ICD-10-CM

## 2019-06-06 DIAGNOSIS — I639 Cerebral infarction, unspecified: Secondary | ICD-10-CM | POA: Diagnosis not present

## 2019-06-07 ENCOUNTER — Other Ambulatory Visit (INDEPENDENT_AMBULATORY_CARE_PROVIDER_SITE_OTHER): Payer: Self-pay | Admitting: Gastroenterology

## 2019-06-17 ENCOUNTER — Inpatient Hospital Stay (HOSPITAL_COMMUNITY): Admission: RE | Admit: 2019-06-17 | Payer: Medicare HMO | Source: Ambulatory Visit

## 2019-06-20 DIAGNOSIS — R531 Weakness: Secondary | ICD-10-CM | POA: Diagnosis not present

## 2019-06-20 DIAGNOSIS — R110 Nausea: Secondary | ICD-10-CM | POA: Diagnosis not present

## 2019-06-20 DIAGNOSIS — B309 Viral conjunctivitis, unspecified: Secondary | ICD-10-CM | POA: Diagnosis not present

## 2019-06-20 DIAGNOSIS — H5711 Ocular pain, right eye: Secondary | ICD-10-CM | POA: Diagnosis not present

## 2019-06-20 DIAGNOSIS — R197 Diarrhea, unspecified: Secondary | ICD-10-CM | POA: Diagnosis not present

## 2019-06-26 DIAGNOSIS — E785 Hyperlipidemia, unspecified: Secondary | ICD-10-CM | POA: Diagnosis not present

## 2019-06-26 DIAGNOSIS — E1165 Type 2 diabetes mellitus with hyperglycemia: Secondary | ICD-10-CM | POA: Diagnosis not present

## 2019-06-26 DIAGNOSIS — Z79899 Other long term (current) drug therapy: Secondary | ICD-10-CM | POA: Diagnosis not present

## 2019-06-26 DIAGNOSIS — E876 Hypokalemia: Secondary | ICD-10-CM | POA: Diagnosis not present

## 2019-06-26 DIAGNOSIS — E559 Vitamin D deficiency, unspecified: Secondary | ICD-10-CM | POA: Diagnosis not present

## 2019-06-26 DIAGNOSIS — I10 Essential (primary) hypertension: Secondary | ICD-10-CM | POA: Diagnosis not present

## 2019-06-27 DIAGNOSIS — F419 Anxiety disorder, unspecified: Secondary | ICD-10-CM | POA: Diagnosis not present

## 2019-06-27 DIAGNOSIS — Z794 Long term (current) use of insulin: Secondary | ICD-10-CM | POA: Diagnosis not present

## 2019-06-27 DIAGNOSIS — I509 Heart failure, unspecified: Secondary | ICD-10-CM | POA: Diagnosis not present

## 2019-06-27 DIAGNOSIS — I10 Essential (primary) hypertension: Secondary | ICD-10-CM | POA: Diagnosis not present

## 2019-06-27 DIAGNOSIS — E119 Type 2 diabetes mellitus without complications: Secondary | ICD-10-CM | POA: Diagnosis not present

## 2019-06-27 DIAGNOSIS — E785 Hyperlipidemia, unspecified: Secondary | ICD-10-CM | POA: Diagnosis not present

## 2019-06-27 DIAGNOSIS — E559 Vitamin D deficiency, unspecified: Secondary | ICD-10-CM | POA: Diagnosis not present

## 2019-06-27 DIAGNOSIS — G2581 Restless legs syndrome: Secondary | ICD-10-CM | POA: Diagnosis not present

## 2019-06-27 DIAGNOSIS — M545 Low back pain: Secondary | ICD-10-CM | POA: Diagnosis not present

## 2019-08-28 ENCOUNTER — Ambulatory Visit (INDEPENDENT_AMBULATORY_CARE_PROVIDER_SITE_OTHER): Payer: Medicare HMO | Admitting: Gastroenterology

## 2019-08-28 ENCOUNTER — Encounter (INDEPENDENT_AMBULATORY_CARE_PROVIDER_SITE_OTHER): Payer: Self-pay | Admitting: *Deleted

## 2019-08-28 ENCOUNTER — Other Ambulatory Visit (INDEPENDENT_AMBULATORY_CARE_PROVIDER_SITE_OTHER): Payer: Self-pay | Admitting: Internal Medicine

## 2019-08-28 ENCOUNTER — Encounter (INDEPENDENT_AMBULATORY_CARE_PROVIDER_SITE_OTHER): Payer: Self-pay | Admitting: Gastroenterology

## 2019-08-28 ENCOUNTER — Other Ambulatory Visit: Payer: Self-pay

## 2019-08-28 VITALS — BP 105/69 | HR 91 | Temp 97.7°F | Wt 174.0 lb

## 2019-08-28 DIAGNOSIS — K257 Chronic gastric ulcer without hemorrhage or perforation: Secondary | ICD-10-CM

## 2019-08-28 DIAGNOSIS — R197 Diarrhea, unspecified: Secondary | ICD-10-CM | POA: Diagnosis not present

## 2019-08-28 DIAGNOSIS — K219 Gastro-esophageal reflux disease without esophagitis: Secondary | ICD-10-CM

## 2019-08-28 DIAGNOSIS — R634 Abnormal weight loss: Secondary | ICD-10-CM

## 2019-08-28 DIAGNOSIS — R110 Nausea: Secondary | ICD-10-CM | POA: Diagnosis not present

## 2019-08-28 MED ORDER — ONDANSETRON HCL 4 MG PO TABS
4.0000 mg | ORAL_TABLET | Freq: Three times a day (TID) | ORAL | 1 refills | Status: DC | PRN
Start: 2019-08-28 — End: 2019-11-25

## 2019-08-28 NOTE — Progress Notes (Signed)
Patient profile: Jacqueline Hansen is a 79 y.o. female seen for evaluation of change in bowel habits.   History of Present Illness: Jacqueline Hansen is seen today for follow-up -was admitted 08/2018 and seen by Dr. Laural Golden for acute gastroenteritis secondary to a bacterial infection, also had coffee-ground emesis and CT showing esophageal wall thickening with a hemoglobin drop.  She is on aspirin and Plavix.  She had endoscopy as below showing a large prepyloric ulcer.  Noted to be taking Goody powders daily.  Seen today for f/up. Daughter accompanies helps w/ history.   Reports variable bowel habits - baseline was missing a few days without a BM then takes prune juice which causes abd pain and diarrhea. Few months ago (approx 3 months ago) transitioned to daily diarrhea, reports multiple stools per day,  Describes loose liquid stools usually with occasional formed stools. Few days ago had some black stools but this resolved and is taking pepto. Reports when has abd pain it is worse in LLQ. Using imodium and pepto daily for diarrhea.  Reports she is taking up to 3-5 Imodium daily for diarrhea.   Reports intermittent nausea with a poor appetite since the diarrhea began. No vomiting. Daughter is using pediatyle to try prevent dehydration. No dysphagia since last esophageal dilation.  Denies GERD symptoms on PPI dose.  Reports weight loss as below as unintentional.   Smokes 1 PPD, specifically denies NSAIDs, no alcohol.   Wt Readings from Last 3 Encounters:  08/28/19 174 lb (78.9 kg)  04/24/19 200 lb (90.7 kg)  08/20/18 208 lb 1.8 oz (94.4 kg)     Last Colonoscopy: per patient "long time ago" - at least 10-15 years.  Last Endoscopy: 08/2018- Endoscopy done for hematemesis-June 2020-1 nonbleeding gastric ulcer prepyloric region of stomach, 30 mm, few erosions gastric antrum, food in mid esophagus.  Repeat EGD 3 months recommended to check for healing  Past Medical History:  Past Medical History:    Diagnosis Date  . Anxiety   . Arthritis    "joints" (09/08/2014)  . Asthma   . CHF (congestive heart failure) ([place])   . Chronic bronchitis ([place])    "get it q yr"  . COPD (chronic obstructive pulmonary disease) ([place])   . Depression   . Fibromyalgia   . Gastric ulcer   . GERD (gastroesophageal reflux disease)   . 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 History of blood transfusion ?2015   "cause I was throwing it up"  . History of hiatal hernia   . Hypercholesterolemia   . 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 On home oxygen therapy    "2L ususally at night time" (09/08/2014)  . Pulmonary embolism ([place])    "when I had my gallbladder taken out"  . Type II diabetes mellitus ([place])     Problem List: Patient Active Problem List   Diagnosis Date Noted  . Acute esophagitis 08/19/2018  . Heme positive stool 08/19/2018  . Melena 08/19/2018  . Leukocytosis 08/19/2018  . Sepsis due to undetermined organism ([place]) 08/18/2018  . Hypokalemia 08/18/2018  . Hypomagnesemia 08/18/2018  . AKI (acute kidney injury) ([place]) 08/18/2018  . Anemia 08/18/2018  . Prolonged QT interval 08/18/2018  . Hypercholesterolemia   . GERD (gastroesophageal reflux disease)   . Cellulitis in diabetic foot ([place]) 08/23/2016  . COPD with chronic bronchitis ([place]) 08/23/2016  . Tobacco abuse 08/23/2016  . SOB (shortness of breath) 08/12/2016  .  COPD with acute exacerbation ([place]) 08/12/2016  . Cellulitis and abscess of foot   . Cellulitis 08/06/2016  . Benign neoplasm of sigmoid colon   . Rectal ulceration   . GI bleed 09/07/2014  . Colitis 09/07/2014  . COPD (chronic obstructive pulmonary disease) ([place]) 09/07/2014  . Type II diabetes mellitus ([place]) 09/07/2014  . PAD (peripheral artery disease) ([place]) 09/07/2014  . Urinary retention 09/07/2014    Past Surgical History: Past Surgical History:  Procedure Laterality Date  . ABDOMINAL HYSTERECTOMY    . BIOPSY  08/20/2018   Procedure: BIOPSY;   Surgeon: Rogene Houston, MD;  Location[facility];  Service: Endoscopy;;  . CARPAL TUNNEL RELEASE Bilateral   . CATARACT EXTRACTION W/ INTRAOCULAR LENS  IMPLANT, BILATERAL Bilateral   . COLONOSCOPY N/A 09/09/2014   Procedure: COLONOSCOPY;  Surgeon: Irene Shipper, MD;  Location: [facility];  Service: Endoscopy;  Laterality: N/A;  . ESOPHAGOGASTRODUODENOSCOPY N/A 08/20/2018   Procedure: ESOPHAGOGASTRODUODENOSCOPY (EGD);  Surgeon: Rogene Houston, MD;  Location: [facility];  Service: Endoscopy;  Laterality: N/A;  . EXCISIONAL HEMORRHOIDECTOMY    . INCISION AND DRAINAGE ABSCESS Right 08/09/2016   Procedure: INCISION AND DRAINAGE ABSCESS;  Surgeon: Aviva Signs, MD;  Location: [facility] ORS;  Service: General;  Laterality: Right;  . LAPAROSCOPIC CHOLECYSTECTOMY    . MELANOMA EXCISION     "off my nose"  . TUBAL LIGATION      Allergies: Allergies  Allergen Reactions  . Adhesive [Tape] Other (See Comments)    Reaction:  Tears pts skin       Home Medications:  Current Outpatient Medications:  .  albuterol (PROVENTIL HFA;VENTOLIN HFA) 108 (90 Base) MCG/ACT inhaler, Inhale 1-2 puffs into the lungs every 6 (six) hours as needed for wheezing or shortness of breath., Disp: , Rfl:  .  ALPRAZolam (XANAX) 0.5 MG tablet, Take 0.5 mg by mouth 3 (three) times daily as needed for anxiety. , Disp: , Rfl:  .  amLODipine (NORVASC) 5 MG tablet, Take 5 mg by mouth daily. , Disp: , Rfl:  .  apixaban (ELIQUIS) 5 MG TABS tablet, Take 1 tablet (5 mg total) by mouth 2 (two) times daily for 30 days., Disp: 60 tablet, Rfl: 3 .  DULoxetine (CYMBALTA) 60 MG capsule, Take 60 mg by mouth daily. , Disp: , Rfl:  .  fluticasone (FLONASE) 50 MCG/ACT nasal spray, Place 2 sprays into both nostrils daily., Disp: , Rfl:  .  Fluticasone-Salmeterol (ADVAIR) 250-50 MCG/DOSE AEPB, Inhale 1 puff into the lungs daily as needed. , Disp: , Rfl:  .  furosemide (LASIX) 40 MG tablet, Take 40 mg by mouth daily. , Disp: , Rfl:  .   HYDROcodone-acetaminophen (NORCO/VICODIN) 5-325 MG tablet, Take 1 tablet by mouth every 6 (six) hours as needed for moderate pain., Disp: 10 tablet, Rfl: 0 .  insulin aspart (NOVOLOG) 100 UNIT/ML injection, Inject 0-10 Units into the skin 3 (three) times daily with meals. For glucose 150 to 200 use 2 units, 201 to 250 use 4 units, 251-300 use 6 units, 301-350 use 8 units, 351 or greater 10 units., Disp: 10 mL, Rfl: 11 .  insulin glargine (LANTUS) 100 UNIT/ML injection, Inject 0.2 mLs (20 Units total) into the skin at bedtime., Disp: 10 mL, Rfl: 11 .  ketoconazole (NIZORAL) 2 % cream, Apply 1 application topically daily as needed for irritation (APPLIED TO FEET). , Disp: , Rfl:  .  loperamide (IMODIUM A-D) 2 MG tablet, Take 2 mg by mouth 4 (  four) times daily as needed for diarrhea or loose stools., Disp: , Rfl:  .  mometasone-formoterol (DULERA) 200-5 MCG/ACT AERO, Inhale 2 puffs into the lungs 2 (two) times daily., Disp: , Rfl:  .  pantoprazole (PROTONIX) 40 MG tablet, TAKE 1 TABLET BY MOUTH ONCE DAILY., Disp: 28 tablet, Rfl: 3 .  potassium chloride (MICRO-K) 10 MEQ CR capsule, Take 10 mEq by mouth daily. , Disp: , Rfl:  .  rOPINIRole (REQUIP) 1 MG tablet, Take 1 mg by mouth at bedtime. , Disp: , Rfl:  .  metFORMIN (GLUCOPHAGE) 500 MG tablet, Take 500 mg by mouth 2 (two) times daily with a meal.  (Patient not taking: Reported on 08/28/2019), Disp: , Rfl:  .  ondansetron (ZOFRAN) 4 MG tablet, Take 1 tablet (4 mg total) by mouth every 8 (eight) hours as needed for nausea or vomiting., Disp: 30 tablet, Rfl: 1 .  rosuvastatin (CRESTOR) 40 MG tablet, Take 40 mg by mouth at bedtime.  (Patient not taking: Reported on 08/28/2019), Disp: , Rfl:    Family History: family history includes AAA (abdominal aortic aneurysm) in her mother; Diabetes in her sister; Heart disease in her mother; Lung cancer in her sister.   Father - colon polyps older age   Sister - passed at 46 from colon cancer   Social History:    reports that she has been smoking cigarettes. She has a 32.00 pack-year smoking history. She has never used smokeless tobacco. She reports previous alcohol use. She reports that she does not use drugs.   Review of Systems: Constitutional: + weight loss   Eyes: No changes in vision. ENT: No oral lesions, sore throat.  GI: see HPI.  Heme/Lymph: No easy bruising.  CV: No chest pain.  GU: No hematuria.  Integumentary: No rashes.  Neuro: No headaches.  Psych: No depression/anxiety.  Endocrine: No heat/cold intolerance.  Allergic/Immunologic: No urticaria.  Resp: No cough, SOB.  Musculoskeletal: No joint swelling.    Physical Examination: BP 105/69 (BP Location: Right Arm, Patient Position: Sitting, Cuff Size: Normal)   Pulse 91   Temp 97.7 F (36.5 C) (Oral)   Wt 174 lb (78.9 kg) Comment: Peri patient she weighed at PCP last week.Can't stand per pt  BMI 29.87 kg/m  Gen: NAD, alert and oriented x 4 HEENT: PEERLA, EOMI, Neck: supple, no JVD Chest: CTA bilaterally, no wheezes, crackles, or other adventitious sounds CV: RRR, no m/g/c/r Abd: soft, NT, ND, +BS in all four quadrants; no HSM, guarding, ridigity, or rebound tenderness Ext: no edema, well perfused with 2+ pulses, Skin: no rash or lesions noted on observed skin Lymph: no noted LAD  Data Reviewed:   No recent labs available in epic  Assessment/Plan: Ms. Heiner is a 79 y.o. female seen today for diarrhea  1.  Diarrhea-ongoing x3 months with Imodium use daily.  Also associated with weight loss.  She reports a history of colon cancer in her sister.  Unable to find last colonoscopy report which was reportedly years ago.  She is due for colonoscopy at this time.  She denies any new medication diarrhea BM.  Check basic labs and stool studies to exclude infection.  2.  Gastric ulcer-visualize last year on endoscopy.  She specifically denies NSAID use as her daughter is no longer buying her these.  We will repeat endoscopy at  time of colonoscopy to verify healing.  3. Nausea - requests refill of zofran until EGD. Continue PPI. Pt unsure if she is taking the reglan  on her med list.    Patient denies CP, SOB, and use of blood thinners. I discussed the risks and benefits of procedure including bleeding, perforation, infection, missed lesions, medication reactions and possible hospitalization or surgery if complications. All questions answered. She is on ELIQUIS.     Jisel was seen today for new patient (initial visit).  Diagnoses and all orders for this visit:  Loss of weight -     CBC with Differential -     COMPLETE METABOLIC PANEL WITH GFR -     TSH + free T4 -     Gastrointestinal Pathogen Panel PCR -     C. difficile GDH and Toxin A/B  Diarrhea, unspecified type -     CBC with Differential -     COMPLETE METABOLIC PANEL WITH GFR -     TSH + free T4 -     Gastrointestinal Pathogen Panel PCR -     C. difficile GDH and Toxin A/B  Nausea without vomiting -     CBC with Differential -     COMPLETE METABOLIC PANEL WITH GFR -     TSH + free T4 -     Gastrointestinal Pathogen Panel PCR -     C. difficile GDH and Toxin A/B  Gastroesophageal reflux disease, unspecified whether esophagitis present  Chronic gastric ulcer, unspecified whether gastric ulcer hemorrhage or perforation present  Other orders -     ondansetron (ZOFRAN) 4 MG tablet; Take 1 tablet (4 mg total) by mouth every 8 (eight) hours as needed for nausea or vomiting.        I personally performed the service, non-incident to. (WP)  Laurine Blazer, [facility]

## 2019-08-28 NOTE — Patient Instructions (Signed)
Try nausea medication, continue protonix and avoiding all nsaid products

## 2019-08-29 ENCOUNTER — Telehealth (INDEPENDENT_AMBULATORY_CARE_PROVIDER_SITE_OTHER): Payer: Self-pay | Admitting: *Deleted

## 2019-08-29 NOTE — Telephone Encounter (Signed)
Per Garner Gavel, MP it is ok for patient to stop Eliquis 2 days before TCS/EGD sch'd 10/02/19 - patient aware

## 2019-09-05 ENCOUNTER — Other Ambulatory Visit (INDEPENDENT_AMBULATORY_CARE_PROVIDER_SITE_OTHER): Payer: Self-pay | Admitting: *Deleted

## 2019-09-12 ENCOUNTER — Other Ambulatory Visit (INDEPENDENT_AMBULATORY_CARE_PROVIDER_SITE_OTHER): Payer: Self-pay | Admitting: Internal Medicine

## 2019-09-12 DIAGNOSIS — K219 Gastro-esophageal reflux disease without esophagitis: Secondary | ICD-10-CM

## 2019-09-12 DIAGNOSIS — K209 Esophagitis, unspecified without bleeding: Secondary | ICD-10-CM

## 2019-09-26 NOTE — Patient Instructions (Signed)
Rennie Hack  09/26/2019     @PREFPERIOPPHARMACY @   Your procedure is scheduled on  10/02/2019.  Report to [facility] at  0700  A.M.  Call this number if you have problems the morning of surgery:  [phone]   Remember:  Follow the diet and prep instructions given to you by Dr Olevia Perches office.                    Take these medicines the morning of surgery with A SIP OF WATER  Xanax(if needed), amlodipine, cymbalta, hydrocodone(if needed), zofran(if needed), protonix. Use your inhalers and your nebulizer before you come. If you have a rescue inhaler bring it with you. Take 1/2 of your night time insulin the night before your procedure. DO NOT take any medications for diabetes the morning of your procedure.    Do not wear jewelry, make-up or nail polish.  Do not wear lotions, powders, or perfumes. Please wear deodorant and brush your teeth.  Do not shave 48 hours prior to surgery.  Men may shave face and neck.  Do not bring valuables to the hospital.  [facility] is not responsible for any belongings or valuables.  Contacts, dentures or bridgework may not be worn into surgery.  Leave your suitcase in the car.  After surgery it may be brought to your room.  For patients admitted to the hospital, discharge time will be determined by your treatment team.  Patients discharged the day of surgery will not be allowed to drive home.   Name and phone number of your driver:   family Special instructions:  DO NOT smoke the morning of your procedure.  Please read over the following fact sheets that you were given. Anesthesia Post-op Instructions and Care and Recovery After Surgery       Upper Endoscopy, Adult, Care After This sheet gives you information about how to care for yourself after your procedure. Your health care provider may also give you more specific instructions. If you have problems or questions, contact your health care provider. What can I expect after the  procedure? After the procedure, it is common to have:  A sore throat.  Mild stomach pain or discomfort.  Bloating.  Nausea. Follow these instructions at home:   Follow instructions from your health care provider about what to eat or drink after your procedure.  Return to your normal activities as told by your health care provider. Ask your health care provider what activities are safe for you.  Take over-the-counter and prescription medicines only as told by your health care provider.  Do not drive for 24 hours if you were given a sedative during your procedure.  Keep all follow-up visits as told by your health care provider. This is important. Contact a health care provider if you have:  A sore throat that lasts longer than one day.  Trouble swallowing. Get help right away if:  You vomit blood or your vomit looks like coffee grounds.  You have: ? A fever. ? Bloody, black, or tarry stools. ? A severe sore throat or you cannot swallow. ? Difficulty breathing. ? Severe pain in your chest or abdomen. Summary  After the procedure, it is common to have a sore throat, mild stomach discomfort, bloating, and nausea.  Do not drive for 24 hours if you were given a sedative during the procedure.  Follow instructions from your health care provider about what to eat or  drink after your procedure.  Return to your normal activities as told by your health care provider. This information is not intended to replace advice given to you by your health care provider. Make sure you discuss any questions you have with your health care provider. Document Revised: 08/15/2017 Document Reviewed: 07/24/2017 Elsevier Patient Education  [place].  Colonoscopy, Adult, Care After This sheet gives you information about how to care for yourself after your procedure. Your health care provider may also give you more specific instructions. If you have problems or questions, contact your  health care provider. What can I expect after the procedure? After the procedure, it is common to have:  A small amount of blood in your stool for 24 hours after the procedure.  Some gas.  Mild cramping or bloating of your abdomen. Follow these instructions at home: Eating and drinking   Drink enough fluid to keep your urine pale yellow.  Follow instructions from your health care provider about eating or drinking restrictions.  Resume your normal diet as instructed by your health care provider. Avoid heavy or fried foods that are hard to digest. Activity  Rest as told by your health care provider.  Avoid sitting for a long time without moving. Get up to take short walks every 1-2 hours. This is important to improve blood flow and breathing. Ask for help if you feel weak or unsteady.  Return to your normal activities as told by your health care provider. Ask your health care provider what activities are safe for you. Managing cramping and bloating   Try walking around when you have cramps or feel bloated.  Apply heat to your abdomen as told by your health care provider. Use the heat source that your health care provider recommends, such as a moist heat pack or a heating pad. ? Place a towel between your skin and the heat source. ? Leave the heat on for 20-30 minutes. ? Remove the heat if your skin turns bright red. This is especially important if you are unable to feel pain, heat, or cold. You may have a greater risk of getting burned. General instructions  For the first 24 hours after the procedure: ? Do not drive or use machinery. ? Do not sign important documents. ? Do not drink alcohol. ? Do your regular daily activities at a slower pace than normal. ? Eat soft foods that are easy to digest.  Take over-the-counter and prescription medicines only as told by your health care provider.  Keep all follow-up visits as told by your health care provider. This is  important. Contact a health care provider if:  You have blood in your stool 2-3 days after the procedure. Get help right away if you have:  More than a small spotting of blood in your stool.  Large blood clots in your stool.  Swelling of your abdomen.  Nausea or vomiting.  A fever.  Increasing pain in your abdomen that is not relieved with medicine. Summary  After the procedure, it is common to have a small amount of blood in your stool. You may also have mild cramping and bloating of your abdomen.  For the first 24 hours after the procedure, do not drive or use machinery, sign important documents, or drink alcohol.  Get help right away if you have a lot of blood in your stool, nausea or vomiting, a fever, or increased pain in your abdomen. This information is not intended to replace advice given to  you by your health care provider. Make sure you discuss any questions you have with your health care provider. Document Revised: 09/17/2018 Document Reviewed: 09/17/2018 Elsevier Patient Education  [place] After These instructions provide you with information about caring for yourself after your procedure. Your health care provider may also give you more specific instructions. Your treatment has been planned according to current medical practices, but problems sometimes occur. Call your health care provider if you have any problems or questions after your procedure. What can I expect after the procedure? After your procedure, you may:  Feel sleepy for several hours.  Feel clumsy and have poor balance for several hours.  Feel forgetful about what happened after the procedure.  Have poor judgment for several hours.  Feel nauseous or vomit.  Have a sore throat if you had a breathing tube during the procedure. Follow these instructions at home: For at least 24 hours after the procedure:      Have a responsible adult stay with you. It  is important to have someone help care for you until you are awake and alert.  Rest as needed.  Do not: ? Participate in activities in which you could fall or become injured. ? Drive. ? Use heavy machinery. ? Drink alcohol. ? Take sleeping pills or medicines that cause drowsiness. ? Make important decisions or sign legal documents. ? Take care of children on your own. Eating and drinking  Follow the diet that is recommended by your health care provider.  If you vomit, drink water, juice, or soup when you can drink without vomiting.  Make sure you have little or no nausea before eating solid foods. General instructions  Take over-the-counter and prescription medicines only as told by your health care provider.  If you have sleep apnea, surgery and certain medicines can increase your risk for breathing problems. Follow instructions from your health care provider about wearing your sleep device: ? Anytime you are sleeping, including during daytime naps. ? While taking prescription pain medicines, sleeping medicines, or medicines that make you drowsy.  If you smoke, do not smoke without supervision.  Keep all follow-up visits as told by your health care provider. This is important. Contact a health care provider if:  You keep feeling nauseous or you keep vomiting.  You feel light-headed.  You develop a rash.  You have a fever. Get help right away if:  You have trouble breathing. Summary  For several hours after your procedure, you may feel sleepy and have poor judgment.  Have a responsible adult stay with you for at least 24 hours or until you are awake and alert. This information is not intended to replace advice given to you by your health care provider. Make sure you discuss any questions you have with your health care provider. Document Revised: 05/22/2017 Document Reviewed: 06/14/2015 Elsevier Patient Education  [place].

## 2019-09-30 ENCOUNTER — Telehealth: Payer: Self-pay | Admitting: Cardiology

## 2019-09-30 ENCOUNTER — Other Ambulatory Visit: Payer: Self-pay

## 2019-09-30 ENCOUNTER — Encounter (HOSPITAL_COMMUNITY)
Admission: RE | Admit: 2019-09-30 | Discharge: 2019-09-30 | Disposition: A | Discharge status: Home / Self Care | Payer: Medicare HMO | Source: Ambulatory Visit | Attending: Internal Medicine | Admitting: Internal Medicine

## 2019-09-30 ENCOUNTER — Other Ambulatory Visit (HOSPITAL_COMMUNITY)
Admission: RE | Admit: 2019-09-30 | Discharge: 2019-09-30 | Disposition: A | Discharge status: Home / Self Care | Payer: Medicare HMO | Source: Ambulatory Visit | Attending: Internal Medicine | Admitting: Internal Medicine

## 2019-09-30 ENCOUNTER — Encounter (HOSPITAL_COMMUNITY): Payer: Self-pay

## 2019-09-30 DIAGNOSIS — Z20822 Contact with and (suspected) exposure to covid-19: Secondary | ICD-10-CM | POA: Insufficient documentation

## 2019-09-30 DIAGNOSIS — Z01818 Encounter for other preprocedural examination: Secondary | ICD-10-CM | POA: Insufficient documentation

## 2019-09-30 LAB — COMPREHENSIVE METABOLIC PANEL
ALT: 9 U/L (ref 0–44)
AST: 16 U/L (ref 15–41)
Albumin: 2.9 g/dL — ABNORMAL LOW (ref 3.5–5.0)
Alkaline Phosphatase: 73 U/L (ref 38–126)
Anion gap: 13 (ref 5–15)
BUN: 10 mg/dL (ref 8–23)
CO2: 23 mmol/L (ref 22–32)
Calcium: 8.8 mg/dL — ABNORMAL LOW (ref 8.9–10.3)
Chloride: 99 mmol/L (ref 98–111)
Creatinine, Ser: 0.62 mg/dL (ref 0.44–1.00)
GFR calc Af Amer: >60 mL/min (ref >60)
GFR calc non Af Amer: >60 mL/min (ref >60)
Glucose, Bld: 96 mg/dL (ref 70–99)
Potassium: 4.2 mmol/L (ref 3.5–5.1)
Sodium: 135 mmol/L (ref 135–145)
Total Bilirubin: 0.4 mg/dL (ref 0.3–1.2)
Total Protein: 6.7 g/dL (ref 6.5–8.1)

## 2019-09-30 LAB — CBC WITH DIFFERENTIAL/PLATELET
Abs Immature Granulocytes: 0.06 10*3/uL (ref 0.00–0.07)
Basophils Absolute: 0.1 10*3/uL (ref 0.0–0.1)
Basophils Relative: 0 %
Eosinophils Absolute: 0.3 10*3/uL (ref 0.0–0.5)
Eosinophils Relative: 2 %
HCT: 40.2 % (ref 36.0–46.0)
Hemoglobin: 12 g/dL (ref 12.0–15.0)
Immature Granulocytes: 0 %
Lymphocytes Relative: 17 %
Lymphs Abs: 2.8 10*3/uL (ref 0.7–4.0)
MCH: 23.7 pg — ABNORMAL LOW (ref 26.0–34.0)
MCHC: 29.9 g/dL — ABNORMAL LOW (ref 30.0–36.0)
MCV: 79.3 fL — ABNORMAL LOW (ref 80.0–100.0)
Monocytes Absolute: 1 10*3/uL (ref 0.1–1.0)
Monocytes Relative: 6 %
Neutro Abs: 12.6 10*3/uL — ABNORMAL HIGH (ref 1.7–7.7)
Neutrophils Relative %: 75 %
Platelets: 462 10*3/uL — ABNORMAL HIGH (ref 150–400)
RBC: 5.07 MIL/uL (ref 3.87–5.11)
RDW: 19.7 % — ABNORMAL HIGH (ref 11.5–15.5)
WBC: 16.8 10*3/uL — ABNORMAL HIGH (ref 4.0–10.5)
nRBC: 0 % (ref 0.0–0.2)

## 2019-09-30 LAB — TSH: TSH: 0.274 u[IU]/mL — ABNORMAL LOW (ref 0.350–4.500)

## 2019-09-30 NOTE — Telephone Encounter (Signed)
Per [facility], Pt was to have medication changes before procedure.   Please call Pam-DaySurg [phone]   Thanks renee

## 2019-09-30 NOTE — Progress Notes (Signed)
EKG reviewed by Dr Purdy Haste.  Cardiology notified to review EKG.

## 2019-09-30 NOTE — Telephone Encounter (Signed)
Attempt to call back Pam in day surgery, line just rings.

## 2019-10-01 ENCOUNTER — Encounter (HOSPITAL_COMMUNITY): Payer: Self-pay | Admitting: Anesthesiology

## 2019-10-01 LAB — T4, FREE: Free T4: 0.99 ng/dL (ref 0.61–1.12)

## 2019-10-01 LAB — SARS CORONAVIRUS 2 (TAT 6-24 HRS): SARS Coronavirus 2: NEGATIVE

## 2019-10-01 NOTE — Telephone Encounter (Signed)
Called number back, they will give Pam the message to call us.

## 2019-10-02 ENCOUNTER — Ambulatory Visit (HOSPITAL_COMMUNITY): Admission: RE | Admit: 2019-10-02 | Payer: Medicare HMO | Source: Home / Self Care | Admitting: Internal Medicine

## 2019-10-02 ENCOUNTER — Encounter (HOSPITAL_COMMUNITY): Admission: RE | Payer: Self-pay | Source: Home / Self Care

## 2019-10-02 SURGERY — COLONOSCOPY WITH PROPOFOL
Anesthesia: Monitor Anesthesia Care

## 2019-10-02 NOTE — Progress Notes (Signed)
No show this am, nurse called patient and the patient stated,"had problem with the prep and she wanted to reschedule".  Instructed to call office to reschedule procedure.

## 2019-10-02 NOTE — Telephone Encounter (Signed)
Pam send staff message which I forwarded to Dr.Branch.

## 2019-10-22 ENCOUNTER — Other Ambulatory Visit (INDEPENDENT_AMBULATORY_CARE_PROVIDER_SITE_OTHER): Payer: Self-pay | Admitting: Gastroenterology

## 2019-10-22 DIAGNOSIS — K209 Esophagitis, unspecified without bleeding: Secondary | ICD-10-CM

## 2019-10-22 DIAGNOSIS — K219 Gastro-esophageal reflux disease without esophagitis: Secondary | ICD-10-CM

## 2019-10-28 ENCOUNTER — Ambulatory Visit: Payer: Medicare HMO | Admitting: Cardiology

## 2019-10-28 NOTE — Progress Notes (Deleted)
Clinical Summary Jacqueline Hansen is a 79 y.o.female  seen today for follow up of the following medical problems.    1. Afib - isolated episode during 08/2018 admssion when admitted with  GI bleeding and esophagitis - was able ot be started on eliquis, has not required av nodal agent.   - no recent bleeding  - can have palpitations at times.     2. Chronic diastolic HF - chronic RLE edema uncanged  3. History of PE  4. History of GI bleeding - admission 08/2018 with esophagitis, upper GI bleed - 08/2018 EGD ersovie gastricitis, +ulcer - was on DAPT for unclear indicaoitn at the time, has history of PAD and CVA   5. Low bp - had been on midodrine at 08/2018 discharge - no longer taking, she is not sure of the histoyr.   6. COPD - +cough, +wheezing - has pcp appt next week  7/. History of prior CVA Past Medical History:  Diagnosis Date  . Anxiety   . Arthritis    "joints" (09/08/2014)  . Asthma   . CHF (congestive heart failure) ([place])   . Chronic bronchitis ([place])    "get it q yr"  . COPD (chronic obstructive pulmonary disease) ([place])   . Depression   . Fibromyalgia   . Gastric ulcer   . GERD (gastroesophageal reflux disease)   . 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 History of blood transfusion ?2015   "cause I was throwing it up"  . History of hiatal hernia   . Hypercholesterolemia   . 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 On home oxygen therapy    "2L ususally at night time" (09/08/2014)  . Pulmonary embolism ([place])    "when I had my gallbladder taken out"  . Type II diabetes mellitus (HCC)      Allergies  Allergen Reactions  . Adhesive [Tape] Other (See Comments)    Reaction:  Tears pts skin      Current Outpatient Medications  Medication Sig Dispense Refill  . albuterol (PROVENTIL HFA;VENTOLIN HFA) 108 (90 Base) MCG/ACT inhaler Inhale 1-2 puffs into the lungs every 6 (six) hours as needed for wheezing or shortness of  breath.    Marland Kitchen albuterol (PROVENTIL) (2.5 MG/3ML) 0.083% nebulizer solution Take 2.5 mg by nebulization every 6 (six) hours as needed for wheezing or shortness of breath.    . ALPRAZolam (XANAX) 0.5 MG tablet Take 0.5 mg by mouth 2 (two) times daily.     Marland Kitchen amLODipine (NORVASC) 5 MG tablet Take 5 mg by mouth daily.     Marland Kitchen apixaban (ELIQUIS) 5 MG TABS tablet Take 5 mg by mouth 2 (two) times daily.    Marland Kitchen bismuth subsalicylate (PEPTO BISMOL) 262 MG/15ML suspension Take 30 mLs by mouth every 6 (six) hours as needed for indigestion or diarrhea or loose stools.    . DULoxetine (CYMBALTA) 30 MG capsule Take 60 mg by mouth daily.     . fluticasone (FLONASE) 50 MCG/ACT nasal spray Place 2 sprays into both nostrils daily as needed for allergies.     . Fluticasone-Salmeterol (ADVAIR) 500-50 MCG/DOSE AEPB Inhale 1 puff into the lungs daily.     . furosemide (LASIX) 40 MG tablet Take 40 mg by mouth 2 (two) times daily.     Marland Kitchen HYDROcodone-acetaminophen (NORCO/VICODIN) 5-325 MG tablet Take 1 tablet by mouth every 6 (six) hours as needed for moderate pain. (Patient taking differently:  Take 1 tablet by mouth in the morning, at noon, and at bedtime. ) 10 tablet 0  . insulin aspart (NOVOLOG) 100 UNIT/ML injection Inject 0-10 Units into the skin 3 (three) times daily with meals. For glucose 150 to 200 use 2 units, 201 to 250 use 4 units, 251-300 use 6 units, 301-350 use 8 units, 351 or greater 10 units. 10 mL 11  . insulin glargine (LANTUS) 100 UNIT/ML injection Inject 0.2 mLs (20 Units total) into the skin at bedtime. (Patient taking differently: Inject 30 Units into the skin at bedtime. ) 10 mL 11  . ketoconazole (NIZORAL) 2 % cream Apply 1 application topically daily as needed for irritation (APPLIED TO FEET).     Marland Kitchen loperamide (IMODIUM A-D) 2 MG tablet Take 2 mg by mouth 4 (four) times daily as needed for diarrhea or loose stools.    . metoCLOPramide (REGLAN) 5 MG tablet TAKE 1 TABLET 4 TIMES A DAY, BEFORE MEALS AND AT  BEDTIME. (Patient not taking: Reported on 09/16/2019) 120 tablet 0  . ondansetron (ZOFRAN) 4 MG tablet Take 1 tablet (4 mg total) by mouth every 8 (eight) hours as needed for nausea or vomiting. 30 tablet 1  . pantoprazole (PROTONIX) 40 MG tablet Take 1 tablet (40 mg total) by mouth daily. 28 tablet 3  . potassium chloride (MICRO-K) 10 MEQ CR capsule Take 10 mEq by mouth daily.     Marland Kitchen rOPINIRole (REQUIP) 1 MG tablet Take 1 mg by mouth at bedtime.     . Vitamin D, Ergocalciferol, (DRISDOL) 1.25 MG (50000 UNIT) CAPS capsule Take 50,000 Units by mouth every 7 (seven) days. Saturdays     No current facility-administered medications for this visit.     Past Surgical History:  Procedure Laterality Date  . ABDOMINAL HYSTERECTOMY    . BIOPSY  08/20/2018   Procedure: BIOPSY;  Surgeon: Rogene Houston, MD;  [facility];  Service: Endoscopy;;  . CARPAL TUNNEL RELEASE Bilateral   . CATARACT EXTRACTION W/ INTRAOCULAR LENS  IMPLANT, BILATERAL Bilateral   . COLONOSCOPY N/A 09/09/2014   Procedure: COLONOSCOPY;  Surgeon: Irene Shipper, MD;  Location: [place];  Service: Endoscopy;  Laterality: N/A;  . ESOPHAGOGASTRODUODENOSCOPY N/A 08/20/2018   Procedure: ESOPHAGOGASTRODUODENOSCOPY (EGD);  Surgeon: Rogene Houston, MD;  [facility];  Service: Endoscopy;  Laterality: N/A;  . EXCISIONAL HEMORRHOIDECTOMY    . INCISION AND DRAINAGE ABSCESS Right 08/09/2016   Procedure: INCISION AND DRAINAGE ABSCESS;  Surgeon: Aviva Signs, MD;  Location: [facility];  Service: General;  Laterality: Right;  . LAPAROSCOPIC CHOLECYSTECTOMY    . MELANOMA EXCISION     "off my nose"  . TUBAL LIGATION       Allergies  Allergen Reactions  . Adhesive [Tape] Other (See Comments)    Reaction:  Tears pts skin       Family History  Problem Relation Age of Onset  . Heart disease Mother   . AAA (abdominal aortic aneurysm) Mother        Died of aneurysm  . Diabetes Sister   . Lung cancer Sister       Social History Jacqueline Hansen reports that she has been smoking cigarettes. She has a 32.00 pack-year smoking history. She has never used smokeless tobacco. Ms. Denes reports previous alcohol use.   Review of Systems CONSTITUTIONAL: No weight loss, fever, chills, weakness or fatigue.  HEENT: Eyes: No visual loss, blurred vision, double vision or yellow sclerae.No hearing loss, sneezing, congestion,  runny nose or sore throat.  SKIN: No rash or itching.  CARDIOVASCULAR:  RESPIRATORY: No shortness of breath, cough or sputum.  GASTROINTESTINAL: No anorexia, nausea, vomiting or diarrhea. No abdominal pain or blood.  GENITOURINARY: No burning on urination, no polyuria NEUROLOGICAL: No headache, dizziness, syncope, paralysis, ataxia, numbness or tingling in the extremities. No change in bowel or bladder control.  MUSCULOSKELETAL: No muscle, back pain, joint pain or stiffness.  LYMPHATICS: No enlarged nodes. No history of splenectomy.  PSYCHIATRIC: No history of depression or anxiety.  ENDOCRINOLOGIC: No reports of sweating, cold or heat intolerance. No polyuria or polydipsia.  Marland Kitchen   Physical Examination There were no vitals filed for this visit. There were no vitals filed for this visit.  Gen: resting comfortably, no acute distress HEENT: no scleral icterus, pupils equal round and reactive, no palptable cervical adenopathy,  CV Resp: Clear to auscultation bilaterally GI: abdomen is soft, non-tender, non-distended, normal bowel sounds, no hepatosplenomegaly MSK: extremities are warm, no edema.  Skin: warm, no rash Neuro:  no focal deficits Psych: appropriate affect   Diagnostic Studies     Assessment and Plan  1. Afib - some recent palpitations. Prior issues with low bp's during admission last year was termporarily on midodrine, now appears to be off, I don't have that history of a currnet bp - arrange nursing visit next week for EKG and vitals, depending on bp would start low  dose diltiazem - continue anticoag  2. Chronic diastolic HF - current SOB and wheezing more suggestive of her COPD as opposed to HF, has f/u with pcp next week - continue to monitor. Currently not on diuretic.      Arnoldo Lenis, M.D.

## 2019-11-25 ENCOUNTER — Other Ambulatory Visit (INDEPENDENT_AMBULATORY_CARE_PROVIDER_SITE_OTHER): Payer: Self-pay | Admitting: Gastroenterology

## 2020-01-17 ENCOUNTER — Other Ambulatory Visit (INDEPENDENT_AMBULATORY_CARE_PROVIDER_SITE_OTHER): Payer: Self-pay | Admitting: Internal Medicine

## 2020-01-17 DIAGNOSIS — K219 Gastro-esophageal reflux disease without esophagitis: Secondary | ICD-10-CM

## 2020-01-17 DIAGNOSIS — K209 Esophagitis, unspecified without bleeding: Secondary | ICD-10-CM

## 2020-02-11 ENCOUNTER — Other Ambulatory Visit (INDEPENDENT_AMBULATORY_CARE_PROVIDER_SITE_OTHER): Payer: Self-pay | Admitting: Internal Medicine

## 2020-02-11 DIAGNOSIS — K209 Esophagitis, unspecified without bleeding: Secondary | ICD-10-CM

## 2020-02-11 DIAGNOSIS — K219 Gastro-esophageal reflux disease without esophagitis: Secondary | ICD-10-CM

## 2020-02-11 NOTE — Telephone Encounter (Signed)
08/28/1919 last seen

## 2020-03-24 ENCOUNTER — Other Ambulatory Visit (INDEPENDENT_AMBULATORY_CARE_PROVIDER_SITE_OTHER): Payer: Self-pay | Admitting: Internal Medicine

## 2020-03-24 DIAGNOSIS — K209 Esophagitis, unspecified without bleeding: Secondary | ICD-10-CM

## 2020-03-24 DIAGNOSIS — K219 Gastro-esophageal reflux disease without esophagitis: Secondary | ICD-10-CM

## 2020-03-25 NOTE — Telephone Encounter (Signed)
Last seen 08/28/2019 for loss of weight.

## 2020-09-11 ENCOUNTER — Other Ambulatory Visit (INDEPENDENT_AMBULATORY_CARE_PROVIDER_SITE_OTHER): Payer: Self-pay | Admitting: Internal Medicine

## 2020-09-11 DIAGNOSIS — K219 Gastro-esophageal reflux disease without esophagitis: Secondary | ICD-10-CM

## 2020-09-11 DIAGNOSIS — K209 Esophagitis, unspecified without bleeding: Secondary | ICD-10-CM

## 2020-11-02 ENCOUNTER — Ambulatory Visit (INDEPENDENT_AMBULATORY_CARE_PROVIDER_SITE_OTHER): Payer: Medicare HMO | Admitting: Gastroenterology

## 2020-11-02 NOTE — Progress Notes (Deleted)
Referring Provider: Alanson Puls [facility] Primary Care Physician:  [facility] Primary GI Physician:   No chief complaint on file.   HPI:   Jacqueline Hansen is a 80 y.o. female with past medical history of anxiety, arthritis, CHF, COPD, fibromyalgia, GERD, seizures, HH, high cholesterol, Melanoma, PE, Type II DM  Patient presenting today for ***   Problem:  Problem:  Problem:   Last Colonoscopy:(09/09/14) Single polyp was found in the sigmoid colon; (tubular adenoma) 2. Moderate diverticulosis was noted in the right colon 3. Multiple ulcers were found in the rectum , secondary to fecal impaction 4. The examination was otherwise normal Last Endoscopy:08/20/18- Normal proximal esophagus and distal esophagus. - Food in the mid esophagus. Cells for cytology obtained. - Z-line regular, 37 cm from the incisors. - Erosive gastropathy. - Large non-bleeding gastric ulcer with no stigmata of bleeding. (Chronic gastritis with ulceration) - Normal duodenal bulb and second portion of the duodenum.  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[place])    On home oxygen therapy    "2L ususally at night time" (09/08/2014)   Pulmonary embolism ([place])    "when I had my gallbladder taken out"   Type II diabetes mellitus ([place])     Past Surgical History:  Procedure Laterality Date   ABDOMINAL HYSTERECTOMY     BIOPSY  08/20/2018   Procedure: BIOPSY;  Surgeon: Rogene Houston, MD;  [facility];  Service: Endoscopy;;   CARPAL TUNNEL RELEASE Bilateral    CATARACT EXTRACTION W/ INTRAOCULAR LENS  IMPLANT, BILATERAL Bilateral    COLONOSCOPY N/A 09/09/2014   Procedure: COLONOSCOPY;  Surgeon: Irene Shipper, MD;  Location: [place];  Service: Endoscopy;  Laterality: N/A;   ESOPHAGOGASTRODUODENOSCOPY N/A 08/20/2018   Procedure: ESOPHAGOGASTRODUODENOSCOPY (EGD);  Surgeon: Rogene Houston, MD;  [facility];  Service: Endoscopy;  Laterality: N/A;   EXCISIONAL HEMORRHOIDECTOMY     INCISION AND DRAINAGE ABSCESS Right 08/09/2016   Procedure: INCISION AND DRAINAGE ABSCESS;  Surgeon: Aviva Signs, MD;  [facility];  Service: General;  Laterality: Right;   LAPAROSCOPIC CHOLECYSTECTOMY     MELANOMA EXCISION     "off my nose"   TUBAL LIGATION      Current Outpatient Medications  Medication Sig Dispense Refill   albuterol (PROVENTIL HFA;VENTOLIN HFA) 108 (90 Base) MCG/ACT inhaler Inhale 1-2 puffs into the lungs every 6 (six) hours as needed for wheezing or shortness of breath.     albuterol (PROVENTIL) (2.5 MG/3ML) 0.083% nebulizer solution Take 2.5 mg by nebulization every 6 (six) hours as needed for wheezing or shortness of breath.     ALPRAZolam (XANAX) 0.5 MG tablet Take 0.5 mg by mouth 2 (two) times daily.      amLODipine (NORVASC) 5 MG tablet Take 5 mg by  mouth daily.      apixaban (ELIQUIS) 5 MG TABS tablet Take 5 mg by mouth 2 (two) times daily.     bismuth subsalicylate (PEPTO BISMOL) 262 MG/15ML suspension Take 30 mLs by mouth every 6 (six) hours as needed for indigestion or diarrhea or loose stools.     DULoxetine (CYMBALTA) 30 MG capsule Take 60 mg by mouth daily.      fluticasone (FLONASE) 50 MCG/ACT nasal spray Place 2 sprays into both nostrils daily as needed for allergies.      Fluticasone-Salmeterol (ADVAIR) 500-50 MCG/DOSE AEPB Inhale 1 puff into the lungs daily.      furosemide (LASIX) 40 MG tablet Take 40 mg by mouth 2 (two)  times daily.      HYDROcodone-acetaminophen (NORCO/VICODIN) 5-325 MG tablet Take 1 tablet by mouth every 6 (six) hours as needed for moderate pain. (Patient taking differently: Take 1 tablet by mouth in the morning, at noon, and at bedtime. ) 10 tablet 0   insulin aspart (NOVOLOG) 100 UNIT/ML injection Inject 0-10 Units into the skin 3 (three) times daily with meals. For glucose 150 to 200 use 2 units, 201 to 250 use 4 units, 251-300 use 6 units, 301-350 use 8 units, 351 or greater 10 units. 10 mL 11   insulin glargine (LANTUS) 100 UNIT/ML injection Inject 0.2 mLs (20 Units total) into the skin at bedtime. (Patient taking differently: Inject 30 Units into the skin at bedtime. ) 10 mL 11   ketoconazole (NIZORAL) 2 % cream Apply 1 application topically daily as needed for irritation (APPLIED TO FEET).      loperamide (IMODIUM A-D) 2 MG tablet Take 2 mg by mouth 4 (four) times daily as needed for diarrhea or loose stools.     metoCLOPramide (REGLAN) 5 MG tablet TAKE 1 TABLET 4 TIMES A DAY, BEFORE MEALS AND AT BEDTIME. (Patient not taking: Reported on 09/16/2019) 120 tablet 0   ondansetron (ZOFRAN) 4 MG tablet TAKE 1 TABLET BY MOUTH EVERY 8 HOURS AS NEEDED FOR NAUSEA & VOMITING. 30 tablet 0   pantoprazole (PROTONIX) 40 MG tablet TAKE 1 TABLET ONCE DAILY. 30 tablet 5   potassium chloride (MICRO-K) 10 MEQ CR capsule Take 10 mEq by mouth daily.      rOPINIRole (REQUIP) 1 MG tablet Take 1 mg by mouth at bedtime.      Vitamin D, Ergocalciferol, (DRISDOL) 1.25 MG (50000 UNIT) CAPS capsule Take 50,000 Units by mouth every 7 (seven) days. Saturdays     No current facility-administered medications for this visit.    Allergies as of 11/02/2020 - Review Complete 09/16/2019  Allergen Reaction Noted   Adhesive [tape] Other (See Comments) 08/06/2016    Family History  Problem Relation Age of Onset   Heart disease Mother    AAA (abdominal aortic aneurysm) Mother        Died of aneurysm   Diabetes Sister     Lung cancer Sister     Social History   Socioeconomic History   Marital status: Divorced    Spouse name: Not on file   Number of children: Not on file   Years of education: Not on file   Highest education level: Not on file  Occupational History   Not on file  Tobacco Use   Smoking status: Every Day    Packs/day: 1.00    Years: 32.00    Pack years: 32.00    Types: Cigarettes   Smokeless tobacco: Never  Vaping Use   Vaping  Use: Not on file  Substance and Sexual Activity   Alcohol use: Not Currently    Comment: 09/08/2014 "I drank a little bit a long time ago"   Drug use: No   Sexual activity: Not Currently  Other Topics Concern   Not on file  Social History Narrative   Not on file   Social Determinants of Health   Financial Resource Strain: Not on file  Food Insecurity: Not on file  Transportation Needs: Not on file  Physical Activity: Not on file  Stress: Not on file  Social Connections: Not on file    Review of Systems: Gen: Denies fever, chills, anorexia. Denies fatigue, weakness, weight loss.  CV: Denies chest pain, palpitations, syncope, peripheral edema, and claudication. Resp: Denies dyspnea at rest, cough, wheezing, coughing up blood, and pleurisy. GI: Denies vomiting blood, jaundice, and fecal incontinence. Denies dysphagia or odynophagia. Derm: Denies rash, itching, dry skin Psych: Denies depression, anxiety, memory loss, confusion. No homicidal or suicidal ideation.  Heme: Denies bruising, bleeding, and enlarged lymph nodes.  Physical Exam: There were no vitals taken for this visit. General:   Alert and oriented. No distress noted. Pleasant and cooperative.  Head:  Normocephalic and atraumatic. Eyes:  Conjuctiva clear without scleral icterus. Mouth:  Oral mucosa pink and moist. Good dentition. No lesions. Heart: Normal rate and rhythm, s1 and s2 heart sounds present.  Lungs: Clear lung sounds in all lobes. Respirations equal and unlabored. Abdomen:   +BS, soft, non-tender and non-distended. No rebound or guarding. No HSM or masses noted. Derm: No palmar erythema or jaundice Msk:  Symmetrical without gross deformities. Normal posture. Extremities:  Without edema. Neurologic:  Alert and  oriented x4 Psych:  Alert and cooperative. Normal mood and affect.  Invalid input(s): 6 MONTHS   ASSESSMENT: Tahmina Frangella is a 80 y.o. female presenting today    PLAN:  1. 2. 3.  <ASA ***>  Hold anticoagulants/DM meds ***    Procedure instructions***   Follow Up: ***

## 2020-11-26 ENCOUNTER — Other Ambulatory Visit: Payer: Self-pay

## 2020-11-26 ENCOUNTER — Encounter (HOSPITAL_COMMUNITY): Payer: Self-pay | Admitting: *Deleted

## 2020-11-26 ENCOUNTER — Emergency Department (HOSPITAL_COMMUNITY): Payer: 59

## 2020-11-26 ENCOUNTER — Inpatient Hospital Stay (HOSPITAL_COMMUNITY)
Admission: EM | Admit: 2020-11-26 | Discharge: 2020-12-05 | DRG: 871 | Disposition: E | Payer: 59 | Attending: Internal Medicine | Admitting: Internal Medicine

## 2020-11-26 DIAGNOSIS — Z515 Encounter for palliative care: Secondary | ICD-10-CM | POA: Diagnosis not present

## 2020-11-26 DIAGNOSIS — E1165 Type 2 diabetes mellitus with hyperglycemia: Secondary | ICD-10-CM | POA: Diagnosis present

## 2020-11-26 DIAGNOSIS — I69344 Monoplegia of lower limb following cerebral infarction affecting left non-dominant side: Secondary | ICD-10-CM | POA: Diagnosis not present

## 2020-11-26 DIAGNOSIS — N179 Acute kidney failure, unspecified: Secondary | ICD-10-CM | POA: Diagnosis present

## 2020-11-26 DIAGNOSIS — R6521 Severe sepsis with septic shock: Secondary | ICD-10-CM | POA: Diagnosis present

## 2020-11-26 DIAGNOSIS — F32A Depression, unspecified: Secondary | ICD-10-CM | POA: Diagnosis present

## 2020-11-26 DIAGNOSIS — J449 Chronic obstructive pulmonary disease, unspecified: Secondary | ICD-10-CM | POA: Diagnosis not present

## 2020-11-26 DIAGNOSIS — J9621 Acute and chronic respiratory failure with hypoxia: Secondary | ICD-10-CM | POA: Diagnosis present

## 2020-11-26 DIAGNOSIS — N390 Urinary tract infection, site not specified: Secondary | ICD-10-CM | POA: Diagnosis present

## 2020-11-26 DIAGNOSIS — M797 Fibromyalgia: Secondary | ICD-10-CM | POA: Diagnosis present

## 2020-11-26 DIAGNOSIS — D509 Iron deficiency anemia, unspecified: Secondary | ICD-10-CM | POA: Diagnosis present

## 2020-11-26 DIAGNOSIS — J189 Pneumonia, unspecified organism: Secondary | ICD-10-CM | POA: Diagnosis present

## 2020-11-26 DIAGNOSIS — G2581 Restless legs syndrome: Secondary | ICD-10-CM | POA: Diagnosis present

## 2020-11-26 DIAGNOSIS — T68XXXA Hypothermia, initial encounter: Secondary | ICD-10-CM

## 2020-11-26 DIAGNOSIS — Z85820 Personal history of malignant melanoma of skin: Secondary | ICD-10-CM

## 2020-11-26 DIAGNOSIS — I480 Paroxysmal atrial fibrillation: Secondary | ICD-10-CM | POA: Diagnosis present

## 2020-11-26 DIAGNOSIS — E871 Hypo-osmolality and hyponatremia: Secondary | ICD-10-CM | POA: Diagnosis present

## 2020-11-26 DIAGNOSIS — Z86711 Personal history of pulmonary embolism: Secondary | ICD-10-CM | POA: Diagnosis not present

## 2020-11-26 DIAGNOSIS — I69349 Monoplegia of lower limb following cerebral infarction affecting unspecified side: Secondary | ICD-10-CM

## 2020-11-26 DIAGNOSIS — E7800 Pure hypercholesterolemia, unspecified: Secondary | ICD-10-CM | POA: Diagnosis present

## 2020-11-26 DIAGNOSIS — G40409 Other generalized epilepsy and epileptic syndromes, not intractable, without status epilepticus: Secondary | ICD-10-CM | POA: Diagnosis present

## 2020-11-26 DIAGNOSIS — E46 Unspecified protein-calorie malnutrition: Secondary | ICD-10-CM | POA: Diagnosis not present

## 2020-11-26 DIAGNOSIS — Z833 Family history of diabetes mellitus: Secondary | ICD-10-CM

## 2020-11-26 DIAGNOSIS — Z6833 Body mass index (BMI) 33.0-33.9, adult: Secondary | ICD-10-CM | POA: Diagnosis not present

## 2020-11-26 DIAGNOSIS — Z66 Do not resuscitate: Secondary | ICD-10-CM | POA: Diagnosis not present

## 2020-11-26 DIAGNOSIS — Z8711 Personal history of peptic ulcer disease: Secondary | ICD-10-CM

## 2020-11-26 DIAGNOSIS — Z20822 Contact with and (suspected) exposure to covid-19: Secondary | ICD-10-CM | POA: Diagnosis present

## 2020-11-26 DIAGNOSIS — Z90710 Acquired absence of both cervix and uterus: Secondary | ICD-10-CM

## 2020-11-26 DIAGNOSIS — G9341 Metabolic encephalopathy: Secondary | ICD-10-CM | POA: Diagnosis present

## 2020-11-26 DIAGNOSIS — I5033 Acute on chronic diastolic (congestive) heart failure: Secondary | ICD-10-CM | POA: Diagnosis present

## 2020-11-26 DIAGNOSIS — J440 Chronic obstructive pulmonary disease with acute lower respiratory infection: Secondary | ICD-10-CM | POA: Diagnosis present

## 2020-11-26 DIAGNOSIS — R0603 Acute respiratory distress: Secondary | ICD-10-CM

## 2020-11-26 DIAGNOSIS — R4182 Altered mental status, unspecified: Secondary | ICD-10-CM | POA: Diagnosis present

## 2020-11-26 DIAGNOSIS — E43 Unspecified severe protein-calorie malnutrition: Secondary | ICD-10-CM | POA: Diagnosis present

## 2020-11-26 DIAGNOSIS — F17210 Nicotine dependence, cigarettes, uncomplicated: Secondary | ICD-10-CM | POA: Diagnosis present

## 2020-11-26 DIAGNOSIS — I472 Ventricular tachycardia: Secondary | ICD-10-CM | POA: Diagnosis not present

## 2020-11-26 DIAGNOSIS — R54 Age-related physical debility: Secondary | ICD-10-CM | POA: Diagnosis present

## 2020-11-26 DIAGNOSIS — A419 Sepsis, unspecified organism: Secondary | ICD-10-CM | POA: Diagnosis present

## 2020-11-26 DIAGNOSIS — D75839 Thrombocytosis, unspecified: Secondary | ICD-10-CM | POA: Diagnosis present

## 2020-11-26 DIAGNOSIS — Z8249 Family history of ischemic heart disease and other diseases of the circulatory system: Secondary | ICD-10-CM

## 2020-11-26 DIAGNOSIS — E872 Acidosis, unspecified: Secondary | ICD-10-CM

## 2020-11-26 DIAGNOSIS — F419 Anxiety disorder, unspecified: Secondary | ICD-10-CM | POA: Diagnosis present

## 2020-11-26 DIAGNOSIS — D72823 Leukemoid reaction: Secondary | ICD-10-CM | POA: Diagnosis present

## 2020-11-26 DIAGNOSIS — E875 Hyperkalemia: Secondary | ICD-10-CM | POA: Diagnosis present

## 2020-11-26 DIAGNOSIS — I5031 Acute diastolic (congestive) heart failure: Secondary | ICD-10-CM | POA: Diagnosis not present

## 2020-11-26 DIAGNOSIS — D72829 Elevated white blood cell count, unspecified: Secondary | ICD-10-CM

## 2020-11-26 DIAGNOSIS — E8809 Other disorders of plasma-protein metabolism, not elsewhere classified: Secondary | ICD-10-CM | POA: Diagnosis present

## 2020-11-26 DIAGNOSIS — E669 Obesity, unspecified: Secondary | ICD-10-CM | POA: Diagnosis present

## 2020-11-26 DIAGNOSIS — R0600 Dyspnea, unspecified: Secondary | ICD-10-CM

## 2020-11-26 DIAGNOSIS — E860 Dehydration: Secondary | ICD-10-CM | POA: Diagnosis present

## 2020-11-26 DIAGNOSIS — Z7901 Long term (current) use of anticoagulants: Secondary | ICD-10-CM

## 2020-11-26 DIAGNOSIS — Z7189 Other specified counseling: Secondary | ICD-10-CM | POA: Diagnosis not present

## 2020-11-26 DIAGNOSIS — J181 Lobar pneumonia, unspecified organism: Secondary | ICD-10-CM | POA: Diagnosis not present

## 2020-11-26 DIAGNOSIS — Z801 Family history of malignant neoplasm of trachea, bronchus and lung: Secondary | ICD-10-CM

## 2020-11-26 DIAGNOSIS — K219 Gastro-esophageal reflux disease without esophagitis: Secondary | ICD-10-CM | POA: Diagnosis present

## 2020-11-26 DIAGNOSIS — D3502 Benign neoplasm of left adrenal gland: Secondary | ICD-10-CM | POA: Diagnosis present

## 2020-11-26 DIAGNOSIS — N3000 Acute cystitis without hematuria: Secondary | ICD-10-CM | POA: Diagnosis not present

## 2020-11-26 DIAGNOSIS — E876 Hypokalemia: Secondary | ICD-10-CM | POA: Diagnosis not present

## 2020-11-26 DIAGNOSIS — N10 Acute tubulo-interstitial nephritis: Secondary | ICD-10-CM | POA: Diagnosis not present

## 2020-11-26 LAB — COMPREHENSIVE METABOLIC PANEL
ALT: 17 U/L (ref 0–44)
AST: 21 U/L (ref 15–41)
Albumin: 1.6 g/dL — ABNORMAL LOW (ref 3.5–5.0)
Alkaline Phosphatase: 151 U/L — ABNORMAL HIGH (ref 38–126)
Anion gap: 10 (ref 5–15)
BUN: 38 mg/dL — ABNORMAL HIGH (ref 8–23)
CO2: 23 mmol/L (ref 22–32)
Calcium: 7.8 mg/dL — ABNORMAL LOW (ref 8.9–10.3)
Chloride: 97 mmol/L — ABNORMAL LOW (ref 98–111)
Creatinine, Ser: 1.53 mg/dL — ABNORMAL HIGH (ref 0.44–1.00)
GFR, Estimated: 34 mL/min — ABNORMAL LOW (ref >60)
Glucose, Bld: 178 mg/dL — ABNORMAL HIGH (ref 70–99)
Potassium: 6.5 mmol/L (ref 3.5–5.1)
Sodium: 130 mmol/L — ABNORMAL LOW (ref 135–145)
Total Bilirubin: 0.5 mg/dL (ref 0.3–1.2)
Total Protein: 5.3 g/dL — ABNORMAL LOW (ref 6.5–8.1)

## 2020-11-26 LAB — CBC WITH DIFFERENTIAL/PLATELET
Abs Immature Granulocytes: 1.47 10*3/uL — ABNORMAL HIGH (ref 0.00–0.07)
Basophils Absolute: 0.2 10*3/uL — ABNORMAL HIGH (ref 0.0–0.1)
Basophils Relative: 0 %
Eosinophils Absolute: 0 10*3/uL (ref 0.0–0.5)
Eosinophils Relative: 0 %
HCT: 37.9 % (ref 36.0–46.0)
Hemoglobin: 10.6 g/dL — ABNORMAL LOW (ref 12.0–15.0)
Immature Granulocytes: 3 %
Lymphocytes Relative: 2 %
Lymphs Abs: 0.7 10*3/uL (ref 0.7–4.0)
MCH: 21.9 pg — ABNORMAL LOW (ref 26.0–34.0)
MCHC: 28 g/dL — ABNORMAL LOW (ref 30.0–36.0)
MCV: 78.1 fL — ABNORMAL LOW (ref 80.0–100.0)
Monocytes Absolute: 1 10*3/uL (ref 0.1–1.0)
Monocytes Relative: 2 %
Neutro Abs: 41 10*3/uL — ABNORMAL HIGH (ref 1.7–7.7)
Neutrophils Relative %: 93 %
Platelets: 573 10*3/uL — ABNORMAL HIGH (ref 150–400)
RBC: 4.85 MIL/uL (ref 3.87–5.11)
RDW: 24.9 % — ABNORMAL HIGH (ref 11.5–15.5)
WBC: 44.3 10*3/uL — ABNORMAL HIGH (ref 4.0–10.5)
nRBC: 0 % (ref 0.0–0.2)

## 2020-11-26 LAB — BASIC METABOLIC PANEL
Anion gap: 7 (ref 5–15)
BUN: 35 mg/dL — ABNORMAL HIGH (ref 8–23)
CO2: 21 mmol/L — ABNORMAL LOW (ref 22–32)
Calcium: 7.4 mg/dL — ABNORMAL LOW (ref 8.9–10.3)
Chloride: 103 mmol/L (ref 98–111)
Creatinine, Ser: 1.41 mg/dL — ABNORMAL HIGH (ref 0.44–1.00)
GFR, Estimated: 38 mL/min — ABNORMAL LOW (ref >60)
Glucose, Bld: 164 mg/dL — ABNORMAL HIGH (ref 70–99)
Potassium: 6.5 mmol/L (ref 3.5–5.1)
Sodium: 131 mmol/L — ABNORMAL LOW (ref 135–145)

## 2020-11-26 LAB — LACTIC ACID, PLASMA
Lactic Acid, Venous: 3.6 mmol/L (ref 0.5–1.9)
Lactic Acid, Venous: 3.8 mmol/L (ref 0.5–1.9)

## 2020-11-26 LAB — URINALYSIS, ROUTINE W REFLEX MICROSCOPIC
Glucose, UA: NEGATIVE mg/dL
Hgb urine dipstick: NEGATIVE
Ketones, ur: 5 mg/dL — AB
Nitrite: NEGATIVE
Protein, ur: 30 mg/dL — AB
Specific Gravity, Urine: 1.025 (ref 1.005–1.030)
WBC, UA: >50 WBC/hpf — ABNORMAL HIGH (ref 0–5)
pH: 5 (ref 5.0–8.0)

## 2020-11-26 LAB — APTT: aPTT: 29 seconds (ref 24–36)

## 2020-11-26 LAB — PROTIME-INR
INR: 1 (ref 0.8–1.2)
Prothrombin Time: 13.4 seconds (ref 11.4–15.2)

## 2020-11-26 MED ORDER — DEXTROSE 50 % IV SOLN
50.0000 mL | Freq: Once | INTRAVENOUS | Status: AC
  Administered 2020-11-26: 50 mL via INTRAVENOUS
  Filled 2020-11-26: qty 50

## 2020-11-26 MED ORDER — INSULIN ASPART 100 UNIT/ML IV SOLN
10.0000 [IU] | Freq: Once | INTRAVENOUS | Status: AC
  Administered 2020-11-26: 10 [IU] via INTRAVENOUS

## 2020-11-26 MED ORDER — SODIUM BICARBONATE 8.4 % IV SOLN
50.0000 meq | Freq: Once | INTRAVENOUS | Status: AC
  Administered 2020-11-26: 50 meq via INTRAVENOUS

## 2020-11-26 MED ORDER — LACTATED RINGERS IV BOLUS (SEPSIS)
1000.0000 mL | Freq: Once | INTRAVENOUS | Status: AC
  Administered 2020-11-26: 1000 mL via INTRAVENOUS

## 2020-11-26 MED ORDER — LACTATED RINGERS IV SOLN: INTRAVENOUS | Status: DC

## 2020-11-26 MED ORDER — NOREPINEPHRINE 4 MG/250ML-% IV SOLN
0.0000 ug/min | INTRAVENOUS | Status: DC
  Administered 2020-11-27: 2 ug/min via INTRAVENOUS
  Filled 2020-11-26: qty 250

## 2020-11-26 MED ORDER — SODIUM CHLORIDE 0.9 % IV SOLN
2.0000 g | INTRAVENOUS | Status: DC
  Administered 2020-11-26: 2 g via INTRAVENOUS
  Filled 2020-11-26: qty 20

## 2020-11-26 MED ORDER — CALCIUM GLUCONATE-NACL 1-0.675 GM/50ML-% IV SOLN
1.0000 g | Freq: Once | INTRAVENOUS | Status: AC
  Administered 2020-11-26: 1000 mg via INTRAVENOUS
  Filled 2020-11-26: qty 50

## 2020-11-26 MED ORDER — AZITHROMYCIN 500 MG IV SOLR
500.0000 mg | INTRAVENOUS | Status: DC
  Administered 2020-11-26: 500 mg via INTRAVENOUS
  Filled 2020-11-26: qty 500

## 2020-11-26 MED ORDER — LACTATED RINGERS IV BOLUS (SEPSIS)
500.0000 mL | Freq: Once | INTRAVENOUS | Status: AC
  Administered 2020-11-26: 500 mL via INTRAVENOUS

## 2020-11-26 NOTE — Sepsis Progress Note (Signed)
Code sepsis protocol being monitored be eLink

## 2020-11-26 NOTE — H&P (Addendum)
History and Physical  Jacqueline Hansen XTK:[id number] DOB: [birth year] DOA: 11/30/2020  Referring physician: Noemi Chapel, MD PCP: Alanson Puls, [facility]  Patient coming from: Home  Chief Complaint: Altered mental status  HPI: Jacqueline Hansen is a 80 y.o. female with medical history significant for anxiety, depression, osteoarthritis, asthma, diastolic heart failure, recurrent episodes of bronchitis, COPD, fibromyalgia, gastric ulcer, GERD, hiatal hernia, grand mal seizure, hyperlipidemia, history of pulmonary embolism, type 2 diabetes who presents to the emergency department from home via EMS due to low blood pressure, generalized weakness and ill appearing which occurred today.  Patient was unable to provide history possibly due to her current medical condition, history was obtained from ED physician and ED medical record.  Per report, patient was noted to be in respiratory distress and hypoxic when EMS arrived at her home and she was placed on an NRB (patient was initially on supplemental oxygen at 2 LPM at night).    ED Course:  In the emergency department, she was tachycardic.  Temperature was 65F but this subsequently dropped to 95.80F, but O2 sat was 97 to 100% on supplemental oxygen via nasal 5 LPM.  Work-up in the ED showed leukocytosis, thrombocytosis, microcytic anemia, hyponatremia, hyperkalemia, BUN/creatinine 38/1.53 (this was 0.6 on 09/30/2019).  Lactic acid 3.6 > 3.8, hyperglycemia, albumin 1.6, ALP 151.  Urinalysis was positive for UTI CT abdomen and pelvis without contrast showed no acute intra-abdominal pelvic pathology.  No bowel obstruction.  But showed partially visualized small bilateral pleural effusions with near complete consolidative changes of the lower lobes which may represent atelectasis or infiltrate. Chest x-ray showed improvement in the lung base opacity with persistent bilateral pleural effusions and atelectasis.  Overall improved her aeration from prior imaging Patient was  empirically started on IV ceftriaxone and azithromycin.  Calcium gluconate was given to stabilize the heart due to hyperkalemia, insulin and dextrose were given, sodium bicarbonate was also given.  IV hydration per sepsis protocol was provided.  Patient was started on IV Levophed due to soft BP and Lasix was given due to bilateral pleural effusions.  Hospitalist was asked to admit patient for further evaluation and management.  Review of Systems: This cannot be obtained at this time due to patient's current condition   Past Medical History:  Diagnosis Date   Anxiety    Arthritis    "joints" (09/08/2014)   Asthma    CHF (congestive heart failure) ([place])    Chronic bronchitis ([place])    "get it q yr"   COPD (chronic obstructive pulmonary disease) ([place])    Depression    Fibromyalgia    Gastric ulcer    GERD (gastroesophageal reflux disease)    Grand mal seizure ([place])    "last one was in the 1970's" (09/08/2014)   History of blood transfusion ?2015   "cause I was throwing it up"   History of hiatal hernia    Hypercholesterolemia    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[place])    On home oxygen therapy    "2L ususally at night time" (09/08/2014)   Pulmonary embolism ([place])    "when I had my gallbladder taken out"   Type II diabetes mellitus ([place])    Past Surgical History:  Procedure Laterality Date   ABDOMINAL HYSTERECTOMY     BIOPSY  08/20/2018   Procedure: BIOPSY;  Surgeon: Rogene Houston, MD;  Location[facility];  Service: Endoscopy;;   CARPAL TUNNEL RELEASE Bilateral    CATARACT EXTRACTION W/ INTRAOCULAR LENS  IMPLANT, BILATERAL  Bilateral    COLONOSCOPY N/A 09/09/2014   Procedure: COLONOSCOPY;  Surgeon: Irene Shipper, MD;  Location: [facility];  Service: Endoscopy;  Laterality: N/A;   ESOPHAGOGASTRODUODENOSCOPY N/A 08/20/2018   Procedure: ESOPHAGOGASTRODUODENOSCOPY (EGD);  Surgeon: Rogene Houston, MD;  Location: [facility];  Service: Endoscopy;  Laterality: N/A;   EXCISIONAL HEMORRHOIDECTOMY      INCISION AND DRAINAGE ABSCESS Right 08/09/2016   Procedure: INCISION AND DRAINAGE ABSCESS;  Surgeon: Aviva Signs, MD;  Location: [facility];  Service: General;  Laterality: Right;   LAPAROSCOPIC CHOLECYSTECTOMY     MELANOMA EXCISION     "off my nose"   TUBAL LIGATION      Social History:  reports that she has been smoking cigarettes. She has a 32.00 pack-year smoking history. She has never used smokeless tobacco. She reports that she does not currently use alcohol. She reports that she does not use drugs.   Allergies  Allergen Reactions   Adhesive [Tape] Other (See Comments)    Reaction:  Tears pts skin     Family History  Problem Relation Age of Onset   Heart disease Mother    AAA (abdominal aortic aneurysm) Mother        Died of aneurysm   Diabetes Sister    Lung cancer Sister      Prior to Admission medications   Medication Sig Start Date End Date Taking? Authorizing Provider  albuterol (PROVENTIL HFA;VENTOLIN HFA) 108 (90 Base) MCG/ACT inhaler Inhale 1-2 puffs into the lungs every 6 (six) hours as needed for wheezing or shortness of breath.    [provider]  albuterol (PROVENTIL) (2.5 MG/3ML) 0.083% nebulizer solution Take 2.5 mg by nebulization every 6 (six) hours as needed for wheezing or shortness of breath.    [provider]  ALPRAZolam Duanne Moron) 0.5 MG tablet Take 0.5 mg by mouth 2 (two) times daily.     [provider]  amLODipine (NORVASC) 5 MG tablet Take 5 mg by mouth daily.  06/21/19   [provider]  apixaban (ELIQUIS) 5 MG TABS tablet Take 5 mg by mouth 2 (two) times daily.    [provider]  bismuth subsalicylate (PEPTO BISMOL) 262 MG/15ML suspension Take 30 mLs by mouth every 6 (six) hours as needed for indigestion or diarrhea or loose stools.    [provider]  DULoxetine (CYMBALTA) 30 MG capsule Take 60 mg by mouth daily.     [provider]  fluticasone (FLONASE) 50 MCG/ACT nasal spray Place 2 sprays  into both nostrils daily as needed for allergies.     [provider]  Fluticasone-Salmeterol (ADVAIR) 500-50 MCG/DOSE AEPB Inhale 1 puff into the lungs daily.     [provider]  furosemide (LASIX) 40 MG tablet Take 40 mg by mouth 2 (two) times daily.  08/15/19   [provider]  HYDROcodone-acetaminophen (NORCO/VICODIN) 5-325 MG tablet Take 1 tablet by mouth every 6 (six) hours as needed for moderate pain. Patient taking differently: Take 1 tablet by mouth in the morning, at noon, and at bedtime.  08/26/16   Arrien, Jimmy Picket, MD  insulin aspart (NOVOLOG) 100 UNIT/ML injection Inject 0-10 Units into the skin 3 (three) times daily with meals. For glucose 150 to 200 use 2 units, 201 to 250 use 4 units, 251-300 use 6 units, 301-350 use 8 units, 351 or greater 10 units. 08/26/16   Arrien, Jimmy Picket, MD  insulin glargine (LANTUS) 100 UNIT/ML injection Inject 0.2 mLs (20 Units total)  into the skin at bedtime. Patient taking differently: Inject 30 Units into the skin at bedtime.  08/11/16   Orvan Falconer, MD  ketoconazole (NIZORAL) 2 % cream Apply 1 application topically daily as needed for irritation (APPLIED TO FEET).  07/27/16   [provider]  loperamide (IMODIUM A-D) 2 MG tablet Take 2 mg by mouth 4 (four) times daily as needed for diarrhea or loose stools.    [provider]  metoCLOPramide (REGLAN) 5 MG tablet TAKE 1 TABLET 4 TIMES A DAY, BEFORE MEALS AND AT BEDTIME. Patient not taking: Reported on 09/16/2019 08/30/19   Laurine Blazer B, PA-C  ondansetron (ZOFRAN) 4 MG tablet TAKE 1 TABLET BY MOUTH EVERY 8 HOURS AS NEEDED FOR NAUSEA & VOMITING. 11/25/19   Laurine Blazer B, PA-C  pantoprazole (PROTONIX) 40 MG tablet TAKE 1 TABLET ONCE DAILY. 09/14/20   Harvel Quale, MD  potassium chloride (MICRO-K) 10 MEQ CR capsule Take 10 mEq by mouth daily.  07/31/19   [provider]  rOPINIRole (REQUIP) 1 MG tablet Take 1 mg by mouth at bedtime.   08/02/19   [provider]  Vitamin D, Ergocalciferol, (DRISDOL) 1.25 MG (50000 UNIT) CAPS capsule Take 50,000 Units by mouth every 7 (seven) days. Saturdays    [provider]    Physical Exam: BP (!) 98/54   Pulse (!) 121   Temp (!) 95.4 F (35.2 C) (Rectal)   Resp 20   Ht 5' (1.524 m)   SpO2 97%   BMI 33.98 kg/m   General: 80 y.o. year-old female ill appearing in no acute distress.  Alert and awake HEENT: Dry mucous membrane.  NCAT, EOMI Neck: Supple, trachea medial Cardiovascular: Tachycardic.  Regular rate and rhythm with no rubs or gallops.  No thyromegaly or JVD noted.  No lower extremity edema. 2/4 pulses in all 4 extremities. Respiratory: Bilateral coarse breath sounds with bilateral rales in lower lobes.  Abdomen: Soft, nontender nondistended with normal bowel sounds x4 quadrants. Muskuloskeletal: Bilateral lower extremity edema. IO noted on LLE. No clubbing noted bilaterally Neuro: Able to move 4 extremities.  She was only able to whisper (not audible).  Sensation, reflexes intact Skin: No ulcerative lesions noted or rashes Psychiatry: Mood is appropriate for condition and setting          Labs on Admission:  Basic Metabolic Panel: Recent Labs  Lab 12/03/2020 1741 11/17/2020 1947  NA 130* 131*  K 6.5* 6.5*  CL 97* 103  CO2 23 21*  GLUCOSE 178* 164*  BUN 38* 35*  CREATININE 1.53* 1.41*  CALCIUM 7.8* 7.4*   Liver Function Tests: Recent Labs  Lab 11/11/2020 1741  AST 21  ALT 17  ALKPHOS 151*  BILITOT 0.5  PROT 5.3*  ALBUMIN 1.6*   No results for input(s): LIPASE, AMYLASE in the last 168 hours. No results for input(s): AMMONIA in the last 168 hours. CBC: Recent Labs  Lab 12/03/2020 1741  WBC 44.3*  NEUTROABS 41.0*  HGB 10.6*  HCT 37.9  MCV 78.1*  PLT 573*   Cardiac Enzymes: No results for input(s): CKTOTAL, CKMB, CKMBINDEX, TROPONINI in the last 168 hours.  BNP (last 3 results) No results for input(s): BNP in the last 8760  hours.  ProBNP (last 3 results) No results for input(s): PROBNP in the last 8760 hours.  CBG: No results for input(s): GLUCAP in the last 168 hours.  Radiological Exams on Admission: CT ABDOMEN PELVIS WO CONTRAST  Result Date: 11/27/2020 CLINICAL DATA:  Sepsis.  EXAM: CT ABDOMEN AND PELVIS WITHOUT CONTRAST TECHNIQUE: Multidetector CT imaging of the abdomen and pelvis was performed following the standard protocol without IV contrast. COMPARISON:  CT abdomen pelvis dated 08/18/2018. FINDINGS: Evaluation of this exam is limited in the absence of intravenous contrast. Lower chest: Partially visualized small bilateral pleural effusions with near complete consolidative changes of the lower lobes which may represent atelectasis or infiltrate. There is coronary vascular calcification. No intra-abdominal free air or free fluid. Hepatobiliary: The liver is unremarkable. No intrahepatic biliary dilatation. Cholecystectomy. No retained calcified stone noted in the central CBD. Pancreas: The pancreas is atrophic.  No active inflammatory changes. Spleen: Normal in size without focal abnormality. Adrenals/Urinary Tract: The right adrenal gland is unremarkable. 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 There is no hydronephrosis or nephrolithiasis on either side. Interval increase in the size of the right renal interpolar exophytic low attenuating lesion now measuring 2.3 cm. This is suboptimally characterized but demonstrates fluid attenuation, likely a cyst. The visualized ureters and urinary bladder appear unremarkable. Stomach/Bowel: There is no bowel obstruction or active inflammation. 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 The appendix is not visualized with certainty. No inflammatory changes identified in the right lower quadrant. Vascular/Lymphatic: Advanced aortoiliac atherosclerotic disease. The IVC is unremarkable. No portal venous gas. There is no adenopathy. Reproductive: Hysterectomy.  No  adnexal masses. Other: Small fat containing umbilical hernia. Musculoskeletal: Osteopenia with degenerative changes of the spine. No acute osseous pathology. IMPRESSION: 1. No acute intra-abdominal or pelvic pathology. No bowel obstruction. 2. Partially visualized small bilateral pleural effusions with near complete consolidative changes of the lower lobes which may represent atelectasis or infiltrate. 3. Left adrenal adenoma. 4. Aortic Atherosclerosis (ICD10-I70.0). Electronically Signed   By: Anner Crete M.D.   On: 11/27/2020 00:10   DG Chest Port 1 View  Result Date: 11/29/2020 CLINICAL DATA:  Questionable sepsis - evaluate for abnormality EXAM: PORTABLE CHEST 1 VIEW COMPARISON:  10/29/2020 FINDINGS: Right upper extremity PICC has been removed. Improved hazy lung base opacities with persistent bilateral pleural effusion. Fluid tracks into the right minor fissure. Associated bibasilar opacities favor atelectasis, overall improved from prior. Upper normal heart size. Aortic atherosclerosis. No pneumothorax. IMPRESSION: Improved hazy lung base opacities with persistent bilateral pleural effusions and atelectasis. Overall improved aeration from prior imaging. Electronically Signed   By: Keith Rake M.D.   On: 11/09/2020 19:49    EKG: I independently viewed the EKG done and my findings are as followed: Junctional tachycardia at a rate of 114 bpm  Assessment/Plan Present on Admission:  UTI (urinary tract infection)  Sepsis due to undetermined organism ([place])  Leukocytosis  AKI (acute kidney injury) ([facility])  COPD (chronic obstructive pulmonary disease) ([facility])  GERD (gastroesophageal reflux disease)  Principal Problem:   UTI (urinary tract infection) Active Problems:   COPD (chronic obstructive pulmonary disease) ([facility])   Sepsis due to undetermined organism ([facility])   GERD (gastroesophageal reflux disease)   AKI (acute kidney injury) ([place])   Microcytic anemia   Leukocytosis   Acute metabolic  encephalopathy   Lactic acidosis   Thrombocytosis   Hypoalbuminemia due to protein-calorie malnutrition ([facility])   Hyperglycemia due to diabetes mellitus ([facility])   Adrenal adenoma, left   Acute on chronic respiratory failure with hypoxia ([facility])   Hyperkalemia   Hypothermia   Restless leg syndrome   History of pulmonary embolism  Acute metabolic encephalopathy possibly secondary to sepsis due to undetermined organism UTI POA Patient presents with  leukocytosis, hypothermia (met SIRS criteria), urinalysis was positive for UTI, lactic acidosis was present thereby meeting sepsis criteria. Patient was empirically started on IV ceftriaxone and azithromycin MRSA by PCR was positive.  We shall continue with Vanco and cefepime at this time Blood culture and urine culture pending.  Leukocytosis possible secondary to above 2 Continue management as described above  Acute on chronic respiratory failure possibly secondary to bilateral pleural effusion Patient normally disease supplemental oxygen at 2 LPM only at night, she required oxygen via NRB at 15 L/min prior to being weaned down to 5 LPM. Continue supplemental oxygen and wean patient down to home oxygen requirement as tolerated. CT abdomen and pelvis showed small bilateral pleural effusions with near complete consolidative changes of the lower lobes which may represent atelectasis or infiltrate. A trial of IV Lasix 20x1 resulted in no increased urinary output possibly due to patient still being dehydrated Consider thoracentesis if no improvement in bilateral pleural effusion  Lactic acidosis possibly secondary to multifactorial This may be due to sepsis as mentioned above or due to hypoxia Lactic acid 3.6 > 3.8, continue to trend lactic acid  Hypothermia Continue warm blankets  Hyperkalemia K+ 6.5, calcium gluconate was given Sodium carbonate was given IV insulin and D50 were given No EKG changes Continue to monitor potassium level with  morning labs.  Hypomagnesemia Mg 1.4 This will be replenished Please continue to monitor Mg level and correct accordingly  Microcytic anemia MCV 78.1, hemoglobin at 10.6 Iron studies will be done  Acute kidney injury  BUN/creatinine 38/1.53 (this was 0.6 on 09/30/2019) Renally adjust medications, avoid nephrotoxic agents/dehydration/hypotension  Thrombocytosis (chronic) Platelets 573, continue to monitor platelet levels morning labs  Hyperglycemia secondary to type 2 diabetes mellitus Continue ISS and hypoglycemic protocol  hypoalbuminemia secondary to severe protein calorie malnutrition Albumin 1.6, consider starting protein supplement once patient is more alert to tolerate oral intake  GERD Continue Protonix  COPD (not in acute exacerbation) Continue home meds when patient is able to cooperate  History of PE Continue Eliquis  Resting leg syndrome Continue home ropinirole  Left adrenal adenoma per CT abdomen and pelvis  Goals of care Palliative Care will be consulted  DVT prophylaxis: Eliquis  Code Status: Full code  Family Communication: None at bedside  Disposition Plan:  Patient is from:                        home Anticipated DC to:                   SNF or family members home Anticipated DC date:               2-3 days Anticipated DC barriers:          Patient requires inpatient management due to acute metabolic encephalopathy secondary to sepsis and UTI POA as well as acute on chronic respiratory failure with hypoxia requiring increased oxygen supplementation  Consults called: None  Admission status: Inpatient    Bernadette Hoit MD [facility]  11/27/2020, 4:05 AM

## 2020-11-26 NOTE — ED Notes (Signed)
Pt's right ultrasound guided 1.5in 20g IV infiltrated, pt does have swelling to IV site, IV removed, warm pac applied

## 2020-11-26 NOTE — Progress Notes (Signed)
Notified provider of need to order repeat lactic acid. ° °

## 2020-11-26 NOTE — ED Notes (Signed)
EMS given pt rocephin 1 gm IM en route

## 2020-11-26 NOTE — ED Triage Notes (Addendum)
Pt last well seen today was at 1430 and symptoms noted at 1630 per daughter.  CBG 106  reported that pt not able to speak but able to move her mouth. Left weak sided weakness reported by daughter. Fire dept applied NRB at home and EMS reports sats at 77%, en route here pt with sats increased to 89%

## 2020-11-26 NOTE — ED Provider Notes (Addendum)
[facility] EMERGENCY DEPARTMENT Provider Note   CSN: [id number] Arrival date & time: 11/09/2020  1659     History Chief Complaint  Patient presents with   Altered Mental Status    Jacqueline Hansen is a 80 y.o. female.   Altered Mental Status  This patient is a very sick 80 year old female presenting from home, evidently the patient has had some progressive decline, was found to be severely weak hypotensive hypoxic and ill-appearing today.  The patient is so ill she cannot give me any information, she tries to talk but her voice is so quiet that none of her speech is recognizable.  There was a possibility that she had some left-sided weakness however she is not weak on my exam, the paramedics found her to be in respiratory distress and hypoxic and placed on a nonrebreather.  She is on 2 L at nighttime.  She does have a history of a pulmonary embolism when she had a gallbladder removal.  She is a diabetic, she has COPD, she has congestive heart failure.  According to the medical record the patient is on multiple medications including insulin, ropinirole, pantoprazole, also taking amlodipine and Eliquis.  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)    "last one was in the 1970's" (09/08/2014)   History of blood transfusion ?2015   "cause I was throwing it up"   History of hiatal hernia    Hypercholesterolemia    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HCC)    On home oxygen therapy    "2L ususally at night time" (09/08/2014)   Pulmonary embolism ([place])    "when I had my gallbladder taken out"   Type II diabetes mellitus ([place])     Patient Active  Problem List   Diagnosis Date Noted   Acute esophagitis 08/19/2018   Heme positive stool 08/19/2018   Melena 08/19/2018   Leukocytosis 08/19/2018   Sepsis due to undetermined organism ([place]) 08/18/2018   Hypokalemia 08/18/2018   Hypomagnesemia 08/18/2018   AKI (acute kidney injury) ([place]) 08/18/2018   Anemia 08/18/2018   Prolonged QT interval 08/18/2018   Hypercholesterolemia    GERD (gastroesophageal reflux disease)    Cellulitis in diabetic foot ([place]) 08/23/2016   COPD with chronic bronchitis ([place]) 08/23/2016   Tobacco abuse 08/23/2016   SOB (shortness of breath) 08/12/2016   COPD with acute exacerbation ([place]) 08/12/2016   Cellulitis and abscess of foot    Cellulitis 08/06/2016   Benign neoplasm of sigmoid colon    Rectal ulceration    GI bleed 09/07/2014   Colitis 09/07/2014   COPD (chronic obstructive pulmonary disease) ([place]) 09/07/2014   Type II diabetes mellitus ([place]) 09/07/2014   PAD (peripheral artery disease) ([place]) 09/07/2014   Urinary retention 09/07/2014    Past Surgical History:  Procedure Laterality Date   ABDOMINAL HYSTERECTOMY     BIOPSY  08/20/2018   Procedure: BIOPSY;  Surgeon: Rogene Houston, MD;  Location: [facility];  Service: Endoscopy;;   CARPAL TUNNEL RELEASE Bilateral    CATARACT EXTRACTION W/ INTRAOCULAR  LENS  IMPLANT, BILATERAL Bilateral    COLONOSCOPY N/A 09/09/2014   Procedure: COLONOSCOPY;  Surgeon: Irene Shipper, MD;  Location: [facility];  Service: Endoscopy;  Laterality: N/A;   ESOPHAGOGASTRODUODENOSCOPY N/A 08/20/2018   Procedure: ESOPHAGOGASTRODUODENOSCOPY (EGD);  Surgeon: Rogene Houston, MD;  Location: [facility];  Service: Endoscopy;  Laterality: N/A;   EXCISIONAL HEMORRHOIDECTOMY     INCISION AND DRAINAGE ABSCESS Right 08/09/2016   Procedure: INCISION AND DRAINAGE ABSCESS;  Surgeon: Aviva Signs, MD;  Location: [facility] ORS;  Service: General;  Laterality: Right;   LAPAROSCOPIC CHOLECYSTECTOMY     MELANOMA EXCISION     "off my nose"    TUBAL LIGATION       OB History   No obstetric history on file.     Family History  Problem Relation Age of Onset   Heart disease Mother    AAA (abdominal aortic aneurysm) Mother        Died of aneurysm   Diabetes Sister    Lung cancer Sister     Social History   Tobacco Use   Smoking status: Every Day    Packs/day: 1.00    Years: 32.00    Pack years: 32.00    Types: Cigarettes   Smokeless tobacco: Never  Substance Use Topics   Alcohol use: Not Currently    Comment: 09/08/2014 "I drank a little bit a long time ago"   Drug use: No    Home Medications Prior to Admission medications   Medication Sig Start Date End Date Taking? Authorizing Provider  albuterol (PROVENTIL HFA;VENTOLIN HFA) 108 (90 Base) MCG/ACT inhaler Inhale 1-2 puffs into the lungs every 6 (six) hours as needed for wheezing or shortness of breath.    [provider]  albuterol (PROVENTIL) (2.5 MG/3ML) 0.083% nebulizer solution Take 2.5 mg by nebulization every 6 (six) hours as needed for wheezing or shortness of breath.    [provider]  ALPRAZolam Duanne Moron) 0.5 MG tablet Take 0.5 mg by mouth 2 (two) times daily.     [provider]  amLODipine (NORVASC) 5 MG tablet Take 5 mg by mouth daily.  06/21/19   [provider]  apixaban (ELIQUIS) 5 MG TABS tablet Take 5 mg by mouth 2 (two) times daily.    [provider]  bismuth subsalicylate (PEPTO BISMOL) 262 MG/15ML suspension Take 30 mLs by mouth every 6 (six) hours as needed for indigestion or diarrhea or loose stools.    [provider]  DULoxetine (CYMBALTA) 30 MG capsule Take 60 mg by mouth daily.     [provider]  fluticasone (FLONASE) 50 MCG/ACT nasal spray Place 2 sprays into both nostrils daily as needed for allergies.     [provider]  Fluticasone-Salmeterol (ADVAIR) 500-50 MCG/DOSE AEPB Inhale 1 puff into the lungs daily.     [provider]  furosemide (LASIX) 40 MG  tablet Take 40 mg by mouth 2 (two) times daily.  08/15/19   [provider]  HYDROcodone-acetaminophen (NORCO/VICODIN) 5-325 MG tablet Take 1 tablet by mouth every 6 (six) hours as needed for moderate pain. Patient taking differently: Take 1 tablet by mouth in the morning, at noon, and at bedtime.  08/26/16   Arrien, Jimmy Picket, MD  insulin aspart (NOVOLOG) 100 UNIT/ML injection Inject 0-10 Units into the skin 3 (three) times daily with meals. For glucose 150 to 200 use 2 units, 201 to 250 use 4 units, 251-300 use 6 units, 301-350 use 8 units, 351  or greater 10 units. 08/26/16   Arrien, Jimmy Picket, MD  insulin glargine (LANTUS) 100 UNIT/ML injection Inject 0.2 mLs (20 Units total) into the skin at bedtime. Patient taking differently: Inject 30 Units into the skin at bedtime.  08/11/16   Orvan Falconer, MD  ketoconazole (NIZORAL) 2 % cream Apply 1 application topically daily as needed for irritation (APPLIED TO FEET).  07/27/16   [provider]  loperamide (IMODIUM A-D) 2 MG tablet Take 2 mg by mouth 4 (four) times daily as needed for diarrhea or loose stools.    [provider]  metoCLOPramide (REGLAN) 5 MG tablet TAKE 1 TABLET 4 TIMES A DAY, BEFORE MEALS AND AT BEDTIME. Patient not taking: Reported on 09/16/2019 08/30/19   Laurine Blazer B, PA-C  ondansetron (ZOFRAN) 4 MG tablet TAKE 1 TABLET BY MOUTH EVERY 8 HOURS AS NEEDED FOR NAUSEA & VOMITING. 11/25/19   Laurine Blazer B, PA-C  pantoprazole (PROTONIX) 40 MG tablet TAKE 1 TABLET ONCE DAILY. 09/14/20   Harvel Quale, MD  potassium chloride (MICRO-K) 10 MEQ CR capsule Take 10 mEq by mouth daily.  07/31/19   [provider]  rOPINIRole (REQUIP) 1 MG tablet Take 1 mg by mouth at bedtime.  08/02/19   [provider]  Vitamin D, Ergocalciferol, (DRISDOL) 1.25 MG (50000 UNIT) CAPS capsule Take 50,000 Units by mouth every 7 (seven) days. Saturdays    [provider]    Allergies    Adhesive  [tape]  Review of Systems   Review of Systems  Unable to perform ROS: Mental status change   Physical Exam Updated Vital Signs There were no vitals taken for this visit.  Physical Exam Constitutional:      Comments: Ill-appearing hypotensive severely weak but awake  HENT:     Head: Normocephalic and atraumatic.     Nose: Nose normal.     Mouth/Throat:     Mouth: Mucous membranes are dry.  Eyes:     Extraocular Movements: Extraocular movements intact.     Conjunctiva/sclera: Conjunctivae normal.     Pupils: Pupils are equal, round, and reactive to light.  Cardiovascular:     Rate and Rhythm: Tachycardia present.     Heart sounds: No murmur heard. Pulmonary:     Effort: Respiratory distress present.     Breath sounds: Wheezing, rhonchi and rales present.     Comments: Increased work of breathing, rales and wheezing diffusely Abdominal:     General: There is no distension.     Palpations: There is no mass.     Tenderness: There is no abdominal tenderness.  Musculoskeletal:     Cervical back: Normal range of motion.     Right lower leg: Edema present.     Left lower leg: Edema present.     Comments: Symmetrical minimal pitting edema  Skin:    General: Skin is warm and dry.     Findings: No rash.  Neurological:     Comments: Awake able to use her grips equal bilateral, able to move her legs minimally but bilaterally symmetrical.  No facial, attempts to speak but very very quiet, she is answering questions appropriately based on how she mouth the words    ED Results / Procedures / Treatments   Labs (all labs ordered are listed, but only abnormal results are displayed) Labs Reviewed  CULTURE, BLOOD (ROUTINE X 2)  CULTURE, BLOOD (ROUTINE X 2)  URINE CULTURE  LACTIC ACID, PLASMA  LACTIC ACID, PLASMA  COMPREHENSIVE  METABOLIC PANEL  CBC WITH DIFFERENTIAL/PLATELET  PROTIME-INR  APTT  URINALYSIS, ROUTINE W REFLEX MICROSCOPIC    EKG EKG  Interpretation  Date/Time:  Thursday November 26 2020 19:32:52 EDT Ventricular Rate:  114 PR Interval:    QRS Duration: 88 QT Interval:  321 QTC Calculation: 442 R Axis:   30 Text Interpretation: Junctional tachycardia Low voltage, extremity and precordial leads Confirmed by Noemi Chapel [phone]) on 11/19/2020 8:24:58 PM  Radiology DG Chest Port 1 View  Result Date: 11/19/2020 CLINICAL DATA:  Questionable sepsis - evaluate for abnormality EXAM: PORTABLE CHEST 1 VIEW COMPARISON:  10/29/2020 FINDINGS: Right upper extremity PICC has been removed. Improved hazy lung base opacities with persistent bilateral pleural effusion. Fluid tracks into the right minor fissure. Associated bibasilar opacities favor atelectasis, overall improved from prior. Upper normal heart size. Aortic atherosclerosis. No pneumothorax. IMPRESSION: Improved hazy lung base opacities with persistent bilateral pleural effusions and atelectasis. Overall improved aeration from prior imaging. Electronically Signed   By: Keith Rake M.D.   On: 11/16/2020 19:49    Procedures .Critical Care Performed by: Noemi Chapel, MD Authorized by: Noemi Chapel, MD   Critical care provider statement:    Critical care time (minutes):  35   Critical care time was exclusive of:  Separately billable procedures and treating other patients and teaching time   Critical care was necessary to treat or prevent imminent or life-threatening deterioration of the following conditions:  Sepsis and shock   Critical care was time spent personally by me on the following activities:  Blood draw for specimens, development of treatment plan with patient or surrogate, discussions with consultants, evaluation of patient's response to treatment, examination of patient, obtaining history from patient or surrogate, ordering and performing treatments and interventions, ordering and review of laboratory studies, ordering and review of radiographic studies, pulse  oximetry, re-evaluation of patient's condition and review of old charts Comments:       IO LINE INSERTION  Date/Time: 11/19/2020 11:48 PM Performed by: Noemi Chapel, MD Authorized by: Noemi Chapel, MD   Consent:    Consent obtained:  Verbal   Consent given by:  Patient   Risks, benefits, and alternatives were discussed: yes     Risks discussed:  Pain, infection, incorrect placement and bleeding   Alternatives discussed:  No treatment and delayed treatment Universal protocol:    Procedure explained and questions answered to patient or proxy's satisfaction: yes     Relevant documents present and verified: yes     Required blood products, implants, devices, and special equipment available: yes     Site/side marked: yes     Immediately prior to procedure, a time out was called: yes     Patient identity confirmed:  Verbally with patient Pre-procedure details:    Site preparation:  Chlorhexidine   Preparation: Patient was prepped and draped in usual sterile fashion   Procedure details:    Insertion site:  L proximal tibia   Insertion device:  Drill device   Insertion: Needle was inserted through the bony cortex     Number of attempts:  1   Insertion confirmation:  Aspiration of blood/marrow, easy infusion of fluids and stability of the needle Post-procedure details:    Secured with:  Transparent dressing   Procedure completion:  Tolerated well, no immediate complications Comments:         Medications Ordered in ED Medications  lactated ringers infusion (has no administration in time range)  lactated ringers bolus 1,000 mL (has no  administration in time range)    And  lactated ringers bolus 1,000 mL (has no administration in time range)    And  lactated ringers bolus 500 mL (has no administration in time range)  cefTRIAXone (ROCEPHIN) 2 g in sodium chloride 0.9 % 100 mL IVPB (has no administration in time range)  azithromycin (ZITHROMAX) 500 mg in sodium chloride 0.9 % 250  mL IVPB (has no administration in time range)    ED Course  I have reviewed the triage vital signs and the nursing notes.  Pertinent labs & imaging results that were available during my care of the patient were reviewed by me and considered in my medical decision making (see chart for details).  Clinical Course as of 11/25/2020 2346  Thu Nov 26, 2020  1937 Potassium level noted to be elevated, recheck, calcium will be given, creatinine is only 1.5, BUN of 38, severe leukocytosis of 44,000 and a lactate of 3.6.  Blood pressure is responding nicely to IV fluids [BM]    Clinical Course User Index [BM] Noemi Chapel, MD   MDM Rules/Calculators/A&P                           Ultimately this patient has risk factors for sepsis and septic shock including a blood pressure of 60/40, heart rate of 130, abnormal oxygenation and lung sounds.  I suspect she has pulmonary sepsis.  Sepsis order set used, cultures requested, antibiotics ordered, IV fluids ordered at 30 cc/kg of lactated Ringer's.  This patient is critically ill  The patient's blood pressure has responded, systolic is 99, heart rate is improved, still very ill-appearing but oxygenating well, urinalysis has a likely source of infection but due to significant diarrhea will obtain CT scan.  I discussed the case with Dr. Josephine Cables who is in agreement, this patient is critically ill with hyperkalemia and septic shock  A decision needed to be made about placing central access versus an intraosseous line, unfortunately the patient is severely kyphotic and both sides of her neck are inaccessible furthermore an ultrasound placed on her neck showed that there is no good access point for an internal jugular vein.  A subclavian route would be somewhat possible but due to the patient's small stature and the risk of her already compromised pulmonary function as well as the risk of putting a femoral line and given her copious amounts of diarrhea were both  potentially risky ventures.  Ultimately an intraosseous line was suggested to the patient and she agreed.  See attached note, she has 2 working IVs 1 in the left anterior tibia and 1 in her left hand.   Final Clinical Impression(s) / ED Diagnoses Final diagnoses:  Septic shock ([place])     Noemi Chapel, MD 11/25/2020 2221    Noemi Chapel, MD 11/15/2020 [phone]

## 2020-11-26 NOTE — ED Notes (Signed)
Provider made aware of Potassium

## 2020-11-26 NOTE — ED Notes (Signed)
Critical k+ 6.5 Dr. Sabra Heck notified

## 2020-11-26 NOTE — ED Notes (Signed)
Patient transported to CT 

## 2020-11-27 ENCOUNTER — Inpatient Hospital Stay: Payer: Self-pay

## 2020-11-27 ENCOUNTER — Inpatient Hospital Stay (HOSPITAL_COMMUNITY): Payer: 59

## 2020-11-27 ENCOUNTER — Encounter (HOSPITAL_COMMUNITY): Payer: Self-pay | Admitting: Internal Medicine

## 2020-11-27 DIAGNOSIS — G9341 Metabolic encephalopathy: Secondary | ICD-10-CM

## 2020-11-27 DIAGNOSIS — J9621 Acute and chronic respiratory failure with hypoxia: Secondary | ICD-10-CM

## 2020-11-27 DIAGNOSIS — E872 Acidosis, unspecified: Secondary | ICD-10-CM

## 2020-11-27 DIAGNOSIS — A419 Sepsis, unspecified organism: Secondary | ICD-10-CM | POA: Diagnosis not present

## 2020-11-27 DIAGNOSIS — N390 Urinary tract infection, site not specified: Secondary | ICD-10-CM | POA: Diagnosis not present

## 2020-11-27 DIAGNOSIS — E1165 Type 2 diabetes mellitus with hyperglycemia: Secondary | ICD-10-CM

## 2020-11-27 DIAGNOSIS — E8809 Other disorders of plasma-protein metabolism, not elsewhere classified: Secondary | ICD-10-CM

## 2020-11-27 DIAGNOSIS — R6521 Severe sepsis with septic shock: Secondary | ICD-10-CM

## 2020-11-27 DIAGNOSIS — E875 Hyperkalemia: Secondary | ICD-10-CM

## 2020-11-27 DIAGNOSIS — G2581 Restless legs syndrome: Secondary | ICD-10-CM

## 2020-11-27 DIAGNOSIS — Z86711 Personal history of pulmonary embolism: Secondary | ICD-10-CM

## 2020-11-27 DIAGNOSIS — D3502 Benign neoplasm of left adrenal gland: Secondary | ICD-10-CM | POA: Diagnosis not present

## 2020-11-27 DIAGNOSIS — D75839 Thrombocytosis, unspecified: Secondary | ICD-10-CM

## 2020-11-27 DIAGNOSIS — I472 Ventricular tachycardia: Secondary | ICD-10-CM

## 2020-11-27 DIAGNOSIS — T68XXXA Hypothermia, initial encounter: Secondary | ICD-10-CM

## 2020-11-27 LAB — BASIC METABOLIC PANEL
Anion gap: 8 (ref 5–15)
BUN: 33 mg/dL — ABNORMAL HIGH (ref 8–23)
CO2: 22 mmol/L (ref 22–32)
Calcium: 7.1 mg/dL — ABNORMAL LOW (ref 8.9–10.3)
Chloride: 101 mmol/L (ref 98–111)
Creatinine, Ser: 1.25 mg/dL — ABNORMAL HIGH (ref 0.44–1.00)
GFR, Estimated: 44 mL/min — ABNORMAL LOW (ref >60)
Glucose, Bld: 282 mg/dL — ABNORMAL HIGH (ref 70–99)
Potassium: 4 mmol/L (ref 3.5–5.1)
Sodium: 131 mmol/L — ABNORMAL LOW (ref 135–145)

## 2020-11-27 LAB — COMPREHENSIVE METABOLIC PANEL
ALT: 30 U/L (ref 0–44)
AST: 65 U/L — ABNORMAL HIGH (ref 15–41)
Albumin: 1.4 g/dL — ABNORMAL LOW (ref 3.5–5.0)
Alkaline Phosphatase: 140 U/L — ABNORMAL HIGH (ref 38–126)
Anion gap: 10 (ref 5–15)
BUN: 35 mg/dL — ABNORMAL HIGH (ref 8–23)
CO2: 23 mmol/L (ref 22–32)
Calcium: 7.8 mg/dL — ABNORMAL LOW (ref 8.9–10.3)
Chloride: 100 mmol/L (ref 98–111)
Creatinine, Ser: 1.41 mg/dL — ABNORMAL HIGH (ref 0.44–1.00)
GFR, Estimated: 38 mL/min — ABNORMAL LOW (ref >60)
Glucose, Bld: 90 mg/dL (ref 70–99)
Potassium: 5 mmol/L (ref 3.5–5.1)
Sodium: 133 mmol/L — ABNORMAL LOW (ref 135–145)
Total Bilirubin: 0.4 mg/dL (ref 0.3–1.2)
Total Protein: 4.7 g/dL — ABNORMAL LOW (ref 6.5–8.1)

## 2020-11-27 LAB — CBC
HCT: 36.7 % (ref 36.0–46.0)
Hemoglobin: 10.8 g/dL — ABNORMAL LOW (ref 12.0–15.0)
MCH: 23 pg — ABNORMAL LOW (ref 26.0–34.0)
MCHC: 29.4 g/dL — ABNORMAL LOW (ref 30.0–36.0)
MCV: 78.3 fL — ABNORMAL LOW (ref 80.0–100.0)
Platelets: 488 10*3/uL — ABNORMAL HIGH (ref 150–400)
RBC: 4.69 MIL/uL (ref 3.87–5.11)
RDW: 24.6 % — ABNORMAL HIGH (ref 11.5–15.5)
WBC: 61.8 10*3/uL (ref 4.0–10.5)
nRBC: 0 % (ref 0.0–0.2)

## 2020-11-27 LAB — C DIFFICILE QUICK SCREEN W PCR REFLEX
C Diff antigen: NEGATIVE
C Diff interpretation: NOT DETECTED
C Diff toxin: NEGATIVE

## 2020-11-27 LAB — LACTIC ACID, PLASMA
Lactic Acid, Venous: 4.3 mmol/L (ref 0.5–1.9)
Lactic Acid, Venous: 3.7 mmol/L (ref 0.5–1.9)
Lactic Acid, Venous: 1 mmol/L (ref 0.5–1.9)

## 2020-11-27 LAB — GLUCOSE, CAPILLARY
Glucose-Capillary: 104 mg/dL — ABNORMAL HIGH (ref 70–99)
Glucose-Capillary: 118 mg/dL — ABNORMAL HIGH (ref 70–99)
Glucose-Capillary: 131 mg/dL — ABNORMAL HIGH (ref 70–99)
Glucose-Capillary: 149 mg/dL — ABNORMAL HIGH (ref 70–99)
Glucose-Capillary: 158 mg/dL — ABNORMAL HIGH (ref 70–99)
Glucose-Capillary: 173 mg/dL — ABNORMAL HIGH (ref 70–99)

## 2020-11-27 LAB — RESP PANEL BY RT-PCR (FLU A&B, COVID) ARPGX2
Influenza A by PCR: NEGATIVE
Influenza B by PCR: NEGATIVE
SARS Coronavirus 2 by RT PCR: NEGATIVE

## 2020-11-27 LAB — IRON AND TIBC
Iron: 10 ug/dL — ABNORMAL LOW (ref 28–170)
Saturation Ratios: 8 % — ABNORMAL LOW (ref 10.4–31.8)
TIBC: 128 ug/dL — ABNORMAL LOW (ref 250–450)
UIBC: 118 ug/dL

## 2020-11-27 LAB — APTT: aPTT: 31 seconds (ref 24–36)

## 2020-11-27 LAB — FERRITIN: Ferritin: 22 ng/mL (ref 11–307)

## 2020-11-27 LAB — HEMOGLOBIN A1C
Hgb A1c MFr Bld: 4.7 % — ABNORMAL LOW (ref 4.8–5.6)
Mean Plasma Glucose: 88.19 mg/dL

## 2020-11-27 LAB — PROTIME-INR
INR: 1.1 (ref 0.8–1.2)
Prothrombin Time: 14.3 seconds (ref 11.4–15.2)

## 2020-11-27 LAB — BRAIN NATRIURETIC PEPTIDE: B Natriuretic Peptide: 205 pg/mL — ABNORMAL HIGH (ref 0.0–100.0)

## 2020-11-27 LAB — OCCULT BLOOD X 1 CARD TO LAB, STOOL: Fecal Occult Bld: NEGATIVE

## 2020-11-27 LAB — PROCALCITONIN: Procalcitonin: 4.17 ng/mL

## 2020-11-27 LAB — MAGNESIUM: Magnesium: 1.4 mg/dL — ABNORMAL LOW (ref 1.7–2.4)

## 2020-11-27 LAB — TSH: TSH: 0.543 u[IU]/mL (ref 0.350–4.500)

## 2020-11-27 LAB — MRSA NEXT GEN BY PCR, NASAL: MRSA by PCR Next Gen: DETECTED — AB

## 2020-11-27 LAB — PHOSPHORUS: Phosphorus: 3.3 mg/dL (ref 2.5–4.6)

## 2020-11-27 MED ORDER — SODIUM CHLORIDE 0.9% FLUSH: 10.0000 mL | INTRAVENOUS | Status: DC | PRN

## 2020-11-27 MED ORDER — LACTATED RINGERS IV BOLUS
500.0000 mL | Freq: Once | INTRAVENOUS | Status: AC
  Administered 2020-11-27: 500 mL via INTRAVENOUS

## 2020-11-27 MED ORDER — INSULIN ASPART 100 UNIT/ML IJ SOLN
0.0000 [IU] | INTRAMUSCULAR | Status: DC
  Administered 2020-11-27: 2 [IU] via SUBCUTANEOUS
  Administered 2020-11-27 (×2): 1 [IU] via SUBCUTANEOUS
  Administered 2020-11-28: 2 [IU] via SUBCUTANEOUS
  Administered 2020-11-29: 1 [IU] via SUBCUTANEOUS
  Administered 2020-11-29: 2 [IU] via SUBCUTANEOUS

## 2020-11-27 MED ORDER — VANCOMYCIN HCL 1500 MG/300ML IV SOLN
1500.0000 mg | Freq: Once | INTRAVENOUS | Status: AC
  Administered 2020-11-27: 1500 mg via INTRAVENOUS
  Filled 2020-11-27: qty 300

## 2020-11-27 MED ORDER — PROSOURCE PLUS PO LIQD
30.0000 mL | Freq: Three times a day (TID) | ORAL | Status: DC
  Administered 2020-11-27 – 2020-11-29 (×3): 30 mL via ORAL
  Filled 2020-11-27 (×5): qty 30

## 2020-11-27 MED ORDER — SODIUM CHLORIDE 0.9% FLUSH
10.0000 mL | Freq: Two times a day (BID) | INTRAVENOUS | Status: DC
  Administered 2020-11-27 – 2020-11-28 (×4): 10 mL
  Administered 2020-11-29: 30 mL
  Administered 2020-11-29 – 2020-11-30 (×2): 10 mL

## 2020-11-27 MED ORDER — SENNOSIDES-DOCUSATE SODIUM 8.6-50 MG PO TABS: 1.0000 | ORAL_TABLET | Freq: Every evening | ORAL | Status: DC | PRN

## 2020-11-27 MED ORDER — SODIUM CHLORIDE 0.9 % IV SOLN
2.0000 g | INTRAVENOUS | Status: DC
  Administered 2020-11-28: 2 g via INTRAVENOUS
  Filled 2020-11-27: qty 2

## 2020-11-27 MED ORDER — HEPARIN SODIUM (PORCINE) 5000 UNIT/ML IJ SOLN
5000.0000 [IU] | Freq: Three times a day (TID) | INTRAMUSCULAR | Status: DC
  Administered 2020-11-27 – 2020-11-30 (×10): 5000 [IU] via SUBCUTANEOUS
  Filled 2020-11-27 (×10): qty 1

## 2020-11-27 MED ORDER — NICOTINE 21 MG/24HR TD PT24
21.0000 mg | MEDICATED_PATCH | Freq: Every day | TRANSDERMAL | Status: DC
  Administered 2020-11-27 – 2020-11-30 (×4): 21 mg via TRANSDERMAL
  Filled 2020-11-27 (×4): qty 1

## 2020-11-27 MED ORDER — ENSURE ENLIVE PO LIQD
237.0000 mL | Freq: Three times a day (TID) | ORAL | Status: DC
  Administered 2020-11-27 – 2020-11-28 (×2): 237 mL via ORAL

## 2020-11-27 MED ORDER — APIXABAN 5 MG PO TABS: 5.0000 mg | ORAL_TABLET | Freq: Two times a day (BID) | ORAL | Status: DC

## 2020-11-27 MED ORDER — SODIUM CHLORIDE 0.9 % IV SOLN: INTRAVENOUS | Status: DC

## 2020-11-27 MED ORDER — IPRATROPIUM-ALBUTEROL 0.5-2.5 (3) MG/3ML IN SOLN
3.0000 mL | RESPIRATORY_TRACT | Status: DC | PRN
  Administered 2020-11-29: 3 mL via RESPIRATORY_TRACT
  Filled 2020-11-27: qty 3

## 2020-11-27 MED ORDER — ALBUMIN HUMAN 25 % IV SOLN
25.0000 g | Freq: Four times a day (QID) | INTRAVENOUS | Status: AC
Start: 2020-11-27 — End: 2020-11-28
  Administered 2020-11-27 – 2020-11-28 (×5): 25 g via INTRAVENOUS
  Filled 2020-11-27 (×5): qty 100

## 2020-11-27 MED ORDER — CHLORHEXIDINE GLUCONATE CLOTH 2 % EX PADS
6.0000 | MEDICATED_PAD | Freq: Every day | CUTANEOUS | Status: DC
  Administered 2020-11-27 – 2020-11-30 (×4): 6 via TOPICAL

## 2020-11-27 MED ORDER — ACETAMINOPHEN 325 MG PO TABS
650.0000 mg | ORAL_TABLET | Freq: Four times a day (QID) | ORAL | Status: DC | PRN
  Administered 2020-11-27 – 2020-11-28 (×2): 650 mg via ORAL
  Filled 2020-11-27 (×2): qty 2

## 2020-11-27 MED ORDER — PANTOPRAZOLE SODIUM 40 MG IV SOLR
40.0000 mg | INTRAVENOUS | Status: DC
  Administered 2020-11-27 – 2020-11-30 (×4): 40 mg via INTRAVENOUS
  Filled 2020-11-27 (×4): qty 40

## 2020-11-27 MED ORDER — SODIUM CHLORIDE 0.9 % IV SOLN
250.0000 mg | Freq: Every day | INTRAVENOUS | Status: DC
  Administered 2020-11-27 – 2020-11-29 (×3): 250 mg via INTRAVENOUS
  Filled 2020-11-27 (×4): qty 20

## 2020-11-27 MED ORDER — MAGNESIUM SULFATE 2 GM/50ML IV SOLN
2.0000 g | Freq: Once | INTRAVENOUS | Status: AC
  Administered 2020-11-27: 2 g via INTRAVENOUS
  Filled 2020-11-27: qty 50

## 2020-11-27 MED ORDER — DEXTROSE-NACL 5-0.9 % IV SOLN: INTRAVENOUS | Status: DC

## 2020-11-27 MED ORDER — PHENYLEPHRINE HCL-NACL 20-0.9 MG/250ML-% IV SOLN
0.0000 ug/min | INTRAVENOUS | Status: DC
  Administered 2020-11-27: 120 ug/min via INTRAVENOUS
  Administered 2020-11-27: 210 ug/min via INTRAVENOUS
  Administered 2020-11-27: 20 ug/min via INTRAVENOUS
  Administered 2020-11-27: 210 ug/min via INTRAVENOUS
  Administered 2020-11-27: 130 ug/min via INTRAVENOUS
  Administered 2020-11-28: 210 ug/min via INTRAVENOUS
  Administered 2020-11-28: 120 ug/min via INTRAVENOUS
  Administered 2020-11-28: 230 ug/min via INTRAVENOUS
  Administered 2020-11-28: 190 ug/min via INTRAVENOUS
  Administered 2020-11-28: 250 ug/min via INTRAVENOUS
  Administered 2020-11-28 (×2): 200 ug/min via INTRAVENOUS
  Administered 2020-11-28: 250 ug/min via INTRAVENOUS
  Administered 2020-11-28: 210 ug/min via INTRAVENOUS
  Administered 2020-11-28: 100 ug/min via INTRAVENOUS
  Administered 2020-11-28: 250 ug/min via INTRAVENOUS
  Administered 2020-11-29: 90 ug/min via INTRAVENOUS
  Administered 2020-11-29: 60 ug/min via INTRAVENOUS
  Administered 2020-11-29 (×2): 90 ug/min via INTRAVENOUS
  Administered 2020-11-29: 100 ug/min via INTRAVENOUS
  Administered 2020-11-29: 90 ug/min via INTRAVENOUS
  Administered 2020-11-30: 210 ug/min via INTRAVENOUS
  Administered 2020-11-30: 150 ug/min via INTRAVENOUS
  Administered 2020-11-30: 160 ug/min via INTRAVENOUS
  Administered 2020-11-30: 140 ug/min via INTRAVENOUS
  Administered 2020-11-30: 280 ug/min via INTRAVENOUS
  Administered 2020-11-30: 290 ug/min via INTRAVENOUS
  Filled 2020-11-27 (×8): qty 250
  Filled 2020-11-27: qty 500
  Filled 2020-11-27 (×13): qty 250
  Filled 2020-11-27 (×2): qty 500
  Filled 2020-11-27: qty 250

## 2020-11-27 MED ORDER — ORAL CARE MOUTH RINSE
15.0000 mL | Freq: Two times a day (BID) | OROMUCOSAL | Status: DC
  Administered 2020-11-27 – 2020-11-30 (×5): 15 mL via OROMUCOSAL

## 2020-11-27 MED ORDER — VANCOMYCIN HCL IN DEXTROSE 1-5 GM/200ML-% IV SOLN
1000.0000 mg | INTRAVENOUS | Status: DC
  Administered 2020-11-29: 1000 mg via INTRAVENOUS
  Filled 2020-11-27: qty 200

## 2020-11-27 MED ORDER — FUROSEMIDE 10 MG/ML IJ SOLN
20.0000 mg | Freq: Once | INTRAMUSCULAR | Status: AC
  Administered 2020-11-27: 20 mg via INTRAVENOUS
  Filled 2020-11-27: qty 2

## 2020-11-27 MED ORDER — LACTATED RINGERS IV BOLUS: 500.0000 mL | Freq: Once | INTRAVENOUS | Status: DC

## 2020-11-27 MED ORDER — HEPARIN SODIUM (PORCINE) 5000 UNIT/ML IJ SOLN
5000.0000 [IU] | Freq: Three times a day (TID) | INTRAMUSCULAR | Status: DC
  Administered 2020-11-27: 5000 [IU] via SUBCUTANEOUS
  Filled 2020-11-27: qty 1

## 2020-11-27 MED ORDER — ROPINIROLE HCL 1 MG PO TABS
1.0000 mg | ORAL_TABLET | Freq: Every day | ORAL | Status: DC
  Administered 2020-11-27: 1 mg via ORAL
  Filled 2020-11-27 (×5): qty 1

## 2020-11-27 MED ORDER — PIPERACILLIN-TAZOBACTAM 3.375 G IVPB: 3.3750 g | Freq: Three times a day (TID) | INTRAVENOUS | Status: DC

## 2020-11-27 MED ORDER — SODIUM CHLORIDE 0.9 % IV SOLN
2.0000 g | Freq: Once | INTRAVENOUS | Status: AC
  Administered 2020-11-27: 2 g via INTRAVENOUS
  Filled 2020-11-27: qty 2

## 2020-11-27 NOTE — Plan of Care (Signed)

## 2020-11-27 NOTE — Progress Notes (Signed)
Initial Nutrition Assessment  DOCUMENTATION CODES:      INTERVENTION:  Ensure Enlive po TID, each supplement provides 350 kcal and 20 grams of protein   ProSource Plus 30 ml TID (each 30 ml provides 100 kcal, 15 gr protein)  Assist with feeding as needed all meals  If pt unable to increase oral intake recommend place NGT and provide enteral nutrition.  Recommend:  Initiate Vital High Protein @ 40 ml/hr via NGT and increase by 10 ml every 8 hours to goal rate of 60 ml/hr.   Tube feeding regimen provides 1440 kcal, 125 grams of protein, and 1204 ml of H2O.     NUTRITION DIAGNOSIS:   Increased nutrient needs related to acute illness (sepsis UTI, unstagable PI x2) as evidenced by estimated needs.   GOAL:  Provide needs based on ASPEN/SCCM guidelines   MONITOR:  PO intake, Supplement acceptance, Labs, Weight trends, I & O's, Skin  REASON FOR ASSESSMENT:   Malnutrition Screening Tool    ASSESSMENT: Patient is a 80 yo female with hx of COPD, Gastric ulcer, CHF, GERD, DM2, fibromyalgia and depression. She presents with sepsis (UTI),  AKI, anemia (iron deficiency), PE, severe leukocytosis.   9/23 PICC placed.  Daughter was bedside and provided history with patient's permission. Patient [facility] to [facility]. She has not been eating very much the the past week. Primarily jello, soup with saltines and bites of other soft foods. She drinks Boost at home sometimes and likes ice cream.   Medications reviewed and include: Nicoderm patch, Protonix, Novolog.  Drips: albumin 25 gr x 5 doses.  Reviewed weight readings. Currently 69.2 kg. Patient has pitting edema upper and lower extremities which is likely masking actual weight. Expect acute weight loss given her limited oral intake the past week.   Intake/Output Summary (Last 24 hours) at 11/27/2020 1522 Last data filed at 11/27/2020 1504 Gross per 24 hour  Intake 2100.97 ml  Output 450 ml  Net 1650.97 ml      Labs: BMP Latest Ref Rng & Units 11/27/2020 11/22/2020 11/21/2020  Glucose 70 - 99 mg/dL 90 164(H) 178(H)  BUN 8 - 23 mg/dL 35(H) 35(H) 38(H)  Creatinine 0.44 - 1.00 mg/dL 1.41(H) 1.41(H) 1.53(H)  Sodium 135 - 145 mmol/L 133(L) 131(L) 130(L)  Potassium 3.5 - 5.1 mmol/L 5.0 6.5(HH) 6.5(HH)  Chloride 98 - 111 mmol/L 100 103 97(L)  CO2 22 - 32 mmol/L 23 21(L) 23  Calcium 8.9 - 10.3 mg/dL 7.8(L) 7.4(L) 7.8(L)      NUTRITION - FOCUSED PHYSICAL EXAM: Unable to complete Nutrition-Focused physical exam at this time.     Diet Order:   Diet Order             Diet NPO time specified  Diet effective now                   EDUCATION NEEDS:  Not appropriate for education at this time  Skin:  Skin Assessment: Skin Integrity Issues: Skin Integrity Issues:: Unstageable Unstageable: x 2 lumbar area  Last BM:  9/23  Height:   Ht Readings from Last 1 Encounters:  11/27/20 5' (1.524 m)    Weight:   Wt Readings from Last 1 Encounters:  11/27/20 69.2 kg    Ideal Body Weight:   45 kg  BMI:  Body mass index is 29.79 kg/m.  Estimated Nutritional Needs:   Kcal:  1300-1500  Protein:  115-124 gr  Fluid:  1600 ml daily   Colman Cater MS,RD,CSG,LDN  Contact: [facility]

## 2020-11-27 NOTE — Progress Notes (Signed)
Spoke with ICU RN to verify [facility] RN would be there to place PICC. Dr. Reesa Chew telephoned and informed that Kathlee Nations, RN at [facility] would place PICC.

## 2020-11-27 NOTE — Care Management Important Message (Deleted)
Important Message  Patient Details  Name: Jacqueline Hansen MRN: [id number] Date of Birth: December 23, 1940   Medicare Important Message Given:  Yes     Tommy Medal 11/27/2020, 4:33 PM

## 2020-11-27 NOTE — Progress Notes (Signed)
Peripherally Inserted Central Catheter Placement  The IV Nurse has discussed with the patient and/or persons authorized to consent for the patient, the purpose of this procedure and the potential benefits and risks involved with this procedure.  The benefits include less needle sticks, lab draws from the catheter, and the patient may be discharged home with the catheter. Risks include, but not limited to, infection, bleeding, blood clot (thrombus formation), and puncture of an artery; nerve damage and irregular heartbeat and possibility to perform a PICC exchange if needed/ordered by physician.  Alternatives to this procedure were also discussed.  Bard Power PICC patient education guide, fact sheet on infection prevention and patient information card has been provided to patient /or left at bedside. Consent obtained at bedside from daughter Tempie Donning).   PICC Placement Documentation  PICC Triple Lumen 11/27/20 PICC Right Brachial 35 cm 0 cm (Active)  Indication for Insertion or Continuance of Line Vasoactive infusions;Limited venous access - need for IV therapy >5 days (PICC only) 11/27/20 1100  Exposed Catheter (cm) 0 cm 11/27/20 1100  Site Assessment Clean;Dry;Intact 11/27/20 1100  Lumen #1 Status Flushed;Saline locked;Blood return noted 11/27/20 1100  Lumen #2 Status Flushed;Saline locked;Blood return noted 11/27/20 1100  Lumen #3 Status Flushed;Saline locked;Blood return noted 11/27/20 1100  Dressing Type Transparent;Securing device 11/27/20 1100  Dressing Status Clean;Dry;Intact 11/27/20 1100  Antimicrobial disc in place? Yes 11/27/20 1100  Safety Lock Not Applicable 00/76/22 6333  Line Care Connections checked and tightened 11/27/20 1100  Dressing Intervention New dressing 11/27/20 1100  Dressing Change Due 12/04/20 11/27/20 [place] 11/27/2020, 11:04 AM

## 2020-11-27 NOTE — Progress Notes (Signed)
Patient given sips of water and tolerated well with NO coughing or clearing of throat noted. Patient was unable to use a straw and needed help with drinking. Dr Reesa Chew aware.

## 2020-11-27 NOTE — Progress Notes (Signed)
Palliative Medicine RN Note: Consult order noted for Goals of Care. PMT provider is scheduled to return to [facility] on Monday 9/26. If [name] remains admitted at that time, we will evaluate her case then.  If recommendations are needed in the interim, please call our office at [phone].  Jacqueline Hansen Kammy Klett, RN, BSN, [facility] Palliative Medicine Team 11/27/2020 9:19 AM Office [phone]

## 2020-11-27 NOTE — Progress Notes (Signed)
Patient was noted to be tachycardic, alert with eyes open, only nodding head yes or no to questions. Patient skin is weeping on arms and legs. Patient currently has an IO for IV. 22 gauge placed in left forearm. Lab having difficulty getting blood sticks also. Dr Reesa Chew was paged and made aware of all of this and up to room to assess.

## 2020-11-27 NOTE — Progress Notes (Addendum)
PROGRESS NOTE    Jacqueline Hansen  JJK:[id number] DOB: September 26, 1940 DOA: 11/29/2020 PCP: Alanson Puls [facility]   Brief Narrative:  80 year old female with history of anxiety, depression, osteoarthritis, diastolic CHF, asthma, recurrent episode of bronchitis and pneumonia, COPD, gastric ulcer, iron deficiency anemia, seizure disorder, DM2, history of PE, hyperlipidemia brought to the hospital for generalized weakness and low blood pressure.  Patient is a very poor historian.  She was admitted with diagnosis of septic shock secondary to urinary tract infection.  She was started on pressors, IV fluids.  Assessment & Plan:   Principal Problem:   UTI (urinary tract infection) Active Problems:   COPD (chronic obstructive pulmonary disease) (HCC)   Sepsis due to undetermined organism (HCC)   GERD (gastroesophageal reflux disease)   AKI (acute kidney injury) (HCC)   Microcytic anemia   Leukocytosis   Acute metabolic encephalopathy   Lactic acidosis   Thrombocytosis   Hypoalbuminemia due to protein-calorie malnutrition (HCC)   Hyperglycemia due to diabetes mellitus (HCC)   Adrenal adenoma, left   Acute on chronic respiratory failure with hypoxia (HCC)   Hyperkalemia   Hypothermia   Restless leg syndrome   History of pulmonary embolism   Septic shock secondary to urinary tract infection - Leukocytosis, tachycardia, hypotension.  Maintain ICU stay.  Currently has IO access, PICC line to be placed today.  Change LR to normal saline due to lactic acidosis.  Follow-up culture data.  On IV vancomycin and cefepime.  Albumin IV for 6 doses.  Check procalcitonin, BNP, TSH.  Accu-Cheks every 4 hours.  Continue Levophed to maintain MAP greater than 65 COVID/flu- negative She does have superficial wounds in her lower back and her mid back but does not appear to be infected.  Addendum 340pm: Transition Levophed to Neo to help with tachycardia. Lactic acidosis has resolved.   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  CT head not performed on admission but currently unstable to move the patient.  Grossly she is following all the commands and moving all 4 extremities except her left lower extremity.  Continue to monitor her mental status, will obtain further imaging if necessary.  Suspect her current mental status secondary to underlying severe infection.  Severe iron deficiency anemia History of large gastric ulcer - IV iron ordered.  Hemoccult is negative.  Had C-scope and endoscopy about 4 weeks ago which was negative at an outside hospital.  IV PPI daily  Acute kidney injury - Baseline creatinine 0.7, admission creatinine 1.4.  Suspect prerenal.  Getting IV fluids, minimal urine output.  Advised nursing staff to place Foley for accurate input and output.  Acute Respiratory distress with Hypoxia, b/l Pleural Effusion Recently had PNA? But also has pleural effusion. Cont on going Abx, Difficult diurese given hypotension.  Currently she is on 5 L nasal cannula, daughter confirms that patient needs to remain full code.  Hyperkalemia - Treated in the ED with medication, this morning 5.0.  Hypocalcemia - Received 1 dose of IV calcium gluconate.  Continue to monitor, give more as necessary.  Check ionized calcium again tomorrow morning  Hypomagnesemia - Repletion ordered.  Check phosphorus levels  Severe leukocytosis - WBC is as high as 61 previously it was normal in 7 range.  Suspect from underlying infection and ongoing physical stress.  Continue to monitor this.  She does not have any diarrhea at the moment but her C. difficile checked during  this admission is negative.  History of previous CVA - Has chronic left-sided weakness especially lower extremities.  Per record she has a history of paroxysmal A. fib and PE - Recently she was taken off her anticoagulation.  We will monitor her  off of that for now.  Restless leg syndrome - On ropinirole if she can tolerate orals  DVT prophylaxis: SQ Heparin Code Status: Full Family Communication: Spoke with the patient's daughter  Status is: Inpatient  Remains inpatient appropriate because:Hemodynamically unstable.  Patient is quite ill in the ICU hypotensive on pressors requiring broad-spectrum antibiotics.  Dispo: The patient is from: Group home              Anticipated d/c is to: Home              Patient currently is not medically stable to d/c.   Difficult to place patient No     Body mass index is 29.79 kg/m.  Pressure Injury 11/27/20 Vertebral column Lower;Right Unstageable - Full thickness tissue loss in which the base of the injury is covered by slough (yellow, tan, gray, green or brown) and/or eschar (tan, brown or black) in the wound bed. (Active)  11/27/20 0400  Location: Vertebral column  Location Orientation: Lower;Right  Staging: Unstageable - Full thickness tissue loss in which the base of the injury is covered by slough (yellow, tan, gray, green or brown) and/or eschar (tan, brown or black) in the wound bed.  Wound Description (Comments):   Present on Admission: Yes     Pressure Injury 11/27/20 Lumbar Right Unstageable - Full thickness tissue loss in which the base of the injury is covered by slough (yellow, tan, gray, green or brown) and/or eschar (tan, brown or black) in the wound bed. (Active)  11/27/20 0400  Location: Lumbar  Location Orientation: Right  Staging: Unstageable - Full thickness tissue loss in which the base of the injury is covered by slough (yellow, tan, gray, green or brown) and/or eschar (tan, brown or black) in the wound bed.  Wound Description (Comments):   Present on Admission: Yes       Subjective: Patient seen and examined at bedside, she does response to basic questions with yes or no but does not carry on a full conversation.  She is able to follow some commands when  asked to move her arms and legs.  Overall she is a poor historian.  I spoke with the patient's daughter over the phone who tells me patient has recently decline in her health over the last few months and has required several episodes of pneumonia and has been dealing with off-and-on GI issues.  She has undergone endoscopy and colonoscopy about a month ago which was overall negative.  She also states her mother has progressively become weaker with poor oral intake.  She wants her mother to remain full code and aggressive medical care.  Review of Systems Otherwise negative except as per HPI, including: General: Denies fever, chills, night sweats or unintended weight loss. Resp: Denies cough, wheezing, shortness of breath. Cardiac: Denies chest pain, palpitations, orthopnea, paroxysmal nocturnal dyspnea. GI: Denies abdominal pain, nausea, vomiting, diarrhea or constipation GU: Denies dysuria, frequency, hesitancy or incontinence MS: Denies muscle aches, joint pain or swelling Neuro: Denies headache, neurologic deficits (focal weakness, numbness, tingling), abnormal gait Psych: Denies anxiety, depression, SI/HI/AVH Skin: Denies new rashes or lesions ID: Denies sick contacts, exotic exposures, travel  Examination:  Constitutional: Patient appears ill Respiratory: Diminished sounds bilateral at the bases  Cardiovascular: Normal sinus rhythm, no rubs Abdomen: Nontender nondistended good bowel sounds Musculoskeletal: No edema noted Skin: Superficial ulcer without any evidence of infection in the lower back: On getting continued its capital ORIF ESI #acute Neurologic: Grossly moving all extremities except left lower extremity which according to daughter was affected after her previous CVA. Psychiatric: Alert to name and place.  Objective: Vitals:   11/27/20 0337 11/27/20 0648 11/27/20 0700 11/27/20 0747  BP:   110/78   Pulse: (!) 121  (!) 133   Resp: 20  (!) 21   Temp:    98.5 F (36.9 C)   TempSrc:    Axillary  SpO2: 97%  99%   Weight:  69.2 kg    Height:        Intake/Output Summary (Last 24 hours) at 11/27/2020 1034 Last data filed at 11/27/2020 0500 Gross per 24 hour  Intake 1131.08 ml  Output 200 ml  Net 931.08 ml   Filed Weights   11/27/20 0648  Weight: 69.2 kg     Data Reviewed:   CBC: Recent Labs  Lab 11/29/2020 1741 11/27/20 0502  WBC 44.3* 61.8*  NEUTROABS 41.0*  --   HGB 10.6* 10.8*  HCT 37.9 36.7  MCV 78.1* 78.3*  PLT 573* 829*   Basic Metabolic Panel: Recent Labs  Lab 11/19/2020 1741 11/25/2020 1947 11/27/20 0502  NA 130* 131* 133*  K 6.5* 6.5* 5.0  CL 97* 103 100  CO2 23 21* 23  GLUCOSE 178* 164* 90  BUN 38* 35* 35*  CREATININE 1.53* 1.41* 1.41*  CALCIUM 7.8* 7.4* 7.8*  MG  --   --  1.4*  PHOS  --   --  3.3   GFR: Estimated Creatinine Clearance: 28.1 mL/min (A) (by C-G formula based on SCr of 1.41 mg/dL (H)). Liver Function Tests: Recent Labs  Lab 11/13/2020 1741 11/27/20 0502  AST 21 65*  ALT 17 30  ALKPHOS 151* 140*  BILITOT 0.5 0.4  PROT 5.3* 4.7*  ALBUMIN 1.6* 1.4*   No results for input(s): LIPASE, AMYLASE in the last 168 hours. No results for input(s): AMMONIA in the last 168 hours. Coagulation Profile: Recent Labs  Lab 12/04/2020 1741 11/27/20 0502  INR 1.0 1.1   Cardiac Enzymes: No results for input(s): CKTOTAL, CKMB, CKMBINDEX, TROPONINI in the last 168 hours. BNP (last 3 results) No results for input(s): PROBNP in the last 8760 hours. HbA1C: Recent Labs    11/27/20 0502  HGBA1C 4.7*   CBG: Recent Labs  Lab 11/27/20 0435 11/27/20 0746  GLUCAP 104* 118*   Lipid Profile: No results for input(s): CHOL, HDL, LDLCALC, TRIG, CHOLHDL, LDLDIRECT in the last 72 hours. Thyroid Function Tests: Recent Labs    11/27/20 0502  TSH 0.543   Anemia Panel: Recent Labs    11/27/20 0502  FERRITIN 22  TIBC 128*  IRON 10*   Sepsis Labs: Recent Labs  Lab 12/04/2020 1741 11/25/2020 1929 11/27/20 0502  11/27/20 0803  PROCALCITON  --   --   --  4.17  LATICACIDVEN 3.6* 3.8* 4.3* 3.7*    Recent Results (from the past 240 hour(s))  Blood Culture (routine x 2)     Status: None (Preliminary result)   Collection Time: 11/28/2020  5:41 PM   Specimen: Right Antecubital; Blood  Result Value Ref Range Status   Specimen Description RIGHT ANTECUBITAL  Final   Special Requests   Final    Blood Culture results may not be optimal due to an excessive volume  of blood received in culture bottles BOTTLES DRAWN AEROBIC AND ANAEROBIC   Culture   Final    NO GROWTH < 12 HOURS Performed at [facility], [address] [place], [place] [address]    Report Status PENDING  Incomplete  Blood Culture (routine x 2)     Status: None (Preliminary result)   Collection Time: 11/11/2020  5:50 PM   Specimen: Left Antecubital; Blood  Result Value Ref Range Status   Specimen Description LEFT ANTECUBITAL  Final   Special Requests   Final    Blood Culture adequate volume BOTTLES DRAWN AEROBIC AND ANAEROBIC   Culture   Final    NO GROWTH < 12 HOURS Performed at [facility], [address] [place], [place] [address]    Report Status PENDING  Incomplete  C Difficile Quick Screen w PCR reflex     Status: None   Collection Time: 11/27/20 12:34 AM   Specimen: STOOL  Result Value Ref Range Status   C Diff antigen NEGATIVE NEGATIVE Final   C Diff toxin NEGATIVE NEGATIVE Final   C Diff interpretation No C. difficile detected.  Final    Comment: NEGATIVE Performed at [facility], [address], [place], [place] [address]   MRSA Next Gen by PCR, Nasal     Status: Abnormal   Collection Time: 11/27/20 12:36 AM   Specimen: Nasal Mucosa; Nasal Swab  Result Value Ref Range Status   MRSA by PCR Next Gen DETECTED (A) NOT DETECTED Final    Comment: RESULT CALLED TO, READ BACK BY AND VERIFIED WITH: WAGNOR,R@[id number]  BY MATTHEWS, B 9.23.22 (NOTE) The GeneXpert MRSA Assay (FDA approved for NASAL specimens only), is one component of  a comprehensive MRSA colonization surveillance program. It is not intended to diagnose MRSA infection nor to guide or monitor treatment for MRSA infections. Test performance is not FDA approved in patients less than 43 years old. Performed at [facility], [address], [place], [place] [address]   Resp Panel by RT-PCR (Flu A&B, Covid) Nasopharyngeal Swab     Status: None   Collection Time: 11/27/20  2:06 AM   Specimen: Nasopharyngeal Swab; Nasopharyngeal(NP) swabs in vial transport medium  Result Value Ref Range Status   SARS Coronavirus 2 by RT PCR NEGATIVE NEGATIVE Final    Comment: (NOTE) SARS-CoV-2 target nucleic acids are NOT DETECTED.  The SARS-CoV-2 RNA is generally detectable in upper respiratory specimens during the acute phase of infection. The lowest concentration of SARS-CoV-2 viral copies this assay can detect is 138 copies/mL. A negative result does not preclude SARS-Cov-2 infection and should not be used as the sole basis for treatment or other patient management decisions. A negative result may occur with  improper specimen collection/handling, submission of specimen other than nasopharyngeal swab, presence of viral mutation(s) within the areas targeted by this assay, and inadequate number of viral copies(<138 copies/mL). A negative result must be combined with clinical observations, patient history, and epidemiological information. The expected result is Negative.  Fact Sheet for Patients:  EntrepreneurPulse.com.au  Fact Sheet for Healthcare Providers:  IncredibleEmployment.be  This test is no t yet approved or cleared by the [place] FDA and  has been authorized for detection and/or diagnosis of SARS-CoV-2 by FDA under an Emergency Use Authorization (EUA). This EUA will remain  in effect (meaning this test can be used) for the duration of the COVID-19 declaration under Section 564(b)(1) of the Act, 21 U.S.C.section  360bbb-3(b)(1), unless the authorization is terminated  or revoked  sooner.       Influenza A by PCR NEGATIVE NEGATIVE Final   Influenza B by PCR NEGATIVE NEGATIVE Final    Comment: (NOTE) The Xpert Xpress SARS-CoV-2/FLU/RSV plus assay is intended as an aid in the diagnosis of influenza from Nasopharyngeal swab specimens and should not be used as a sole basis for treatment. Nasal washings and aspirates are unacceptable for Xpert Xpress SARS-CoV-2/FLU/RSV testing.  Fact Sheet for Patients: EntrepreneurPulse.com.au  Fact Sheet for Healthcare Providers: IncredibleEmployment.be  This test is not yet approved or cleared by the [place] FDA and has been authorized for detection and/or diagnosis of SARS-CoV-2 by FDA under an Emergency Use Authorization (EUA). This EUA will remain in effect (meaning this test can be used) for the duration of the COVID-19 declaration under Section 564(b)(1) of the Act, 21 U.S.C. section 360bbb-3(b)(1), unless the authorization is terminated or revoked.  Performed at [facility], [address], [place], [place] [address]          Radiology Studies: CT ABDOMEN PELVIS WO CONTRAST  Result Date: 11/27/2020 CLINICAL DATA:  Sepsis. EXAM: CT ABDOMEN AND PELVIS WITHOUT CONTRAST TECHNIQUE: Multidetector CT imaging of the abdomen and pelvis was performed following the standard protocol without IV contrast. COMPARISON:  CT abdomen pelvis dated 08/18/2018. FINDINGS: Evaluation of this exam is limited in the absence of intravenous contrast. Lower chest: Partially visualized small bilateral pleural effusions with near complete consolidative changes of the lower lobes which may represent atelectasis or infiltrate. There is coronary vascular calcification. No intra-abdominal free air or free fluid. Hepatobiliary: The liver is unremarkable. No intrahepatic biliary dilatation. Cholecystectomy. No retained calcified stone noted in  the central CBD. Pancreas: The pancreas is atrophic.  No active inflammatory changes. Spleen: Normal in size without focal abnormality. Adrenals/Urinary Tract: The right adrenal gland is unremarkable. 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 There is no hydronephrosis or nephrolithiasis on either side. Interval increase in the size of the right renal interpolar exophytic low attenuating lesion now measuring 2.3 cm. This is suboptimally characterized but demonstrates fluid attenuation, likely a cyst. The visualized ureters and urinary bladder appear unremarkable. Stomach/Bowel: There is no bowel obstruction or active inflammation. 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 The appendix is not visualized with certainty. No inflammatory changes identified in the right lower quadrant. Vascular/Lymphatic: Advanced aortoiliac atherosclerotic disease. The IVC is unremarkable. No portal venous gas. There is no adenopathy. Reproductive: Hysterectomy.  No adnexal masses. Other: Small fat containing umbilical hernia. Musculoskeletal: Osteopenia with degenerative changes of the spine. No acute osseous pathology. IMPRESSION: 1. No acute intra-abdominal or pelvic pathology. No bowel obstruction. 2. Partially visualized small bilateral pleural effusions with near complete consolidative changes of the lower lobes which may represent atelectasis or infiltrate. 3. Left adrenal adenoma. 4. Aortic Atherosclerosis (ICD10-I70.0). Electronically Signed   By: Anner Crete M.D.   On: 11/27/2020 00:10   DG Chest Port 1 View  Result Date: 11/09/2020 CLINICAL DATA:  Questionable sepsis - evaluate for abnormality EXAM: PORTABLE CHEST 1 VIEW COMPARISON:  10/29/2020 FINDINGS: Right upper extremity PICC has been removed. Improved hazy lung base opacities with persistent bilateral pleural effusion. Fluid tracks into the right minor fissure. Associated bibasilar opacities favor atelectasis, overall improved from prior. Upper  normal heart size. Aortic atherosclerosis. No pneumothorax. IMPRESSION: Improved hazy lung base opacities with persistent bilateral pleural effusions and atelectasis. Overall improved aeration from prior imaging. Electronically Signed   By: Keith Rake M.D.   On:  11/18/2020 19:49   [place] EKG SITE RITE  Result Date: 11/27/2020 If Site Rite image not attached, placement could not be confirmed due to current cardiac rhythm.       Scheduled Meds:  Chlorhexidine Gluconate Cloth  6 each Topical Q0600   insulin aspart  0-9 Units Subcutaneous Q4H   pantoprazole (PROTONIX) IV  40 mg Intravenous Q24H   rOPINIRole  1 mg Oral QHS   Continuous Infusions:  albumin human 25 g (11/27/20 0946)   [START ON 11/28/2020] ceFEPime (MAXIPIME) IV     dextrose 5 % and 0.9% NaCl     ferric gluconate (FERRLECIT) IVPB     norepinephrine (LEVOPHED) Adult infusion 3 mcg/min (11/27/20 0200)   [START ON 11/29/2020] vancomycin       LOS: 1 day    Critical care time spent : 45 minutes examining the patient, discussing with RN, consultants as needed, coordinating care and management.The medical decision making on this patient was of high complexity, the critically ill patient is at high risk for clinical deterioration.  Critical care time was exclusive of separately billable procedures and treating other patients. Critical care was necessary to treat or prevent imminent or life-threatening deterioration. Critical care was time spent personally by me on the following activities: development of treatment plan with patient and/or surrogate as well as nursing, discussions with consultants, evaluation of patient's response to treatment, examination of patient, obtaining history from patient or surrogate, ordering and performing treatments and interventions, ordering and review of laboratory studies, ordering and review of radiographic studies, pulse oximetry and re-evaluation of patient's condition.    Jillaine Waren Arsenio Loader,  MD [facility]  If 7PM-7AM, please contact night-coverage  11/27/2020, 10:34 AM

## 2020-11-27 NOTE — Progress Notes (Addendum)
Pharmacy Antibiotic Note  Jacqueline Hansen is a 80 y.o. female admitted on 11/13/2020 with sepsis.  Pharmacy has been consulted for vanc and cefepime dosing.   Plan: Vanc 1.5g IV x1  Vancomycin 1000 mg IV every 48 hours. Cefepime 2g IV every 24 hours. Monitor labs, c/s, and vanco level as indicated.  Height: 5' (152.4 cm) IBW/kg (Calculated) : 45.5  Temp (24hrs), Avg:97.8 F (36.6 C), Min:95.4 F (35.2 C), Max:99 F (37.2 C)  Recent Labs  Lab 11/21/2020 1741 11/09/2020 1929 12/03/2020 1947 11/27/20 0502  WBC 44.3*  --   --  61.8*  CREATININE 1.53*  --  1.41* 1.41*  LATICACIDVEN 3.6* 3.8*  --  4.3*    CrCl cannot be calculated (Unknown ideal weight.).    Allergies  Allergen Reactions   Adhesive [Tape] Other (See Comments)    Reaction:  Tears pts skin     Antimicrobials this admission: 9/22 ceftriaxone/azith x1 9/23 vanc>> 9/23 cefepime>>  Dose adjustments this admission:   Microbiology results: 9/22 blood>>ngtd MRSA+ C.diff neg 9/22 urine>> pending  Margot Ables, PharmD Clinical Pharmacist 11/27/2020 9:47 AM

## 2020-11-27 NOTE — Progress Notes (Signed)
RN called due to patient having 9 beats of V-Tach during which patient was momentarily unresponsive. At bedside, pt was awake and responsive though not talking and barely whisper. Patient has difficult breathing was difficult to obtain blood for analysis. Consult was placed for general surgery for central line

## 2020-11-28 DIAGNOSIS — N3000 Acute cystitis without hematuria: Secondary | ICD-10-CM

## 2020-11-28 DIAGNOSIS — N179 Acute kidney failure, unspecified: Secondary | ICD-10-CM | POA: Diagnosis not present

## 2020-11-28 DIAGNOSIS — G9341 Metabolic encephalopathy: Secondary | ICD-10-CM | POA: Diagnosis not present

## 2020-11-28 DIAGNOSIS — A419 Sepsis, unspecified organism: Secondary | ICD-10-CM | POA: Diagnosis not present

## 2020-11-28 LAB — COMPREHENSIVE METABOLIC PANEL
ALT: 18 U/L (ref 0–44)
AST: 24 U/L (ref 15–41)
Albumin: 3 g/dL — ABNORMAL LOW (ref 3.5–5.0)
Alkaline Phosphatase: 73 U/L (ref 38–126)
Anion gap: 9 (ref 5–15)
BUN: 30 mg/dL — ABNORMAL HIGH (ref 8–23)
CO2: 20 mmol/L — ABNORMAL LOW (ref 22–32)
Calcium: 7.3 mg/dL — ABNORMAL LOW (ref 8.9–10.3)
Chloride: 108 mmol/L (ref 98–111)
Creatinine, Ser: 1.09 mg/dL — ABNORMAL HIGH (ref 0.44–1.00)
GFR, Estimated: 52 mL/min — ABNORMAL LOW (ref >60)
Glucose, Bld: 112 mg/dL — ABNORMAL HIGH (ref 70–99)
Potassium: 2.8 mmol/L — ABNORMAL LOW (ref 3.5–5.1)
Sodium: 137 mmol/L (ref 135–145)
Total Bilirubin: 0.2 mg/dL — ABNORMAL LOW (ref 0.3–1.2)
Total Protein: 4.6 g/dL — ABNORMAL LOW (ref 6.5–8.1)

## 2020-11-28 LAB — VITAMIN B12: Vitamin B-12: 133 pg/mL — ABNORMAL LOW (ref 180–914)

## 2020-11-28 LAB — CBC
HCT: 21.9 % — ABNORMAL LOW (ref 36.0–46.0)
HCT: 23.2 % — ABNORMAL LOW (ref 36.0–46.0)
Hemoglobin: 6.3 g/dL — CL (ref 12.0–15.0)
Hemoglobin: 6.8 g/dL — CL (ref 12.0–15.0)
MCH: 22.8 pg — ABNORMAL LOW (ref 26.0–34.0)
MCH: 23.2 pg — ABNORMAL LOW (ref 26.0–34.0)
MCHC: 28.8 g/dL — ABNORMAL LOW (ref 30.0–36.0)
MCHC: 29.3 g/dL — ABNORMAL LOW (ref 30.0–36.0)
MCV: 79.3 fL — ABNORMAL LOW (ref 80.0–100.0)
MCV: 79.2 fL — ABNORMAL LOW (ref 80.0–100.0)
Platelets: 274 10*3/uL (ref 150–400)
Platelets: 303 10*3/uL (ref 150–400)
RBC: 2.76 MIL/uL — ABNORMAL LOW (ref 3.87–5.11)
RBC: 2.93 MIL/uL — ABNORMAL LOW (ref 3.87–5.11)
RDW: 24.5 % — ABNORMAL HIGH (ref 11.5–15.5)
RDW: 24.7 % — ABNORMAL HIGH (ref 11.5–15.5)
WBC: 30.3 10*3/uL — ABNORMAL HIGH (ref 4.0–10.5)
WBC: 31 10*3/uL — ABNORMAL HIGH (ref 4.0–10.5)
nRBC: 0 % (ref 0.0–0.2)
nRBC: 0 % (ref 0.0–0.2)

## 2020-11-28 LAB — BRAIN NATRIURETIC PEPTIDE: B Natriuretic Peptide: 1066 pg/mL — ABNORMAL HIGH (ref 0.0–100.0)

## 2020-11-28 LAB — GLUCOSE, CAPILLARY
Glucose-Capillary: 113 mg/dL — ABNORMAL HIGH (ref 70–99)
Glucose-Capillary: 123 mg/dL — ABNORMAL HIGH (ref 70–99)
Glucose-Capillary: 124 mg/dL — ABNORMAL HIGH (ref 70–99)
Glucose-Capillary: 152 mg/dL — ABNORMAL HIGH (ref 70–99)
Glucose-Capillary: 128 mg/dL — ABNORMAL HIGH (ref 70–99)

## 2020-11-28 LAB — URINE CULTURE

## 2020-11-28 LAB — HEMOGLOBIN AND HEMATOCRIT, BLOOD
HCT: 23 % — ABNORMAL LOW (ref 36.0–46.0)
Hemoglobin: 6.6 g/dL — CL (ref 12.0–15.0)

## 2020-11-28 LAB — FOLATE: Folate: 4.1 ng/mL — ABNORMAL LOW (ref >5.9)

## 2020-11-28 LAB — MAGNESIUM: Magnesium: 1.7 mg/dL (ref 1.7–2.4)

## 2020-11-28 MED ORDER — POTASSIUM CHLORIDE CRYS ER 20 MEQ PO TBCR
40.0000 meq | EXTENDED_RELEASE_TABLET | Freq: Once | ORAL | Status: AC
  Administered 2020-11-28: 40 meq via ORAL
  Filled 2020-11-28: qty 2

## 2020-11-28 MED ORDER — POTASSIUM CHLORIDE 10 MEQ/100ML IV SOLN
10.0000 meq | INTRAVENOUS | Status: AC
Start: 2020-11-28 — End: 2020-11-28
  Administered 2020-11-28 (×2): 10 meq via INTRAVENOUS
  Filled 2020-11-28 (×2): qty 100

## 2020-11-28 MED ORDER — MAGNESIUM SULFATE 2 GM/50ML IV SOLN
2.0000 g | Freq: Once | INTRAVENOUS | Status: AC
  Administered 2020-11-28: 2 g via INTRAVENOUS
  Filled 2020-11-28: qty 50

## 2020-11-28 MED ORDER — SODIUM CHLORIDE 0.9% IV SOLUTION: Freq: Once | INTRAVENOUS | Status: AC

## 2020-11-28 MED ORDER — SODIUM CHLORIDE 0.9 % IV SOLN
2.0000 g | Freq: Two times a day (BID) | INTRAVENOUS | Status: DC
  Administered 2020-11-28 – 2020-11-30 (×4): 2 g via INTRAVENOUS
  Filled 2020-11-28 (×4): qty 2

## 2020-11-28 NOTE — Progress Notes (Signed)
PROGRESS NOTE  Jacqueline Hansen CWC:[id number] DOB: [birth year] DOA: 11/18/2020 PCP: Alanson Puls [facility]  Brief History:  80 year old female with history of anxiety, depression, osteoarthritis, diastolic CHF, asthma, recurrent episode of bronchitis and pneumonia, COPD, gastric ulcer, iron deficiency anemia, seizure disorder, DM2, history of PE, hyperlipidemia brought to the hospital for generalized weakness and low blood pressure.  Patient is a very poor historian.  She was admitted with diagnosis of septic shock secondary to urinary tract infection.  She was started on pressors, IV fluids.  Assessment/Plan:  Septic shock secondary to urinary tract infection - Leukocytosis, tachycardia, hypotension.   -PICC line to be placed 9/23.   -due to pneumonia and UTI -Change LR to normal saline due to lactic acidosis.   Follow-up culture data.   -IV vancomycin and cefepime.  Albumin IV for 6 doses.   -Check procalcitonin, BNP, TSH.  Accu-Cheks every 4 hours.  Continue Levophed to maintain MAP greater than 65 COVID/flu- negative -She does have superficial wounds in her lower back and her mid back but does not appear to be infected.   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  CT head not performed on admission but currently unstable to move the patient.  Grossly she is following all the commands and moving all 4 extremities except her left lower extremity.   -Continue to monitor her mental status, will obtain further imaging if necessary.   -Suspect her current mental status secondary to underlying severe infection. -mental status overall improving   Severe iron deficiency anemia History of large gastric ulcer - IV iron ordered.  Hemoccult is negative.  Had C-scope and endoscopy about 4 weeks ago which was negative at an outside hospital.  IV PPI daily   Acute kidney injury - baseline creatinine  0.6-08 -serum creatinine peaked 1.53 -due to sepsis and volume depletion -improving with IVF   Acute Respiratory distress with Hypoxia, b/l Pleural Effusion Recently had PNA? But also has pleural effusion. Cont on going Abx, Difficult diurese given hypotension.   -initially on 5L [place] -now stable on RA -9/23 CT abd--new consolidation bilateral LL -PCT 4.17   Hyperkalemia - Treated in the ED with medication -now hypokalemic again>>replete   Hypocalcemia - Received 1 dose of IV calcium gluconate.  Continue to monitor, give more as necessary.  Check ionized calcium again tomorrow morning   Hypomagnesemia - Repletion ordered.     Severe leukocytosis - WBC is as high as 61 previously it was normal in 7 range.   -no diarrhea -no concerning WBC precursors -likely leukemoid reaction   History of previous CVA - Has chronic left-sided weakness especially lower extremities.   Per record she has a history of paroxysmal A. fib and PE - Recently she was taken off her anticoagulation.  We will monitor her off of that for now.   Restless leg syndrome - On ropinirole if she can tolerate orals     Status is: Inpatient  Remains inpatient appropriate because:Hemodynamically unstable  Dispo: The patient is from: Home              Anticipated d/c is to: Home              Patient currently is not medically stable to d/c.   Difficult to place patient No        Family Communication:   no Family at bedside  Consultants:  none  Code Status:  FULL   DVT Prophylaxis:  [place] Heparin    Procedures: As Listed in Progress Note Above  Antibiotics: Vanc 9/23>> Cefepime 9/23>>      Subjective:  Patient denies fevers, chills, headache, chest pain, dyspnea, nausea, vomiting, diarrhea, abdominal pain, dysuria, hematuria, hematochezia, and melena.  Objective: Vitals:   11/28/20 0630 11/28/20 0645 11/28/20 0700 11/28/20 0743  BP: 93/63 (!) 97/40 (!) 104/46   Pulse: (!) 102 97 100  99  Resp: 17 14 (!) 25 18  Temp:    (!) 97.5 F (36.4 C)  TempSrc:    Oral  SpO2: 99% 100% 94% 99%  Weight:      Height:        Intake/Output Summary (Last 24 hours) at 11/28/2020 6045 Last data filed at 11/28/2020 0800 Gross per 24 hour  Intake 4957.7 ml  Output 900 ml  Net 4057.7 ml   Weight change:  Exam:  General:  Pt is alert, follows commands appropriately, not in acute distress HEENT: No icterus, No thrush, No neck mass, [place]/AT Cardiovascular: RRR, S1/S2, no rubs, no gallops Respiratory: bilateral rales. No wheeze Abdomen: Soft/+BS, non tender, non distended, no guarding Extremities: 1 + LE edema, No lymphangitis, No petechiae, No rashes, no synovitis   Data Reviewed: I have personally reviewed following labs and imaging studies Basic Metabolic Panel: Recent Labs  Lab 11/18/2020 1741 11/14/2020 1947 11/27/20 0502 11/27/20 1500 11/28/20 0412  NA 130* 131* 133* 131* 137  K 6.5* 6.5* 5.0 4.0 2.8*  CL 97* 103 100 101 108  CO2 23 21* 23 22 20*  GLUCOSE 178* 164* 90 282* 112*  BUN 38* 35* 35* 33* 30*  CREATININE 1.53* 1.41* 1.41* 1.25* 1.09*  CALCIUM 7.8* 7.4* 7.8* 7.1* 7.3*  MG  --   --  1.4*  --  1.7  PHOS  --   --  3.3  --   --    Liver Function Tests: Recent Labs  Lab 11/08/2020 1741 11/27/20 0502 11/28/20 0412  AST 21 65* 24  ALT 17 30 18   ALKPHOS 151* 140* 73  BILITOT 0.5 0.4 0.2*  PROT 5.3* 4.7* 4.6*  ALBUMIN 1.6* 1.4* 3.0*   No results for input(s): LIPASE, AMYLASE in the last 168 hours. No results for input(s): AMMONIA in the last 168 hours. Coagulation Profile: Recent Labs  Lab 11/17/2020 1741 11/27/20 0502  INR 1.0 1.1   CBC: Recent Labs  Lab 11/22/2020 1741 11/27/20 0502 11/28/20 0412 11/28/20 0734  WBC 44.3* 61.8* 30.3*  --   NEUTROABS 41.0*  --   --   --   HGB 10.6* 10.8* 6.3* 6.6*  HCT 37.9 36.7 21.9* 23.0*  MCV 78.1* 78.3* 79.3*  --   PLT 573* 488* 274  --    Cardiac Enzymes: No results for input(s): CKTOTAL, CKMB, CKMBINDEX,  TROPONINI in the last 168 hours. BNP: Invalid input(s): POCBNP CBG: Recent Labs  Lab 11/27/20 1723 11/27/20 1932 11/27/20 2330 11/28/20 0348 11/28/20 0741  GLUCAP 173* 149* 158* 113* 123*   HbA1C: Recent Labs    11/27/20 0502  HGBA1C 4.7*   Urine analysis:    Component Value Date/Time   COLORURINE YELLOW 11/21/2020 2010   APPEARANCEUR CLOUDY (A) 11/15/2020 2010   LABSPEC 1.025 11/05/2020 2010   PHURINE 5.0 11/15/2020 2010   GLUCOSEU NEGATIVE 11/09/2020 2010   HGBUR NEGATIVE 11/24/2020 2010   BILIRUBINUR SMALL (A) 11/06/2020 2010   KETONESUR 5 (A) 11/12/2020 2010   PROTEINUR  30 (A) 11/12/2020 2010   NITRITE NEGATIVE 11/18/2020 2010   LEUKOCYTESUR MODERATE (A) 11/13/2020 2010   Sepsis Labs: @LABRCNTIP (procalcitonin:4,lacticidven:4) ) Recent Results (from the past 240 hour(s))  Blood Culture (routine x 2)     Status: None (Preliminary result)   Collection Time: 11/29/2020  5:41 PM   Specimen: Right Antecubital; Blood  Result Value Ref Range Status   Specimen Description RIGHT ANTECUBITAL  Final   Special Requests   Final    Blood Culture results may not be optimal due to an excessive volume of blood received in culture bottles BOTTLES DRAWN AEROBIC AND ANAEROBIC   Culture   Final    NO GROWTH < 12 HOURS Performed at [facility], [address] [place], [place] [address]    Report Status PENDING  Incomplete  Blood Culture (routine x 2)     Status: None (Preliminary result)   Collection Time: 11/21/2020  5:50 PM   Specimen: Left Antecubital; Blood  Result Value Ref Range Status   Specimen Description LEFT ANTECUBITAL  Final   Special Requests   Final    Blood Culture adequate volume BOTTLES DRAWN AEROBIC AND ANAEROBIC   Culture   Final    NO GROWTH < 12 HOURS Performed at [facility], [address], [place], [place] [address]    Report Status PENDING  Incomplete  C Difficile Quick Screen w PCR reflex     Status: None   Collection Time: 11/27/20 12:34 AM    Specimen: STOOL  Result Value Ref Range Status   C Diff antigen NEGATIVE NEGATIVE Final   C Diff toxin NEGATIVE NEGATIVE Final   C Diff interpretation No C. difficile detected.  Final    Comment: NEGATIVE Performed at [facility], [address], [place], [place] [address]   MRSA Next Gen by PCR, Nasal     Status: Abnormal   Collection Time: 11/27/20 12:36 AM   Specimen: Nasal Mucosa; Nasal Swab  Result Value Ref Range Status   MRSA by PCR Next Gen DETECTED (A) NOT DETECTED Final    Comment: RESULT CALLED TO, READ BACK BY AND VERIFIED WITH: WAGNOR,R@[id number]  BY MATTHEWS, B 9.23.22 (NOTE) The GeneXpert MRSA Assay (FDA approved for NASAL specimens only), is one component of a comprehensive MRSA colonization surveillance program. It is not intended to diagnose MRSA infection nor to guide or monitor treatment for MRSA infections. Test performance is not FDA approved in patients less than 79 years old. Performed at [facility], [address], [place],  [address]   Resp Panel by RT-PCR (Flu A&B, Covid) Nasopharyngeal Swab     Status: None   Collection Time: 11/27/20  2:06 AM   Specimen: Nasopharyngeal Swab; Nasopharyngeal(NP) swabs in vial transport medium  Result Value Ref Range Status   SARS Coronavirus 2 by RT PCR NEGATIVE NEGATIVE Final    Comment: (NOTE) SARS-CoV-2 target nucleic acids are NOT DETECTED.  The SARS-CoV-2 RNA is generally detectable in upper respiratory specimens during the acute phase of infection. The lowest concentration of SARS-CoV-2 viral copies this assay can detect is 138 copies/mL. A negative result does not preclude SARS-Cov-2 infection and should not be used as the sole basis for treatment or other patient management decisions. A negative result may occur with  improper specimen collection/handling, submission of specimen other than nasopharyngeal swab, presence of viral mutation(s) within the areas targeted by this assay, and inadequate number of  viral copies(<138 copies/mL). A negative result must be combined with clinical observations, patient history,  and epidemiological information. The expected result is Negative.  Fact Sheet for Patients:  EntrepreneurPulse.com.au  Fact Sheet for Healthcare Providers:  IncredibleEmployment.be  This test is no t yet approved or cleared by the [place] FDA and  has been authorized for detection and/or diagnosis of SARS-CoV-2 by FDA under an Emergency Use Authorization (EUA). This EUA will remain  in effect (meaning this test can be used) for the duration of the COVID-19 declaration under Section 564(b)(1) of the Act, 21 U.S.C.section 360bbb-3(b)(1), unless the authorization is terminated  or revoked sooner.       Influenza A by PCR NEGATIVE NEGATIVE Final   Influenza B by PCR NEGATIVE NEGATIVE Final    Comment: (NOTE) The Xpert Xpress SARS-CoV-2/FLU/RSV plus assay is intended as an aid in the diagnosis of influenza from Nasopharyngeal swab specimens and should not be used as a sole basis for treatment. Nasal washings and aspirates are unacceptable for Xpert Xpress SARS-CoV-2/FLU/RSV testing.  Fact Sheet for Patients: EntrepreneurPulse.com.au  Fact Sheet for Healthcare Providers: IncredibleEmployment.be  This test is not yet approved or cleared by the [place] FDA and has been authorized for detection and/or diagnosis of SARS-CoV-2 by FDA under an Emergency Use Authorization (EUA). This EUA will remain in effect (meaning this test can be used) for the duration of the COVID-19 declaration under Section 564(b)(1) of the Act, 21 U.S.C. section 360bbb-3(b)(1), unless the authorization is terminated or revoked.  Performed at [facility], [address], [place], [place] [address]      Scheduled Meds:  (feeding supplement) PROSource Plus  30 mL Oral TID BM   Chlorhexidine Gluconate Cloth  6 each  Topical Q0600   feeding supplement  237 mL Oral TID BM   heparin injection (subcutaneous)  5,000 Units Subcutaneous Q8H   insulin aspart  0-9 Units Subcutaneous Q4H   mouth rinse  15 mL Mouth Rinse BID   nicotine  21 mg Transdermal Daily   pantoprazole (PROTONIX) IV  40 mg Intravenous Q24H   rOPINIRole  1 mg Oral QHS   sodium chloride flush  10-40 mL Intracatheter Q12H   Continuous Infusions:  albumin human 25 g (11/28/20 0224)   ceFEPime (MAXIPIME) IV 2 g (11/28/20 0523)   dextrose 5 % and 0.9% NaCl 100 mL/hr at 11/28/20 0800   ferric gluconate (FERRLECIT) IVPB Stopped (11/27/20 1355)   phenylephrine (NEO-SYNEPHRINE) Adult infusion 230 mcg/min (11/28/20 0800)   potassium chloride 100 mL/hr at 11/28/20 0800   [START ON 11/29/2020] vancomycin      Procedures/Studies: CT ABDOMEN PELVIS WO CONTRAST  Result Date: 11/27/2020 CLINICAL DATA:  Sepsis. EXAM: CT ABDOMEN AND PELVIS WITHOUT CONTRAST TECHNIQUE: Multidetector CT imaging of the abdomen and pelvis was performed following the standard protocol without IV contrast. COMPARISON:  CT abdomen pelvis dated 08/18/2018. FINDINGS: Evaluation of this exam is limited in the absence of intravenous contrast. Lower chest: Partially visualized small bilateral pleural effusions with near complete consolidative changes of the lower lobes which may represent atelectasis or infiltrate. There is coronary vascular calcification. No intra-abdominal free air or free fluid. Hepatobiliary: The liver is unremarkable. No intrahepatic biliary dilatation. Cholecystectomy. No retained calcified stone noted in the central CBD. Pancreas: The pancreas is atrophic.  No active inflammatory changes. Spleen: Normal in size without focal abnormality. Adrenals/Urinary Tract: The right adrenal gland is unremarkable. 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 There is no hydronephrosis or nephrolithiasis on either side. Interval increase in the size of the right renal interpolar  exophytic  low attenuating lesion now measuring 2.3 cm. This is suboptimally characterized but demonstrates fluid attenuation, likely a cyst. The visualized ureters and urinary bladder appear unremarkable. Stomach/Bowel: There is no bowel obstruction or active inflammation. 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 The appendix is not visualized with certainty. No inflammatory changes identified in the right lower quadrant. Vascular/Lymphatic: Advanced aortoiliac atherosclerotic disease. The IVC is unremarkable. No portal venous gas. There is no adenopathy. Reproductive: Hysterectomy.  No adnexal masses. Other: Small fat containing umbilical hernia. Musculoskeletal: Osteopenia with degenerative changes of the spine. No acute osseous pathology. IMPRESSION: 1. No acute intra-abdominal or pelvic pathology. No bowel obstruction. 2. Partially visualized small bilateral pleural effusions with near complete consolidative changes of the lower lobes which may represent atelectasis or infiltrate. 3. Left adrenal adenoma. 4. Aortic Atherosclerosis (ICD10-I70.0). Electronically Signed   By: Anner Crete M.D.   On: 11/27/2020 00:10   DG Chest Port 1 View  Result Date: 11/27/2020 CLINICAL DATA:  Altered mental status. Dyspnea. History of congestive heart failure, asthma and COPD. EXAM: PORTABLE CHEST 1 VIEW COMPARISON:  Radiographs 11/14/2020 and 10/29/2020.  CT 09/26/2020. FINDINGS: 1423 hours. Right arm PICC projects to the level of the superior cavoatrial junction. The heart size and mediastinal contours are stable. There is aortic atherosclerosis. Small pleural effusions and mild bibasilar atelectasis are similar to the most recent prior study. There is no confluent airspace opacity, edema or pneumothorax. The bones appear unchanged. IMPRESSION: Similar appearance of the chest with small pleural effusions and mild bibasilar atelectasis. No new findings. Electronically Signed   By: Richardean Sale M.D.    On: 11/27/2020 14:59   DG Chest Port 1 View  Result Date: 11/29/2020 CLINICAL DATA:  Questionable sepsis - evaluate for abnormality EXAM: PORTABLE CHEST 1 VIEW COMPARISON:  10/29/2020 FINDINGS: Right upper extremity PICC has been removed. Improved hazy lung base opacities with persistent bilateral pleural effusion. Fluid tracks into the right minor fissure. Associated bibasilar opacities favor atelectasis, overall improved from prior. Upper normal heart size. Aortic atherosclerosis. No pneumothorax. IMPRESSION: Improved hazy lung base opacities with persistent bilateral pleural effusions and atelectasis. Overall improved aeration from prior imaging. Electronically Signed   By: Keith Rake M.D.   On: 11/27/2020 19:49   [place] EKG SITE RITE  Result Date: 11/27/2020 If Site Rite image not attached, placement could not be confirmed due to current cardiac rhythm.   Orson Eva, DO  [facility]  If 7PM-7AM, please contact night-coverage [email or web address] Password [facility] 11/28/2020, 8:33 AM   LOS: 2 days

## 2020-11-28 NOTE — Plan of Care (Signed)

## 2020-11-29 DIAGNOSIS — J9621 Acute and chronic respiratory failure with hypoxia: Secondary | ICD-10-CM | POA: Diagnosis not present

## 2020-11-29 DIAGNOSIS — J181 Lobar pneumonia, unspecified organism: Secondary | ICD-10-CM

## 2020-11-29 DIAGNOSIS — A419 Sepsis, unspecified organism: Secondary | ICD-10-CM | POA: Diagnosis not present

## 2020-11-29 DIAGNOSIS — G9341 Metabolic encephalopathy: Secondary | ICD-10-CM | POA: Diagnosis not present

## 2020-11-29 DIAGNOSIS — D3502 Benign neoplasm of left adrenal gland: Secondary | ICD-10-CM | POA: Diagnosis not present

## 2020-11-29 LAB — COMPREHENSIVE METABOLIC PANEL
ALT: 17 U/L (ref 0–44)
AST: 19 U/L (ref 15–41)
Albumin: 2.4 g/dL — ABNORMAL LOW (ref 3.5–5.0)
Alkaline Phosphatase: 74 U/L (ref 38–126)
Anion gap: 5 (ref 5–15)
BUN: 20 mg/dL (ref 8–23)
CO2: 17 mmol/L — ABNORMAL LOW (ref 22–32)
Calcium: 6.3 mg/dL — CL (ref 8.9–10.3)
Chloride: 119 mmol/L — ABNORMAL HIGH (ref 98–111)
Creatinine, Ser: 0.67 mg/dL (ref 0.44–1.00)
GFR, Estimated: >60 mL/min (ref >60)
Glucose, Bld: 212 mg/dL — ABNORMAL HIGH (ref 70–99)
Potassium: 2.9 mmol/L — ABNORMAL LOW (ref 3.5–5.1)
Sodium: 141 mmol/L (ref 135–145)
Total Bilirubin: 0.7 mg/dL (ref 0.3–1.2)
Total Protein: 3.9 g/dL — ABNORMAL LOW (ref 6.5–8.1)

## 2020-11-29 LAB — PROCALCITONIN: Procalcitonin: 5.41 ng/mL

## 2020-11-29 LAB — CBC
HCT: 33.6 % — ABNORMAL LOW (ref 36.0–46.0)
Hemoglobin: 10.4 g/dL — ABNORMAL LOW (ref 12.0–15.0)
MCH: 25.4 pg — ABNORMAL LOW (ref 26.0–34.0)
MCHC: 31 g/dL (ref 30.0–36.0)
MCV: 82.2 fL (ref 80.0–100.0)
Platelets: 191 10*3/uL (ref 150–400)
RBC: 4.09 MIL/uL (ref 3.87–5.11)
RDW: 21.6 % — ABNORMAL HIGH (ref 11.5–15.5)
WBC: 26 10*3/uL — ABNORMAL HIGH (ref 4.0–10.5)
nRBC: 0 % (ref 0.0–0.2)

## 2020-11-29 LAB — GLUCOSE, CAPILLARY
Glucose-Capillary: 127 mg/dL — ABNORMAL HIGH (ref 70–99)
Glucose-Capillary: 121 mg/dL — ABNORMAL HIGH (ref 70–99)
Glucose-Capillary: 181 mg/dL — ABNORMAL HIGH (ref 70–99)
Glucose-Capillary: 123 mg/dL — ABNORMAL HIGH (ref 70–99)
Glucose-Capillary: 97 mg/dL (ref 70–99)

## 2020-11-29 LAB — MAGNESIUM: Magnesium: 1.9 mg/dL (ref 1.7–2.4)

## 2020-11-29 MED ORDER — CALCIUM GLUCONATE-NACL 1-0.675 GM/50ML-% IV SOLN
1.0000 g | Freq: Once | INTRAVENOUS | Status: AC
  Administered 2020-11-29: 1000 mg via INTRAVENOUS
  Filled 2020-11-29: qty 50

## 2020-11-29 MED ORDER — POTASSIUM CHLORIDE CRYS ER 20 MEQ PO TBCR
20.0000 meq | EXTENDED_RELEASE_TABLET | Freq: Once | ORAL | Status: AC
  Administered 2020-11-29: 20 meq via ORAL
  Filled 2020-11-29: qty 1

## 2020-11-29 MED ORDER — CYANOCOBALAMIN 1000 MCG/ML IJ SOLN
1000.0000 ug | Freq: Once | INTRAMUSCULAR | Status: AC
  Administered 2020-11-29: 1000 ug via INTRAMUSCULAR
  Filled 2020-11-29: qty 1

## 2020-11-29 MED ORDER — FOLIC ACID 1 MG PO TABS
1.0000 mg | ORAL_TABLET | Freq: Every day | ORAL | Status: DC
  Filled 2020-11-29: qty 1

## 2020-11-29 MED ORDER — POTASSIUM CHLORIDE 10 MEQ/100ML IV SOLN
10.0000 meq | INTRAVENOUS | Status: AC
  Administered 2020-11-29 (×4): 10 meq via INTRAVENOUS
  Filled 2020-11-29 (×4): qty 100

## 2020-11-29 MED ORDER — FOLIC ACID 5 MG/ML IJ SOLN
1.0000 mg | Freq: Once | INTRAMUSCULAR | Status: AC
  Administered 2020-11-29: 1 mg via INTRAVENOUS
  Filled 2020-11-29: qty 0.2

## 2020-11-29 MED ORDER — HYDROCODONE-ACETAMINOPHEN 5-325 MG PO TABS: 1.0000 | ORAL_TABLET | Freq: Four times a day (QID) | ORAL | Status: DC | PRN

## 2020-11-29 MED ORDER — VITAMIN B-12 1000 MCG PO TABS
500.0000 ug | ORAL_TABLET | Freq: Every day | ORAL | Status: DC
  Filled 2020-11-29: qty 1

## 2020-11-29 MED ORDER — FUROSEMIDE 10 MG/ML IJ SOLN
20.0000 mg | Freq: Once | INTRAMUSCULAR | Status: AC
  Administered 2020-11-29: 20 mg via INTRAVENOUS
  Filled 2020-11-29: qty 2

## 2020-11-29 MED ORDER — ACETAMINOPHEN 650 MG RE SUPP
650.0000 mg | RECTAL | Status: DC | PRN
  Administered 2020-11-29: 120 mg via RECTAL
  Filled 2020-11-29: qty 1

## 2020-11-29 MED ORDER — SODIUM CHLORIDE 0.9 % IV SOLN
1.0000 mg | Freq: Once | INTRAVENOUS | Status: DC
  Filled 2020-11-29: qty 0.2

## 2020-11-29 NOTE — Progress Notes (Addendum)
PROGRESS NOTE  Jacqueline Hansen PIR:[id number] DOB: [birth year] DOA: 11/17/2020 PCP: Alanson Puls [facility]  Brief History:  80 year old female with history of anxiety, depression, osteoarthritis, diastolic CHF, asthma, recurrent episode of bronchitis and pneumonia, COPD, gastric ulcer, iron deficiency anemia, seizure disorder, DM2, history of PE, hyperlipidemia brought to the hospital for generalized weakness and low blood pressure.  Patient is a very poor historian.  She was admitted with diagnosis of septic shock secondary to urinary tract infection.  She was started on pressors, IV fluids.   Assessment/Plan:   Septic shock secondary to urinary tract infection - Leukocytosis, tachycardia, hypotension.   -PICC line placed 9/23.   -due to pneumonia and UTI -Change LR to normal saline due to lactic acidosis.   -blood culture remain neg -IV vancomycin and cefepime.  Albumin IV for 6 doses.   -Check procalcitonin, BNP, TSH.  Accu-Cheks every 4 hours.  Continue Levophed to maintain MAP greater than 65 COVID/flu- negative -She does have superficial wounds in her lower back and her mid back but does not appear to be infected. 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  CT head not performed on admission but currently unstable to move the patient.  Grossly she is following all the commands and moving all 4 extremities except her left lower extremity.   -Continue to monitor her mental status, will obtain further imaging if necessary.   -secondary to underlying severe infection. -mental status overall improving   Severe iron deficiency anemia History of large gastric ulcer - IV iron ordered.   -Hemoccult is negative.  Had C-scope and endoscopy about 4 weeks ago which was negative at an outside hospital.   -IV PPI daily -supplement B!2 and folate    Acute kidney injury - baseline creatinine 0.6-08 -serum creatinine peaked 1.53 -due to sepsis and volume depletion -improved with IVF   Acute Respiratory distress with Hypoxia, b/l Pleural Effusion Recently had PNA? But also has pleural effusion. Cont on going Abx, Difficult diurese given hypotension.   -initially on 5L  -now stable on 2-3L -9/23 CT abd--new consolidation bilateral LL -PCT 4.17>>5.41 -saline lock IVF -lasix IV x 1   Hyperkalemia - Treated in the ED with medication -now hypokalemic again>>replete   Hypocalcemia - additional 1 dose of IV calcium gluconate.  Continue to monitor, give more as necessary.  Check ionized calcium again tomorrow morning   Hypomagnesemia - Repletion ordered.     Severe leukocytosis - WBC is as high as 61 previously it was normal in 7 range.   -no diarrhea -no concerning WBC precursors -likely leukemoid reaction   History of previous CVA - Has chronic left-sided weakness especially lower extremities.   Per record she has a history of paroxysmal A. fib and PE - Recently she was taken off her anticoagulation.  We will monitor her off of that for now.   Restless leg syndrome - On ropinirole if she can tolerate orals         Status is: Inpatient   Remains inpatient appropriate because:Hemodynamically unstable   Dispo: The patient is from: Home              Anticipated d/c is to: Home              Patient currently is not medically stable to d/c.  Difficult to place patient No        The patient is critically ill with multiple organ systems failure and requires high complexity decision making for assessment and support, frequent evaluation and titration of therapies, application of advanced monitoring technologies and extensive interpretation of multiple databases.  Critical care time - 35 mins.         Family Communication:   daughter updated 9/25   Consultants:  none   Code Status:  FULL    DVT  Prophylaxis:  [place] Heparin      Procedures: As Listed in Progress Note Above   Antibiotics: Vanc 9/23>> Cefepime 9/23>>     Subjective: Patient complains of generalized wekaness.  Denies f/c, cp, nvd, abd pain  Objective: Vitals:   11/29/20 1110 11/29/20 1200 11/29/20 1300 11/29/20 1400  BP:  99/73 105/81 114/80  Pulse: 100 98 98 99  Resp: 18 (!) 25 20 18   Temp: (!) 96.2 F (35.7 C)     TempSrc: Axillary     SpO2: 100% 100% 100% 100%  Weight:      Height:        Intake/Output Summary (Last 24 hours) at 11/29/2020 1502 Last data filed at 11/29/2020 1041 Gross per 24 hour  Intake 5075.48 ml  Output 600 ml  Net 4475.48 ml   Weight change:  Exam:  General:  Pt is alert, follows commands appropriately, not in acute distress HEENT: No icterus, No thrush, No neck mass, [place]/AT Cardiovascular: RRR, S1/S2, no rubs, no gallops Respiratory: bilateral crackle. No wheeze Abdomen: Soft/+BS, non tender, non distended, no guarding Extremities: 1 + LE edema, No lymphangitis, No petechiae, No rashes, no synovitis   Data Reviewed: I have personally reviewed following labs and imaging studies Basic Metabolic Panel: Recent Labs  Lab 11/29/2020 1947 11/27/20 0502 11/27/20 1500 11/28/20 0412 11/29/20 0420  NA 131* 133* 131* 137 141  K 6.5* 5.0 4.0 2.8* 2.9*  CL 103 100 101 108 119*  CO2 21* 23 22 20* 17*  GLUCOSE 164* 90 282* 112* 212*  BUN 35* 35* 33* 30* 20  CREATININE 1.41* 1.41* 1.25* 1.09* 0.67  CALCIUM 7.4* 7.8* 7.1* 7.3* 6.3*  MG  --  1.4*  --  1.7 1.9  PHOS  --  3.3  --   --   --    Liver Function Tests: Recent Labs  Lab 11/30/2020 1741 11/27/20 0502 11/28/20 0412 11/29/20 0420  AST 21 65* 24 19  ALT 17 30 18 17   ALKPHOS 151* 140* 73 74  BILITOT 0.5 0.4 0.2* 0.7  PROT 5.3* 4.7* 4.6* 3.9*  ALBUMIN 1.6* 1.4* 3.0* 2.4*   No results for input(s): LIPASE, AMYLASE in the last 168 hours. No results for input(s): AMMONIA in the last 168 hours. Coagulation  Profile: Recent Labs  Lab 12/03/2020 1741 11/27/20 0502  INR 1.0 1.1   CBC: Recent Labs  Lab 11/13/2020 1741 11/27/20 0502 11/28/20 0412 11/28/20 0734 11/28/20 0940 11/29/20 0420  WBC 44.3* 61.8* 30.3*  --  31.0* 26.0*  NEUTROABS 41.0*  --   --   --   --   --   HGB 10.6* 10.8* 6.3* 6.6* 6.8* 10.4*  HCT 37.9 36.7 21.9* 23.0* 23.2* 33.6*  MCV 78.1* 78.3* 79.3*  --  79.2* 82.2  PLT 573* 488* 274  --  303 191   Cardiac Enzymes: No results for input(s): CKTOTAL, CKMB, CKMBINDEX, TROPONINI in the last 168 hours. BNP: Invalid input(s): POCBNP CBG: Recent Labs  Lab 11/28/20  2015 11/29/20 0059 11/29/20 0500 11/29/20 0800 11/29/20 1112  GLUCAP 128* 127* 121* 181* 123*   HbA1C: Recent Labs    11/27/20 0502  HGBA1C 4.7*   Urine analysis:    Component Value Date/Time   COLORURINE YELLOW 11/13/2020 2010   APPEARANCEUR CLOUDY (A) 11/09/2020 2010   LABSPEC 1.025 11/08/2020 2010   PHURINE 5.0 11/27/2020 2010   GLUCOSEU NEGATIVE 11/07/2020 2010   HGBUR NEGATIVE 11/23/2020 2010   BILIRUBINUR SMALL (A) 11/11/2020 2010   KETONESUR 5 (A) 11/22/2020 2010   PROTEINUR 30 (A) 11/25/2020 2010   NITRITE NEGATIVE 11/10/2020 2010   LEUKOCYTESUR MODERATE (A) 12/04/2020 2010   Sepsis Labs: @LABRCNTIP (procalcitonin:4,lacticidven:4) ) Recent Results (from the past 240 hour(s))  Blood Culture (routine x 2)     Status: None (Preliminary result)   Collection Time: 11/05/2020  5:41 PM   Specimen: Right Antecubital; Blood  Result Value Ref Range Status   Specimen Description RIGHT ANTECUBITAL  Final   Special Requests   Final    Blood Culture results may not be optimal due to an excessive volume of blood received in culture bottles BOTTLES DRAWN AEROBIC AND ANAEROBIC   Culture   Final    NO GROWTH 3 DAYS Performed at [facility], [address], [place], [place] [address]    Report Status PENDING  Incomplete  Blood Culture (routine x 2)     Status: None (Preliminary result)   Collection  Time: 11/16/2020  5:50 PM   Specimen: Left Antecubital; Blood  Result Value Ref Range Status   Specimen Description LEFT ANTECUBITAL  Final   Special Requests   Final    Blood Culture adequate volume BOTTLES DRAWN AEROBIC AND ANAEROBIC   Culture   Final    NO GROWTH 3 DAYS Performed at [facility], [address], [place], [place] [address]    Report Status PENDING  Incomplete  Urine Culture     Status: Abnormal   Collection Time: 11/10/2020  8:10 PM   Specimen: In/Out Cath Urine  Result Value Ref Range Status   Specimen Description   Final    IN/OUT CATH URINE Performed at [facility], [address] [place], [place] [address]    Special Requests   Final    NONE Performed at [facility], [address], [place], [place] [address]    Culture MULTIPLE SPECIES PRESENT, SUGGEST RECOLLECTION (A)  Final   Report Status 11/28/2020 FINAL  Final  C Difficile Quick Screen w PCR reflex     Status: None   Collection Time: 11/27/20 12:34 AM   Specimen: STOOL  Result Value Ref Range Status   C Diff antigen NEGATIVE NEGATIVE Final   C Diff toxin NEGATIVE NEGATIVE Final   C Diff interpretation No C. difficile detected.  Final    Comment: NEGATIVE Performed at [facility], [address] [place], [place] [address]   MRSA Next Gen by PCR, Nasal     Status: Abnormal   Collection Time: 11/27/20 12:36 AM   Specimen: Nasal Mucosa; Nasal Swab  Result Value Ref Range Status   MRSA by PCR Next Gen DETECTED (A) NOT DETECTED Final    Comment: RESULT CALLED TO, READ BACK BY AND VERIFIED WITH: WAGNOR,R@[id number]  BY MATTHEWS, B 9.23.22 (NOTE) The GeneXpert MRSA Assay (FDA approved for NASAL specimens only), is one component of a comprehensive MRSA colonization surveillance program. It is not intended to diagnose MRSA infection nor to guide or monitor treatment for MRSA infections. Test performance is not  FDA approved in patients less than 41 years old. Performed at [facility], [address]  [place], [place] [address]   Resp Panel by RT-PCR (Flu A&B, Covid) Nasopharyngeal Swab     Status: None   Collection Time: 11/27/20  2:06 AM   Specimen: Nasopharyngeal Swab; Nasopharyngeal(NP) swabs in vial transport medium  Result Value Ref Range Status   SARS Coronavirus 2 by RT PCR NEGATIVE NEGATIVE Final    Comment: (NOTE) SARS-CoV-2 target nucleic acids are NOT DETECTED.  The SARS-CoV-2 RNA is generally detectable in upper respiratory specimens during the acute phase of infection. The lowest concentration of SARS-CoV-2 viral copies this assay can detect is 138 copies/mL. A negative result does not preclude SARS-Cov-2 infection and should not be used as the sole basis for treatment or other patient management decisions. A negative result may occur with  improper specimen collection/handling, submission of specimen other than nasopharyngeal swab, presence of viral mutation(s) within the areas targeted by this assay, and inadequate number of viral copies(<138 copies/mL). A negative result must be combined with clinical observations, patient history, and epidemiological information. The expected result is Negative.  Fact Sheet for Patients:  EntrepreneurPulse.com.au  Fact Sheet for Healthcare Providers:  IncredibleEmployment.be  This test is no t yet approved or cleared by the [place] FDA and  has been authorized for detection and/or diagnosis of SARS-CoV-2 by FDA under an Emergency Use Authorization (EUA). This EUA will remain  in effect (meaning this test can be used) for the duration of the COVID-19 declaration under Section 564(b)(1) of the Act, 21 U.S.C.section 360bbb-3(b)(1), unless the authorization is terminated  or revoked sooner.       Influenza A by PCR NEGATIVE NEGATIVE Final   Influenza B by PCR NEGATIVE NEGATIVE Final    Comment: (NOTE) The Xpert Xpress SARS-CoV-2/FLU/RSV plus assay is intended as an aid in the diagnosis of  influenza from Nasopharyngeal swab specimens and should not be used as a sole basis for treatment. Nasal washings and aspirates are unacceptable for Xpert Xpress SARS-CoV-2/FLU/RSV testing.  Fact Sheet for Patients: EntrepreneurPulse.com.au  Fact Sheet for Healthcare Providers: IncredibleEmployment.be  This test is not yet approved or cleared by the [place] FDA and has been authorized for detection and/or diagnosis of SARS-CoV-2 by FDA under an Emergency Use Authorization (EUA). This EUA will remain in effect (meaning this test can be used) for the duration of the COVID-19 declaration under Section 564(b)(1) of the Act, 21 U.S.C. section 360bbb-3(b)(1), unless the authorization is terminated or revoked.  Performed at [facility], [address], [place], [place] [address]      Scheduled Meds:  (feeding supplement) PROSource Plus  30 mL Oral TID BM   Chlorhexidine Gluconate Cloth  6 each Topical Q0600   feeding supplement  237 mL Oral TID BM   furosemide  20 mg Intravenous Once   heparin injection (subcutaneous)  5,000 Units Subcutaneous Q8H   insulin aspart  0-9 Units Subcutaneous Q4H   mouth rinse  15 mL Mouth Rinse BID   nicotine  21 mg Transdermal Daily   pantoprazole (PROTONIX) IV  40 mg Intravenous Q24H   rOPINIRole  1 mg Oral QHS   sodium chloride flush  10-40 mL Intracatheter Q12H   Continuous Infusions:  ceFEPime (MAXIPIME) IV 2 g (11/29/20 0519)   phenylephrine (NEO-SYNEPHRINE) Adult infusion 90 mcg/min (11/29/20 1402)   vancomycin 1,000 mg (11/29/20 0635)    Procedures/Studies: CT ABDOMEN PELVIS WO CONTRAST  Result Date: 11/27/2020 CLINICAL DATA:  Sepsis. EXAM: CT ABDOMEN AND PELVIS WITHOUT CONTRAST TECHNIQUE: Multidetector CT imaging of the abdomen and pelvis was performed following the standard protocol without IV contrast. COMPARISON:  CT abdomen pelvis dated 08/18/2018. FINDINGS: Evaluation of this exam is limited in  the absence of intravenous contrast. Lower chest: Partially visualized small bilateral pleural effusions with near complete consolidative changes of the lower lobes which may represent atelectasis or infiltrate. There is coronary vascular calcification. No intra-abdominal free air or free fluid. Hepatobiliary: The liver is unremarkable. No intrahepatic biliary dilatation. Cholecystectomy. No retained calcified stone noted in the central CBD. Pancreas: The pancreas is atrophic.  No active inflammatory changes. Spleen: Normal in size without focal abnormality. Adrenals/Urinary Tract: The right adrenal gland is unremarkable. 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 There is no hydronephrosis or nephrolithiasis on either side. Interval increase in the size of the right renal interpolar exophytic low attenuating lesion now measuring 2.3 cm. This is suboptimally characterized but demonstrates fluid attenuation, likely a cyst. The visualized ureters and urinary bladder appear unremarkable. Stomach/Bowel: There is no bowel obstruction or active inflammation. 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 The appendix is not visualized with certainty. No inflammatory changes identified in the right lower quadrant. Vascular/Lymphatic: Advanced aortoiliac atherosclerotic disease. The IVC is unremarkable. No portal venous gas. There is no adenopathy. Reproductive: Hysterectomy.  No adnexal masses. Other: Small fat containing umbilical hernia. Musculoskeletal: Osteopenia with degenerative changes of the spine. No acute osseous pathology. IMPRESSION: 1. No acute intra-abdominal or pelvic pathology. No bowel obstruction. 2. Partially visualized small bilateral pleural effusions with near complete consolidative changes of the lower lobes which may represent atelectasis or infiltrate. 3. Left adrenal adenoma. 4. Aortic Atherosclerosis (ICD10-I70.0). Electronically Signed   By: Anner Crete M.D.   On: 11/27/2020 00:10    DG Chest Port 1 View  Result Date: 11/27/2020 CLINICAL DATA:  Altered mental status. Dyspnea. History of congestive heart failure, asthma and COPD. EXAM: PORTABLE CHEST 1 VIEW COMPARISON:  Radiographs 11/13/2020 and 10/29/2020.  CT 09/26/2020. FINDINGS: 1423 hours. Right arm PICC projects to the level of the superior cavoatrial junction. The heart size and mediastinal contours are stable. There is aortic atherosclerosis. Small pleural effusions and mild bibasilar atelectasis are similar to the most recent prior study. There is no confluent airspace opacity, edema or pneumothorax. The bones appear unchanged. IMPRESSION: Similar appearance of the chest with small pleural effusions and mild bibasilar atelectasis. No new findings. Electronically Signed   By: Richardean Sale M.D.   On: 11/27/2020 14:59   DG Chest Port 1 View  Result Date: 11/21/2020 CLINICAL DATA:  Questionable sepsis - evaluate for abnormality EXAM: PORTABLE CHEST 1 VIEW COMPARISON:  10/29/2020 FINDINGS: Right upper extremity PICC has been removed. Improved hazy lung base opacities with persistent bilateral pleural effusion. Fluid tracks into the right minor fissure. Associated bibasilar opacities favor atelectasis, overall improved from prior. Upper normal heart size. Aortic atherosclerosis. No pneumothorax. IMPRESSION: Improved hazy lung base opacities with persistent bilateral pleural effusions and atelectasis. Overall improved aeration from prior imaging. Electronically Signed   By: Keith Rake M.D.   On: 11/22/2020 19:49   [place] EKG SITE RITE  Result Date: 11/27/2020 If Site Rite image not attached, placement could not be confirmed due to current cardiac rhythm.   Orson Eva, DO  [facility]  If 7PM-7AM, please contact night-coverage [email or web address] Password [email or web address] 11/29/2020, 3:02 PM   LOS: 3 days

## 2020-11-30 ENCOUNTER — Encounter (HOSPITAL_COMMUNITY): Payer: Self-pay | Admitting: Internal Medicine

## 2020-11-30 ENCOUNTER — Inpatient Hospital Stay (HOSPITAL_COMMUNITY): Payer: 59

## 2020-11-30 DIAGNOSIS — I5031 Acute diastolic (congestive) heart failure: Secondary | ICD-10-CM | POA: Diagnosis not present

## 2020-11-30 DIAGNOSIS — J181 Lobar pneumonia, unspecified organism: Secondary | ICD-10-CM

## 2020-11-30 DIAGNOSIS — R0603 Acute respiratory distress: Secondary | ICD-10-CM

## 2020-11-30 DIAGNOSIS — G9341 Metabolic encephalopathy: Secondary | ICD-10-CM | POA: Diagnosis not present

## 2020-11-30 DIAGNOSIS — A419 Sepsis, unspecified organism: Secondary | ICD-10-CM | POA: Diagnosis not present

## 2020-11-30 DIAGNOSIS — J9621 Acute and chronic respiratory failure with hypoxia: Secondary | ICD-10-CM | POA: Diagnosis not present

## 2020-11-30 DIAGNOSIS — N10 Acute tubulo-interstitial nephritis: Secondary | ICD-10-CM

## 2020-11-30 DIAGNOSIS — Z7189 Other specified counseling: Secondary | ICD-10-CM

## 2020-11-30 DIAGNOSIS — Z515 Encounter for palliative care: Secondary | ICD-10-CM

## 2020-11-30 LAB — BPAM RBC
Blood Product Expiration Date: 202210012359
Blood Product Expiration Date: 202210212359
ISSUE DATE / TIME: 202209241327
ISSUE DATE / TIME: 202209241629
Unit Type and Rh: 9500
Unit Type and Rh: 9500

## 2020-11-30 LAB — BLOOD GAS, ARTERIAL
Acid-base deficit: 13.2 mmol/L — ABNORMAL HIGH (ref 0.0–2.0)
Bicarbonate: 14 mmol/L — ABNORMAL LOW (ref 20.0–28.0)
FIO2: 36
O2 Saturation: 95.2 %
Patient temperature: 35.8
pCO2 arterial: 32.3 mmHg (ref 32.0–48.0)
pH, Arterial: 7.225 — ABNORMAL LOW (ref 7.350–7.450)
pO2, Arterial: 86.5 mmHg (ref 83.0–108.0)

## 2020-11-30 LAB — CBC
HCT: 37.8 % (ref 36.0–46.0)
Hemoglobin: 11.3 g/dL — ABNORMAL LOW (ref 12.0–15.0)
MCH: 25 pg — ABNORMAL LOW (ref 26.0–34.0)
MCHC: 29.9 g/dL — ABNORMAL LOW (ref 30.0–36.0)
MCV: 83.6 fL (ref 80.0–100.0)
Platelets: 158 10*3/uL (ref 150–400)
RBC: 4.52 MIL/uL (ref 3.87–5.11)
RDW: 22.5 % — ABNORMAL HIGH (ref 11.5–15.5)
WBC: 37.1 10*3/uL — ABNORMAL HIGH (ref 4.0–10.5)
nRBC: 0.1 % (ref 0.0–0.2)

## 2020-11-30 LAB — TYPE AND SCREEN
ABO/RH(D): O NEG
Antibody Screen: NEGATIVE
Unit division: 0
Unit division: 0

## 2020-11-30 LAB — COMPREHENSIVE METABOLIC PANEL
ALT: 21 U/L (ref 0–44)
ALT: 19 U/L (ref 0–44)
AST: 21 U/L (ref 15–41)
AST: 18 U/L (ref 15–41)
Albumin: 2.2 g/dL — ABNORMAL LOW (ref 3.5–5.0)
Albumin: 2.1 g/dL — ABNORMAL LOW (ref 3.5–5.0)
Alkaline Phosphatase: 96 U/L (ref 38–126)
Alkaline Phosphatase: 110 U/L (ref 38–126)
Anion gap: 5 (ref 5–15)
Anion gap: 9 (ref 5–15)
BUN: 19 mg/dL (ref 8–23)
BUN: 20 mg/dL (ref 8–23)
CO2: 17 mmol/L — ABNORMAL LOW (ref 22–32)
CO2: 17 mmol/L — ABNORMAL LOW (ref 22–32)
Calcium: 7.2 mg/dL — ABNORMAL LOW (ref 8.9–10.3)
Calcium: 7.1 mg/dL — ABNORMAL LOW (ref 8.9–10.3)
Chloride: 122 mmol/L — ABNORMAL HIGH (ref 98–111)
Chloride: 120 mmol/L — ABNORMAL HIGH (ref 98–111)
Creatinine, Ser: 0.76 mg/dL (ref 0.44–1.00)
Creatinine, Ser: 0.9 mg/dL (ref 0.44–1.00)
GFR, Estimated: >60 mL/min (ref >60)
GFR, Estimated: >60 mL/min (ref >60)
Glucose, Bld: 123 mg/dL — ABNORMAL HIGH (ref 70–99)
Glucose, Bld: 148 mg/dL — ABNORMAL HIGH (ref 70–99)
Potassium: 3.6 mmol/L (ref 3.5–5.1)
Potassium: 3.5 mmol/L (ref 3.5–5.1)
Sodium: 144 mmol/L (ref 135–145)
Sodium: 146 mmol/L — ABNORMAL HIGH (ref 135–145)
Total Bilirubin: 0.9 mg/dL (ref 0.3–1.2)
Total Bilirubin: 0.7 mg/dL (ref 0.3–1.2)
Total Protein: 4.1 g/dL — ABNORMAL LOW (ref 6.5–8.1)
Total Protein: 4 g/dL — ABNORMAL LOW (ref 6.5–8.1)

## 2020-11-30 LAB — ECHOCARDIOGRAM COMPLETE
AR max vel: 1.82 cm2
AV Area VTI: 1.71 cm2
AV Area mean vel: 1.6 cm2
AV Mean grad: 5 mmHg
AV Peak grad: 9.9 mmHg
Ao pk vel: 1.57 m/s
Area-P 1/2: 4.12 cm2
Height: 60 in
MV VTI: 2.15 cm2
S' Lateral: 2.2 cm
Weight: 2440.93 oz

## 2020-11-30 LAB — CBC WITH DIFFERENTIAL/PLATELET
Abs Immature Granulocytes: 1.19 10*3/uL — ABNORMAL HIGH (ref 0.00–0.07)
Basophils Absolute: 0.2 10*3/uL — ABNORMAL HIGH (ref 0.0–0.1)
Basophils Relative: 0 %
Eosinophils Absolute: 0 10*3/uL (ref 0.0–0.5)
Eosinophils Relative: 0 %
HCT: 38.9 % (ref 36.0–46.0)
Hemoglobin: 11.6 g/dL — ABNORMAL LOW (ref 12.0–15.0)
Immature Granulocytes: 3 %
Lymphocytes Relative: 2 %
Lymphs Abs: 0.7 10*3/uL (ref 0.7–4.0)
MCH: 24.6 pg — ABNORMAL LOW (ref 26.0–34.0)
MCHC: 29.8 g/dL — ABNORMAL LOW (ref 30.0–36.0)
MCV: 82.4 fL (ref 80.0–100.0)
Monocytes Absolute: 0.7 10*3/uL (ref 0.1–1.0)
Monocytes Relative: 2 %
Neutro Abs: 35.9 10*3/uL — ABNORMAL HIGH (ref 1.7–7.7)
Neutrophils Relative %: 93 %
Platelets: 181 10*3/uL (ref 150–400)
RBC: 4.72 MIL/uL (ref 3.87–5.11)
RDW: 22.5 % — ABNORMAL HIGH (ref 11.5–15.5)
WBC: 38.5 10*3/uL — ABNORMAL HIGH (ref 4.0–10.5)
nRBC: 0.1 % (ref 0.0–0.2)

## 2020-11-30 LAB — PREPARE RBC (CROSSMATCH)

## 2020-11-30 LAB — PROCALCITONIN: Procalcitonin: 3.28 ng/mL

## 2020-11-30 LAB — MAGNESIUM: Magnesium: 1.8 mg/dL (ref 1.7–2.4)

## 2020-11-30 LAB — CALCIUM, IONIZED: Calcium, Ionized, Serum: 4.3 mg/dL — ABNORMAL LOW (ref 4.5–5.6)

## 2020-11-30 MED ORDER — HYDROMORPHONE HCL 1 MG/ML IJ SOLN
0.5000 mg | INTRAMUSCULAR | Status: DC | PRN
  Administered 2020-11-30 (×2): 0.5 mg via INTRAVENOUS
  Filled 2020-11-30 (×2): qty 0.5

## 2020-11-30 MED ORDER — SODIUM BICARBONATE 8.4 % IV SOLN: 50.0000 meq | Freq: Once | INTRAVENOUS | Status: DC

## 2020-11-30 MED ORDER — GLYCOPYRROLATE 0.2 MG/ML IJ SOLN
0.1000 mg | Freq: Four times a day (QID) | INTRAMUSCULAR | Status: DC | PRN
  Administered 2020-11-30: 0.1 mg via INTRAVENOUS
  Filled 2020-11-30: qty 1

## 2020-11-30 MED ORDER — HYDROCORTISONE SOD SUC (PF) 100 MG IJ SOLR
100.0000 mg | Freq: Three times a day (TID) | INTRAMUSCULAR | Status: DC
  Administered 2020-11-30: 100 mg via INTRAVENOUS
  Filled 2020-11-30: qty 2

## 2020-11-30 MED ORDER — MUPIROCIN 2 % EX OINT
TOPICAL_OINTMENT | Freq: Two times a day (BID) | CUTANEOUS | Status: DC
  Administered 2020-11-30: 1 via NASAL
  Filled 2020-11-30: qty 22

## 2020-11-30 MED ORDER — FOLIC ACID 5 MG/ML IJ SOLN
1.0000 mg | Freq: Every day | INTRAMUSCULAR | Status: DC
  Administered 2020-11-30: 1 mg via INTRAVENOUS
  Filled 2020-11-30: qty 0.2

## 2020-11-30 MED ORDER — POTASSIUM CHLORIDE 10 MEQ/100ML IV SOLN
10.0000 meq | INTRAVENOUS | Status: AC
  Administered 2020-11-30 (×3): 10 meq via INTRAVENOUS
  Filled 2020-11-30 (×3): qty 100

## 2020-11-30 MED ORDER — SODIUM BICARBONATE 8.4 % IV SOLN
100.0000 meq | Freq: Once | INTRAVENOUS | Status: AC
  Administered 2020-11-30: 100 meq via INTRAVENOUS

## 2020-11-30 MED ORDER — SODIUM BICARBONATE 8.4 % IV SOLN
INTRAVENOUS | Status: AC
  Filled 2020-11-30: qty 50

## 2020-11-30 MED ORDER — MORPHINE SULFATE (PF) 2 MG/ML IV SOLN
2.0000 mg | INTRAVENOUS | Status: DC | PRN
Start: 2020-11-30 — End: 2020-11-30

## 2020-11-30 MED ORDER — LORAZEPAM 2 MG/ML IJ SOLN: 1.0000 mg | INTRAMUSCULAR | Status: DC | PRN

## 2020-11-30 MED ORDER — CALCIUM GLUCONATE-NACL 1-0.675 GM/50ML-% IV SOLN
1.0000 g | Freq: Once | INTRAVENOUS | Status: AC
  Administered 2020-11-30: 1000 mg via INTRAVENOUS
  Filled 2020-11-30: qty 50

## 2020-11-30 MED ORDER — MAGNESIUM SULFATE 2 GM/50ML IV SOLN
2.0000 g | Freq: Once | INTRAVENOUS | Status: AC
  Administered 2020-11-30: 2 g via INTRAVENOUS
  Filled 2020-11-30: qty 50

## 2020-11-30 MED ORDER — LORAZEPAM 2 MG/ML IJ SOLN
1.0000 mg | INTRAMUSCULAR | Status: DC
  Filled 2020-11-30: qty 1

## 2020-11-30 MED ORDER — VANCOMYCIN HCL IN DEXTROSE 1-5 GM/200ML-% IV SOLN: 1000.0000 mg | INTRAVENOUS | Status: DC

## 2020-11-30 MED ORDER — FUROSEMIDE 10 MG/ML IJ SOLN
80.0000 mg | Freq: Once | INTRAMUSCULAR | Status: AC
  Administered 2020-11-30: 80 mg via INTRAVENOUS
  Filled 2020-11-30: qty 8

## 2020-11-30 MED ORDER — CYANOCOBALAMIN 1000 MCG/ML IJ SOLN
1000.0000 ug | INTRAMUSCULAR | Status: DC
  Administered 2020-11-30: 1000 ug via INTRAMUSCULAR
  Filled 2020-11-30: qty 1

## 2020-11-30 NOTE — Plan of Care (Signed)
Patient is now "comfort measures" only. Patient is moribund, DNR; family present at bedside.   Problem: Education: Goal: Knowledge of General Education information will improve Description: Including pain rating scale, medication(s)/side effects and non-pharmacologic comfort measures Outcome: Not Progressing   Problem: Health Behavior/Discharge Planning: Goal: Ability to manage health-related needs will improve Outcome: Not Progressing   Problem: Clinical Measurements: Goal: Ability to maintain clinical measurements within normal limits will improve Outcome: Not Progressing Goal: Will remain free from infection Outcome: Not Progressing Goal: Diagnostic test results will improve Outcome: Not Progressing Goal: Respiratory complications will improve Outcome: Not Progressing Goal: Cardiovascular complication will be avoided Outcome: Not Progressing   Problem: Activity: Goal: Risk for activity intolerance will decrease Outcome: Not Progressing   Problem: Nutrition: Goal: Adequate nutrition will be maintained Outcome: Not Progressing   Problem: Coping: Goal: Level of anxiety will decrease Outcome: Not Progressing   Problem: Elimination: Goal: Will not experience complications related to bowel motility Outcome: Not Progressing Goal: Will not experience complications related to urinary retention Outcome: Not Progressing   Problem: Pain Managment: Goal: General experience of comfort will improve Outcome: Not Progressing   Problem: Safety: Goal: Ability to remain free from injury will improve Outcome: Not Progressing   Problem: Skin Integrity: Goal: Risk for impaired skin integrity will decrease Outcome: Not Progressing

## 2020-11-30 NOTE — Progress Notes (Signed)
PROGRESS NOTE  Jacqueline Hansen PJA:[id number] DOB: 05-28-1940 DOA: 11/29/2020 PCP: Alanson Puls [facility]  Brief History:  80 year old female with history of anxiety, depression, osteoarthritis, diastolic CHF, asthma, recurrent episode of bronchitis and pneumonia, COPD, gastric ulcer, iron deficiency anemia, seizure disorder, DM2, history of PE, hyperlipidemia brought to the hospital for generalized weakness and low blood pressure.  Patient is a very poor historian.  She was admitted with diagnosis of septic shock secondary to urinary tract infection.  She was started on pressors, IV fluids.  Although there was some initial stabilization for the next 24-48 hours, the patient declined clinically in early morning 11/30/20 with dropping BP and increasing vasopressor demand.  Family was updated daily on patient's condition and goals of care were discussed.  They wished to continue full scope of care.   Assessment/Plan:   Septic shock secondary to urinary tract infection - Leukocytosis, tachycardia, hypotension.   -PICC line placed 9/23.   -due to pneumonia and UTI -Change LR to normal saline due to lactic acidosis.   -blood culture remain neg -IV vancomycin and cefepime.  Albumin IV for 6 doses.   -Check procalcitonin, BNP, TSH.  Accu-Cheks every 4 hours.  Continue Levophed to maintain MAP greater than 65 COVID/flu- negative -She does have superficial wounds in her lower back and her mid back but does not appear to be infected. 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  CT head not performed on admission but currently unstable to move the patient.  Grossly she is following all the commands and moving all 4 extremities except her left lower  extremity.   -Continue to monitor her mental status, will obtain further imaging if necessary.   -secondary to underlying severe infection. -9/26 mental status worsening--more somnolent;   Severe iron deficiency anemia History of large gastric ulcer - IV iron ordered.   -Hemoccult is negative.  Had C-scope and endoscopy about 4 weeks ago which was negative at an outside hospital.   -IV PPI daily -supplement B!2 and folate   Acute kidney injury - baseline creatinine 0.6-08 -serum creatinine peaked 1.53 -due to sepsis and volume depletion -improved with IVF intially>>saline lock due to fluid overload   Acute Respiratory distress with Hypoxia, b/l Pleural Effusion Recently had PNA? But also has pleural effusion. Cont on going Abx, Difficult diurese given hypotension.   -initially on 5L [place]>>weaned to 2L -9/23 CT abd--new consolidation bilateral LL -PCT 4.17>>5.41 -saline lock IVF -lasix IV x 1 on 9/25 -11/30/20 CXR--personally reviewed--increased interstitial markings; bilateral pleural effusions -9/26 now requires 5L [place] again   Hyperkalemia>>Hypokalemia - Treated in the ED with medication -now hypokalemic again>>replete   Hypocalcemia - give additional 1 dose of IV calcium gluconate.   Hypomagnesemia - Repletion ordered.     Severe leukocytosis - WBC is as high as 61 previously it was normal in 7 range.   -no diarrhea -no concerning WBC precursors -likely leukemoid reaction   History of previous CVA - Has chronic left-sided weakness especially lower extremities.   Per record she has a history of paroxysmal A. fib and PE - Recently she was taken off her anticoagulation.  We will monitor her off of that for now.   Restless leg syndrome - On ropinirole if she  can tolerate orals  Goals of Care -updated daughter and grandson 9/26 -Advance care planning, including the explanation and discussion of advance directives was carried out with the patient and family.  Code  status including explanations of "Full Code" and "DNR" and alternatives were discussed in detail.  Discussion of end-of-life issues including but not limited palliative care, hospice care and the concept of hospice, other end-of-life care options, power of attorney for health care decisions, living wills, and physician orders for life-sustaining treatment were also discussed with the patient and family.  Total face to face time 16 minutes. -they wish to continue full scope of care presently         Status is: Inpatient   Remains inpatient appropriate because:Hemodynamically unstable   Dispo: The patient is from: Home              Anticipated d/c is to: Home              Patient currently is not medically stable to d/c.              Difficult to place patient No        The patient is critically ill with multiple organ systems failure and requires high complexity decision making for assessment and support, frequent evaluation and titration of therapies, application of advanced monitoring technologies and extensive interpretation of multiple databases.  Critical care time - 45 mins.          Family Communication:   daughter and son updated 9/26   Consultants:  none   Code Status:  FULL    DVT Prophylaxis:  [place] Heparin      Procedures: As Listed in Progress Note Above   Antibiotics: Vanc 9/23>> Cefepime 9/23>>    Subjective:   Objective: Vitals:   11/30/20 0715 11/30/20 0746 11/30/20 0800 11/30/20 0838  BP: 106/67 102/72 100/70 (!) 102/59  Pulse: (!) 104 (!) 104 (!) 103 (!) 107  Resp: (!) 26 18 (!) 28 (!) (P) 22  Temp: (!) 96.5 F (35.8 C)     TempSrc: Axillary     SpO2: 96% 98% 96% 96%  Weight:      Height:        Intake/Output Summary (Last 24 hours) at 11/30/2020 1032 Last data filed at 11/30/2020 0845 Gross per 24 hour  Intake 3235.97 ml  Output 800 ml  Net 2435.97 ml   Weight change:  Exam:  General:  Pt is alert, follows commands appropriately, not  in acute distress HEENT: No icterus, No thrush, No neck mass, [place]/AT Cardiovascular: RRR, S1/S2, no rubs, no gallops Respiratory: CTA bilaterally, no wheezing, no crackles, no rhonchi Abdomen: Soft/+BS, non tender, non distended, no guarding Extremities: No edema, No lymphangitis, No petechiae, No rashes, no synovitis   Data Reviewed: I have personally reviewed following labs and imaging studies Basic Metabolic Panel: Recent Labs  Lab 11/27/20 0502 11/27/20 1500 11/28/20 0412 11/29/20 0420 11/30/20 0351  NA 133* 131* 137 141 144  K 5.0 4.0 2.8* 2.9* 3.6  CL 100 101 108 119* 122*  CO2 23 22 20* 17* 17*  GLUCOSE 90 282* 112* 212* 123*  BUN 35* 33* 30* 20 19  CREATININE 1.41* 1.25* 1.09* 0.67 0.76  CALCIUM 7.8* 7.1* 7.3* 6.3* 7.2*  MG 1.4*  --  1.7 1.9 1.8  PHOS 3.3  --   --   --   --    Liver Function Tests: Recent Labs  Lab 11/25/2020 1741 11/27/20 0502 11/28/20  2353 11/29/20 0420 11/30/20 0351  AST 21 65* 24 19 21   ALT 17 30 18 17 21   ALKPHOS 151* 140* 73 74 96  BILITOT 0.5 0.4 0.2* 0.7 0.9  PROT 5.3* 4.7* 4.6* 3.9* 4.1*  ALBUMIN 1.6* 1.4* 3.0* 2.4* 2.2*   No results for input(s): LIPASE, AMYLASE in the last 168 hours. No results for input(s): AMMONIA in the last 168 hours. Coagulation Profile: Recent Labs  Lab 11/09/2020 1741 11/27/20 0502  INR 1.0 1.1   CBC: Recent Labs  Lab 11/14/2020 1741 11/27/20 0502 11/28/20 0412 11/28/20 0734 11/28/20 0940 11/29/20 0420 11/30/20 0351  WBC 44.3* 61.8* 30.3*  --  31.0* 26.0* 38.5*  NEUTROABS 41.0*  --   --   --   --   --  35.9*  HGB 10.6* 10.8* 6.3* 6.6* 6.8* 10.4* 11.6*  HCT 37.9 36.7 21.9* 23.0* 23.2* 33.6* 38.9  MCV 78.1* 78.3* 79.3*  --  79.2* 82.2 82.4  PLT 573* 488* 274  --  303 191 181   Cardiac Enzymes: No results for input(s): CKTOTAL, CKMB, CKMBINDEX, TROPONINI in the last 168 hours. BNP: Invalid input(s): POCBNP CBG: Recent Labs  Lab 11/29/20 0059 11/29/20 0500 11/29/20 0800 11/29/20 1112  11/29/20 1631  GLUCAP 127* 121* 181* 123* 97   HbA1C: No results for input(s): HGBA1C in the last 72 hours. Urine analysis:    Component Value Date/Time   COLORURINE YELLOW 11/30/2020 2010   APPEARANCEUR CLOUDY (A) 12/03/2020 2010   LABSPEC 1.025 11/05/2020 2010   PHURINE 5.0 11/09/2020 2010   GLUCOSEU NEGATIVE 11/15/2020 2010   HGBUR NEGATIVE 11/17/2020 2010   BILIRUBINUR SMALL (A) 11/12/2020 2010   KETONESUR 5 (A) 11/15/2020 2010   PROTEINUR 30 (A) 11/07/2020 2010   NITRITE NEGATIVE 11/28/2020 2010   LEUKOCYTESUR MODERATE (A) 11/20/2020 2010   Sepsis Labs: @LABRCNTIP (procalcitonin:4,lacticidven:4) ) Recent Results (from the past 240 hour(s))  Blood Culture (routine x 2)     Status: None (Preliminary result)   Collection Time: 11/26/2020  5:41 PM   Specimen: Right Antecubital; Blood  Result Value Ref Range Status   Specimen Description RIGHT ANTECUBITAL  Final   Special Requests   Final    Blood Culture results may not be optimal due to an excessive volume of blood received in culture bottles BOTTLES DRAWN AEROBIC AND ANAEROBIC   Culture   Final    NO GROWTH 4 DAYS Performed at [facility], [address] [place], [place] [address]    Report Status PENDING  Incomplete  Blood Culture (routine x 2)     Status: None (Preliminary result)   Collection Time: 11/14/2020  5:50 PM   Specimen: Left Antecubital; Blood  Result Value Ref Range Status   Specimen Description LEFT ANTECUBITAL  Final   Special Requests   Final    Blood Culture adequate volume BOTTLES DRAWN AEROBIC AND ANAEROBIC   Culture   Final    NO GROWTH 4 DAYS Performed at [facility], [address], [place], [place] [address]    Report Status PENDING  Incomplete  Urine Culture     Status: Abnormal   Collection Time: 11/14/2020  8:10 PM   Specimen: In/Out Cath Urine  Result Value Ref Range Status   Specimen Description   Final    IN/OUT CATH URINE Performed at [facility], [address], [place], [place]  [address]    Special Requests   Final    NONE Performed at [facility], [address] [place], [place]  [address]    Culture MULTIPLE SPECIES PRESENT, SUGGEST RECOLLECTION (A)  Final   Report Status 11/28/2020 FINAL  Final  C Difficile Quick Screen w PCR reflex     Status: None   Collection Time: 11/27/20 12:34 AM   Specimen: STOOL  Result Value Ref Range Status   C Diff antigen NEGATIVE NEGATIVE Final   C Diff toxin NEGATIVE NEGATIVE Final   C Diff interpretation No C. difficile detected.  Final    Comment: NEGATIVE Performed at [facility], [address], [place], [place] [address]   MRSA Next Gen by PCR, Nasal     Status: Abnormal   Collection Time: 11/27/20 12:36 AM   Specimen: Nasal Mucosa; Nasal Swab  Result Value Ref Range Status   MRSA by PCR Next Gen DETECTED (A) NOT DETECTED Final    Comment: RESULT CALLED TO, READ BACK BY AND VERIFIED WITH: WAGNOR,R@[id number]  BY MATTHEWS, B 9.23.22 (NOTE) The GeneXpert MRSA Assay (FDA approved for NASAL specimens only), is one component of a comprehensive MRSA colonization surveillance program. It is not intended to diagnose MRSA infection nor to guide or monitor treatment for MRSA infections. Test performance is not FDA approved in patients less than 77 years old. Performed at [facility], [address], [place], [place] [address]   Resp Panel by RT-PCR (Flu A&B, Covid) Nasopharyngeal Swab     Status: None   Collection Time: 11/27/20  2:06 AM   Specimen: Nasopharyngeal Swab; Nasopharyngeal(NP) swabs in vial transport medium  Result Value Ref Range Status   SARS Coronavirus 2 by RT PCR NEGATIVE NEGATIVE Final    Comment: (NOTE) SARS-CoV-2 target nucleic acids are NOT DETECTED.  The SARS-CoV-2 RNA is generally detectable in upper respiratory specimens during the acute phase of infection. The lowest concentration of SARS-CoV-2 viral copies this assay can detect is 138 copies/mL. A negative result does not preclude SARS-Cov-2 infection  and should not be used as the sole basis for treatment or other patient management decisions. A negative result may occur with  improper specimen collection/handling, submission of specimen other than nasopharyngeal swab, presence of viral mutation(s) within the areas targeted by this assay, and inadequate number of viral copies(<138 copies/mL). A negative result must be combined with clinical observations, patient history, and epidemiological information. The expected result is Negative.  Fact Sheet for Patients:  EntrepreneurPulse.com.au  Fact Sheet for Healthcare Providers:  IncredibleEmployment.be  This test is no t yet approved or cleared by the [place] FDA and  has been authorized for detection and/or diagnosis of SARS-CoV-2 by FDA under an Emergency Use Authorization (EUA). This EUA will remain  in effect (meaning this test can be used) for the duration of the COVID-19 declaration under Section 564(b)(1) of the Act, 21 U.S.C.section 360bbb-3(b)(1), unless the authorization is terminated  or revoked sooner.       Influenza A by PCR NEGATIVE NEGATIVE Final   Influenza B by PCR NEGATIVE NEGATIVE Final    Comment: (NOTE) The Xpert Xpress SARS-CoV-2/FLU/RSV plus assay is intended as an aid in the diagnosis of influenza from Nasopharyngeal swab specimens and should not be used as a sole basis for treatment. Nasal washings and aspirates are unacceptable for Xpert Xpress SARS-CoV-2/FLU/RSV testing.  Fact Sheet for Patients: EntrepreneurPulse.com.au  Fact Sheet for Healthcare Providers: IncredibleEmployment.be  This test is not yet approved or cleared by the [place] FDA and has been authorized for detection and/or diagnosis of SARS-CoV-2 by FDA under an Emergency Use Authorization (EUA). This EUA will  remain in effect (meaning this test can be used) for the duration of the COVID-19 declaration  under Section 564(b)(1) of the Act, 21 U.S.C. section 360bbb-3(b)(1), unless the authorization is terminated or revoked.  Performed at [facility], [address] [place], [place] [address]      Scheduled Meds:  (feeding supplement) PROSource Plus  30 mL Oral TID BM   Chlorhexidine Gluconate Cloth  6 each Topical Q0600   feeding supplement  237 mL Oral TID BM   folic acid  1 mg Oral Daily   heparin injection (subcutaneous)  5,000 Units Subcutaneous Q8H   mouth rinse  15 mL Mouth Rinse BID   mupirocin ointment   Nasal BID   nicotine  21 mg Transdermal Daily   pantoprazole (PROTONIX) IV  40 mg Intravenous Q24H   rOPINIRole  1 mg Oral QHS   sodium chloride flush  10-40 mL Intracatheter Q12H   vitamin B-12  500 mcg Oral Daily   Continuous Infusions:  ceFEPime (MAXIPIME) IV 2 g (11/30/20 0606)   phenylephrine (NEO-SYNEPHRINE) Adult infusion 160 mcg/min (11/30/20 0959)   vancomycin 1,000 mg (11/29/20 0635)    Procedures/Studies: CT ABDOMEN PELVIS WO CONTRAST  Result Date: 11/27/2020 CLINICAL DATA:  Sepsis. EXAM: CT ABDOMEN AND PELVIS WITHOUT CONTRAST TECHNIQUE: Multidetector CT imaging of the abdomen and pelvis was performed following the standard protocol without IV contrast. COMPARISON:  CT abdomen pelvis dated 08/18/2018. FINDINGS: Evaluation of this exam is limited in the absence of intravenous contrast. Lower chest: Partially visualized small bilateral pleural effusions with near complete consolidative changes of the lower lobes which may represent atelectasis or infiltrate. There is coronary vascular calcification. No intra-abdominal free air or free fluid. Hepatobiliary: The liver is unremarkable. No intrahepatic biliary dilatation. Cholecystectomy. No retained calcified stone noted in the central CBD. Pancreas: The pancreas is atrophic.  No active inflammatory changes. Spleen: Normal in size without focal abnormality. Adrenals/Urinary Tract: The right adrenal gland is unremarkable.  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 There is no hydronephrosis or nephrolithiasis on either side. Interval increase in the size of the right renal interpolar exophytic low attenuating lesion now measuring 2.3 cm. This is suboptimally characterized but demonstrates fluid attenuation, likely a cyst. The visualized ureters and urinary bladder appear unremarkable. Stomach/Bowel: There is no bowel obstruction or active inflammation. 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 The appendix is not visualized with certainty. No inflammatory changes identified in the right lower quadrant. Vascular/Lymphatic: Advanced aortoiliac atherosclerotic disease. The IVC is unremarkable. No portal venous gas. There is no adenopathy. Reproductive: Hysterectomy.  No adnexal masses. Other: Small fat containing umbilical hernia. Musculoskeletal: Osteopenia with degenerative changes of the spine. No acute osseous pathology. IMPRESSION: 1. No acute intra-abdominal or pelvic pathology. No bowel obstruction. 2. Partially visualized small bilateral pleural effusions with near complete consolidative changes of the lower lobes which may represent atelectasis or infiltrate. 3. Left adrenal adenoma. 4. Aortic Atherosclerosis (ICD10-I70.0). Electronically Signed   By: Anner Crete M.D.   On: 11/27/2020 00:10   DG Chest Port 1 View  Result Date: 11/27/2020 CLINICAL DATA:  Altered mental status. Dyspnea. History of congestive heart failure, asthma and COPD. EXAM: PORTABLE CHEST 1 VIEW COMPARISON:  Radiographs 11/11/2020 and 10/29/2020.  CT 09/26/2020. FINDINGS: 1423 hours. Right arm PICC projects to the level of the superior cavoatrial junction. The heart size and mediastinal contours are stable. There is aortic atherosclerosis. Small pleural effusions and mild bibasilar atelectasis are similar to  the most recent prior study. There is no confluent airspace opacity, edema or pneumothorax. The bones appear unchanged. IMPRESSION:  Similar appearance of the chest with small pleural effusions and mild bibasilar atelectasis. No new findings. Electronically Signed   By: Richardean Sale M.D.   On: 11/27/2020 14:59   DG Chest Port 1 View  Result Date: 11/29/2020 CLINICAL DATA:  Questionable sepsis - evaluate for abnormality EXAM: PORTABLE CHEST 1 VIEW COMPARISON:  10/29/2020 FINDINGS: Right upper extremity PICC has been removed. Improved hazy lung base opacities with persistent bilateral pleural effusion. Fluid tracks into the right minor fissure. Associated bibasilar opacities favor atelectasis, overall improved from prior. Upper normal heart size. Aortic atherosclerosis. No pneumothorax. IMPRESSION: Improved hazy lung base opacities with persistent bilateral pleural effusions and atelectasis. Overall improved aeration from prior imaging. Electronically Signed   By: Keith Rake M.D.   On: 11/24/2020 19:49   [place] EKG SITE RITE  Result Date: 11/27/2020 If Site Rite image not attached, placement could not be confirmed due to current cardiac rhythm.   Orson Eva, DO  [facility]  If 7PM-7AM, please contact night-coverage [email or web address] Password [email or web address] 11/30/2020, 10:32 AM   LOS: 4 days

## 2020-11-30 NOTE — Progress Notes (Signed)
SLP Cancellation Note  Patient Details Name: Jacqueline Hansen MRN: [id number] DOB: [birth year]   Cancelled treatment:       Reason Eval/Treat Not Completed: Medical issues which prohibited therapy (Change in status, no longer appropriate for BSE per RN and MD. SLP will sign off. Reconsult if indicated.)  Thank you,  Genene Churn, [place]  [place] 11/30/2020, 1:11 PM

## 2020-11-30 NOTE — Progress Notes (Signed)
Family in room with patient and ALL are in agreement to transition patient to comfort care. Everyone expressed full understanding and agreeable that IV drips will be turned off and that no resuscitation will be done per comfort care.  Dr Tat made aware.

## 2020-11-30 NOTE — Progress Notes (Signed)
*  PRELIMINARY RESULTS* Echocardiogram 2D Echocardiogram has been performed.  Jacqueline Hansen 11/30/2020, 9:30 AM

## 2020-11-30 NOTE — Consult Note (Signed)
Consultation Note Date: 11/30/2020   Patient Name: Jacqueline Hansen  DOB: 07/09/1940  MRN: [id number]  Age / Sex: 80 y.o., female  PCP: [facility] Referring Physician: Orson Eva, MD  Reason for Consultation: Establishing goals of care and Psychosocial/spiritual support  HPI/Patient Profile: 80 y.o. female  with past medical history of anxiety/depression, OA, dCHF, asthma, recurrent bronchitis/pneumonia, COPD, gastric ulcer, iron deficiency anemia, seizure disorder, DM2, history of PE, obesity, admitted on 11/07/2020 with septic shock secondary to UTI.   Clinical Assessment and Goals of Care: I have reviewed medical records including EPIC notes, labs and imaging, received report from RN, assessed the patient.  Jacqueline Hansen is lying quietly in bed. She appears critically ill.  She does not respond verbally to my questions, but will occasionally nod her head.  I do not believe that she can make her basic needs known.  There is no family at bedside at this time.  Conference with attending and bedside nursing staff.    Attending reaches out to family for goals of care discussion.  The plan is for palliative team to wait approximately 1 hour to reach out to family.    PMT calls to daughter, Tempie Donning.  She and I speak briefly before she requests that I speak to her son, Randall Hiss.  Randall Hiss tells me that Jacqueline Hansen was hospitalized a few weeks ago, but left AMA.  He tells me that she was hospitalized a few weeks ago, but left AMA.  He shares that a week to a week and a half later she was readmitted at [facility] where she spent 8 days for pneumonia.  He states that he feels that she was discharged too early.    We discussed a brief life review of the patient.  Randall Hiss states that Jacqueline Hansen has 3 children, one is in prison, the other has mental issues.  Olin Hauser is her main support.    We then focused on their  current illness.  We talked about sepsis, the treatment plan including IV antibiotics and fluids, we talked about fluid overload and Lasix, we reviewed chest x-ray from this morning and selected labs.  I share my worry that Jacqueline Hansen may not survive.  The natural disease trajectory and expectations at EOL were discussed.  Advanced directives, concepts specific to code status, were considered and discussed.  Randall Hiss tells me that they are experienced with end-of-life as he has "dealt with" this in the past with his other grandmother and great-grandmother.  We talk in detail about the benefits of "treat the treatable, but allowing natural passing".  Eric asked that we give him time to speak with his family.  Discussed the importance of continued conversation with family and the medical providers regarding overall plan of care and treatment options, ensuring decisions are within the context of the patient's values and GOCs. Questions and concerns were addressed.  The family was encouraged to call with questions or concerns.  PMT will continue to support holistically.  Conference with attending, bedside  nursing staff, transition of care team related to patient condition, needs, goals of care.    HCPOA   NEXT OF KIN - daughter Tempie Donning.  Jacqueline Hansen has 2 other sons, one in prison and another who has mental issues.     SUMMARY OF RECOMMENDATIONS   Continue Full scope/code Time for outcomes.    Code Status/Advance Care Planning: Full code  Symptom Management:  Per hospitalist, no additional needs at this time.    Palliative Prophylaxis:  Frequent Pain Assessment, Palliative Wound Care, and Turn Reposition  Additional Recommendations (Limitations, Scope, Preferences): Full Scope Treatment  Psycho-social/Spiritual:  Desire for further Chaplaincy support:no Additional Recommendations: Caregiving  Support/Resources and Grief/Bereavement Support  Prognosis:  Unable to determine, guarded  at this point, in hospital death anticipated   Discharge Planning:  to be determined, based on outcomes. Anticipate in hospital death.         Primary Diagnoses: Present on Admission:  UTI (urinary tract infection)  Sepsis due to undetermined organism ([place])  Leukocytosis  AKI (acute kidney injury) ([place])  COPD (chronic obstructive pulmonary disease) (HCC)  GERD (gastroesophageal reflux disease)   I have reviewed the medical record, interviewed the patient and family, and examined the patient. The following aspects are pertinent.  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[place])    "last one was in the 1970's" (09/08/2014)   History of blood transfusion ?2015   "cause I was throwing it up"   History of hiatal hernia    Hypercholesterolemia    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HCC)    On home oxygen therapy    "2L ususally at night time" (09/08/2014)   Pulmonary embolism ([place])    "when I had my gallbladder taken out"   Type II diabetes mellitus ([place])    Social History   Socioeconomic History   Marital status: Divorced    Spouse name: Not on file   Number of children: Not on file   Years of education: Not on file   Highest education level: Not on file  Occupational History   Not on file  Tobacco Use   Smoking status: Every Day    Packs/day: 1.00    Years: 32.00    Pack years: 32.00    Types: Cigarettes   Smokeless tobacco: Never  Vaping Use   Vaping Use: Not on file  Substance and Sexual Activity   Alcohol use: Not Currently    Comment: 09/08/2014 "I drank a little bit a long time ago"   Drug use: No   Sexual activity: Not Currently  Other Topics Concern   Not on file  Social  History Narrative   Not on file   Social Determinants of Health   Financial Resource Strain: Not on file  Food Insecurity: Not on file  Transportation Needs: Not on file  Physical Activity: Not on file  Stress: Not on file  Social Connections: Not on file   Family History  Problem Relation Age of Onset   Heart disease Mother    AAA (abdominal aortic aneurysm) Mother  Died of aneurysm   Diabetes Sister    Lung cancer Sister    Scheduled Meds:  (feeding supplement) PROSource Plus  30 mL Oral TID BM   Chlorhexidine Gluconate Cloth  6 each Topical Q0600   feeding supplement  237 mL Oral TID BM   folic acid  1 mg Oral Daily   heparin injection (subcutaneous)  5,000 Units Subcutaneous Q8H   mouth rinse  15 mL Mouth Rinse BID   mupirocin ointment   Nasal BID   nicotine  21 mg Transdermal Daily   pantoprazole (PROTONIX) IV  40 mg Intravenous Q24H   rOPINIRole  1 mg Oral QHS   sodium chloride flush  10-40 mL Intracatheter Q12H   vitamin B-12  500 mcg Oral Daily   Continuous Infusions:  ceFEPime (MAXIPIME) IV 2 g (11/30/20 0606)   phenylephrine (NEO-SYNEPHRINE) Adult infusion 160 mcg/min (11/30/20 0959)   vancomycin 1,000 mg (11/29/20 0635)   PRN Meds:.acetaminophen, HYDROcodone-acetaminophen, ipratropium-albuterol, senna-docusate, sodium chloride flush Medications Prior to Admission:  Prior to Admission medications   Medication Sig Start Date End Date Taking? Authorizing Provider  albuterol (PROVENTIL HFA;VENTOLIN HFA) 108 (90 Base) MCG/ACT inhaler Inhale 1-2 puffs into the lungs every 6 (six) hours as needed for wheezing or shortness of breath.   Yes [provider]  albuterol (PROVENTIL) (2.5 MG/3ML) 0.083% nebulizer solution Take 2.5 mg by nebulization every 6 (six) hours as needed for wheezing or shortness of breath.   Yes [provider]  ALPRAZolam Duanne Moron) 0.5 MG tablet Take 0.5 mg by mouth 2 (two) times daily.   Yes [provider]  bismuth  subsalicylate (PEPTO BISMOL) 262 MG/15ML suspension Take 30 mLs by mouth every 6 (six) hours as needed for indigestion or diarrhea or loose stools.   Yes [provider]  DULoxetine (CYMBALTA) 30 MG capsule Take 60 mg by mouth daily.    Yes [provider]  fluticasone (FLONASE) 50 MCG/ACT nasal spray Place 2 sprays into both nostrils daily as needed for allergies.    Yes [provider]  Fluticasone-Salmeterol (ADVAIR) 500-50 MCG/DOSE AEPB Inhale 1 puff into the lungs daily as needed (sob).   Yes [provider]  HYDROcodone-acetaminophen (NORCO/VICODIN) 5-325 MG tablet Take 1 tablet by mouth every 6 (six) hours as needed for moderate pain. Patient taking differently: Take 1 tablet by mouth in the morning, at noon, and at bedtime. 08/26/16  Yes Arrien, Jimmy Picket, MD  insulin glargine (LANTUS) 100 UNIT/ML injection Inject 0.2 mLs (20 Units total) into the skin at bedtime. Patient taking differently: Inject 10 Units into the skin at bedtime. 08/11/16  Yes Orvan Falconer, MD  ketoconazole (NIZORAL) 2 % cream Apply 1 application topically daily as needed for irritation (APPLIED TO FEET).  07/27/16  Yes [provider]  loperamide (IMODIUM A-D) 2 MG tablet Take 2 mg by mouth 4 (four) times daily as needed for diarrhea or loose stools.   Yes [provider]  Meclizine HCl 25 MG CHEW Chew 1 tablet by mouth 3 (three) times daily as needed (dizziness).   Yes [provider]  mirtazapine (REMERON) 30 MG tablet Take 30 mg by mouth at bedtime. 11/16/20  Yes [provider]  nystatin (MYCOSTATIN/NYSTOP) powder Apply topically. 11/12/20  Yes [provider]  ondansetron (ZOFRAN) 4 MG tablet TAKE 1 TABLET BY MOUTH EVERY 8 HOURS AS NEEDED FOR NAUSEA & VOMITING. 11/25/19  Yes Laurine Blazer B, PA-C  pantoprazole (PROTONIX) 40 MG tablet TAKE 1 TABLET ONCE DAILY.  09/14/20  Yes Harvel Quale, MD  potassium chloride (MICRO-K) 10 MEQ CR  capsule Take 10 mEq by mouth daily.  07/31/19  Yes [provider]  rOPINIRole (REQUIP) 1 MG tablet Take 2 mg by mouth at bedtime. 08/02/19  Yes [provider]  Vitamin D, Ergocalciferol, (DRISDOL) 1.25 MG (50000 UNIT) CAPS capsule Take 50,000 Units by mouth every 7 (seven) days. Saturdays   Yes [provider]  amLODipine (NORVASC) 5 MG tablet Take 5 mg by mouth daily.  Patient not taking: No sig reported 06/21/19   [provider]  apixaban (ELIQUIS) 5 MG TABS tablet Take 5 mg by mouth 2 (two) times daily. Patient not taking: No sig reported    [provider]  DULoxetine (CYMBALTA) 30 MG capsule Take by mouth. Patient not taking: No sig reported    [provider]  fluticasone (FLONASE) 50 MCG/ACT nasal spray 1 spray into each nostril daily. Patient not taking: No sig reported    [provider]  furosemide (LASIX) 40 MG tablet Take 40 mg by mouth 2 (two) times daily.  Patient not taking: No sig reported 08/15/19   [provider]  HUMALOG KWIKPEN 100 UNIT/ML KwikPen Inject into the skin. Patient not taking: No sig reported 11/11/20   [provider]  insulin aspart (NOVOLOG) 100 UNIT/ML injection Inject 0-10 Units into the skin 3 (three) times daily with meals. For glucose 150 to 200 use 2 units, 201 to 250 use 4 units, 251-300 use 6 units, 301-350 use 8 units, 351 or greater 10 units. Patient not taking: No sig reported 08/26/16   Arrien, Jimmy Picket, MD  metoCLOPramide (REGLAN) 5 MG tablet TAKE 1 TABLET 4 TIMES A DAY, BEFORE MEALS AND AT BEDTIME. Patient not taking: No sig reported 08/30/19   Laurine Blazer B, PA-C  ondansetron (ZOFRAN-ODT) 4 MG disintegrating tablet Take by mouth. Patient not taking: Reported on 11/28/2020    [provider]  pantoprazole (PROTONIX) 40 MG tablet Take by mouth. Patient not taking: Reported on 11/28/2020    [provider]   Allergies  Allergen Reactions    Morphine And Related     Hallucinations   Adhesive [Tape] Other (See Comments)    Reaction:  Tears pts skin    Review of Systems  Unable to perform ROS: Acuity of condition   Physical Exam Vitals and nursing note reviewed.  Constitutional:      General: She is not in acute distress.    Appearance: She is ill-appearing.  HENT:     Mouth/Throat:     Mouth: Mucous membranes are dry.  Cardiovascular:     Rate and Rhythm: Normal rate.  Pulmonary:     Effort: Respiratory distress present.  Skin:    General: Skin is warm.     Comments: Weeping all over   Neurological:     Comments: Unable to speak    Vital Signs: BP (!) 102/59   Pulse (!) 107   Temp (!) 96.5 F (35.8 C) (Axillary)   Resp (!) (P) 22   Ht 5' (1.524 m)   Wt 69.2 kg   SpO2 96%   BMI 29.79 kg/m  Pain Scale: CPOT POSS *See Group Information*: 2-Acceptable,Slightly drowsy, easily aroused Pain Score: 2    SpO2: SpO2: 96 % O2 Device:SpO2: 96 % O2 Flow Rate: .O2 Flow Rate (L/min): 2 L/min  IO: Intake/output summary:  Intake/Output Summary (Last 24 hours) at 11/30/2020 1026 Last data filed at 11/30/2020 0845  Gross per 24 hour  Intake 3235.97 ml  Output 800 ml  Net 2435.97 ml    LBM: Last BM Date: (P) 11/29/20 Baseline Weight: Weight: 69.2 kg Most recent weight: Weight: 69.2 kg     Palliative Assessment/Data:   Flowsheet Rows    Flowsheet Row Most Recent Value  Intake Tab   Referral Department Hospitalist  Unit at Time of Referral ICU  Palliative Care Primary Diagnosis Sepsis/Infectious Disease  Date Notified 11/27/20  Palliative Care Type New Palliative care  Reason for referral Clarify Goals of Care  Date of Admission 11/29/2020  Date first seen by Palliative Care 11/30/20  # of days Palliative referral response time 3 Day(s)  # of days IP prior to Palliative referral 1  Clinical Assessment   Palliative Performance Scale Score 20%  Pain Max last 24 hours Not able to report  Pain Min Last 24  hours Not able to report  Dyspnea Max Last 24 Hours Not able to report  Dyspnea Min Last 24 hours Not able to report  Psychosocial & Spiritual Assessment   Palliative Care Outcomes        Time In: 0950 Time Out: 1100 Time Total: 70 minutes  Greater than 50%  of this time was spent counseling and coordinating care related to the above assessment and plan.  Signed by: Drue Novel, NP   Please contact Palliative Medicine Team phone at [phone] for questions and concerns.  For individual provider: See Shea Evans

## 2020-11-30 NOTE — Progress Notes (Signed)
Patient alert with eyes open, only mouthing single words at times or nodding yes/no at times to questions. Patient weeping all over, buttocks/sacrum is red, weeping and lightly bleeding. Patient gently washed and turned. Blood pressures running soft and Neo being increased and of patient having some purple coloring to toes/feet and knees. Dr Tat made aware and in to assess and orders placed and given.

## 2020-11-30 NOTE — Progress Notes (Signed)
Met with daughter, grandson and granddaughter at bedside -updated family on patient's medical condition -discussed patient's overall poor prognosis in light of her repeated hospitalizations, frailty and multiple co-morbid conditions -disussed that patient continues to decline clinically despite "maximal" therapy -Family expressed understanding and stated they did not want the patient to suffer. -They wanted to transition patient's focus of care to focus soley on confort. -telemonitors will be turned off and plan to start morphine -medications with curative intent will be discontinued  DTat

## 2020-11-30 NOTE — Progress Notes (Signed)
Pharmacy Antibiotic Note  Jacqueline Hansen is a 80 y.o. female admitted on 11/24/2020 with sepsis.  Pharmacy has been consulted for vanc and cefepime dosing.   Plan: Vancomycin 1000 mg IV every 48 hours. Cefepime 2g IV every 12 hours. Monitor labs, c/s, and vanco level as indicated.  Height: 5' (152.4 cm) Weight: 69.2 kg (152 lb 8.9 oz) IBW/kg (Calculated) : 45.5  Temp (24hrs), Avg:96.6 F (35.9 C), Min:96.3 F (35.7 C), Max:96.9 F (36.1 C)  Recent Labs  Lab 11/30/2020 1741 11/24/2020 1929 11/25/2020 1947 11/27/20 0502 11/27/20 0803 11/27/20 1437 11/27/20 1500 11/28/20 0412 11/28/20 0940 11/29/20 0420 11/30/20 0351  WBC 44.3*  --   --  61.8*  --   --   --  30.3* 31.0* 26.0* 38.5*  CREATININE 1.53*  --    < > 1.41*  --   --  1.25* 1.09*  --  0.67 0.76  LATICACIDVEN 3.6* 3.8*  --  4.3* 3.7* 1.0  --   --   --   --   --    < > = values in this interval not displayed.     Estimated Creatinine Clearance: 49.5 mL/min (by C-G formula based on SCr of 0.76 mg/dL).    Allergies  Allergen Reactions   Morphine And Related     Hallucinations   Adhesive [Tape] Other (See Comments)    Reaction:  Tears pts skin     Antimicrobials this admission: 9/22 ceftriaxone/azith x1 9/23 vanc>> 9/23 cefepime>>  Dose adjustments this admission:   Microbiology results: 9/22 blood>>ngtd MRSA+ C.diff neg 9/22 urine>> multiple species  Margot Ables, PharmD Clinical Pharmacist 11/30/2020 11:14 AM

## 2020-12-01 LAB — CULTURE, BLOOD (ROUTINE X 2)
Culture: NO GROWTH
Culture: NO GROWTH
Special Requests: ADEQUATE

## 2020-12-05 LAB — CULTURE, BLOOD (ROUTINE X 2)
Culture: NO GROWTH
Culture: NO GROWTH
Special Requests: ADEQUATE

## 2020-12-05 NOTE — Plan of Care (Signed)
Care plan complete.   Problem: Education: Goal: Knowledge of General Education information will improve Description: Including pain rating scale, medication(s)/side effects and non-pharmacologic comfort measures 2020-12-16 0355 by Jesse Sans, RN Outcome: Adequate for Discharge 11/30/2020 2017 by Jesse Sans, RN Outcome: Not Progressing   Problem: Health Behavior/Discharge Planning: Goal: Ability to manage health-related needs will improve Dec 16, 2020 0355 by Jesse Sans, RN Outcome: Adequate for Discharge 11/30/2020 2017 by Jesse Sans, RN Outcome: Not Progressing   Problem: Clinical Measurements: Goal: Ability to maintain clinical measurements within normal limits will improve 2020/12/16 0355 by Jesse Sans, RN Outcome: Adequate for Discharge 11/30/2020 2017 by Jesse Sans, RN Outcome: Not Progressing Goal: Will remain free from infection December 16, 2020 0355 by Jesse Sans, RN Outcome: Adequate for Discharge 11/30/2020 2017 by Jesse Sans, RN Outcome: Not Progressing Goal: Diagnostic test results will improve 2020-12-16 0355 by Jesse Sans, RN Outcome: Adequate for Discharge 11/30/2020 2017 by Jesse Sans, RN Outcome: Not Progressing Goal: Respiratory complications will improve 16-Dec-2020 0355 by Jesse Sans, RN Outcome: Adequate for Discharge 11/30/2020 2017 by Jesse Sans, RN Outcome: Not Progressing Goal: Cardiovascular complication will be avoided 2020-12-16 0355 by Jesse Sans, RN Outcome: Adequate for Discharge 11/30/2020 2017 by Jesse Sans, RN Outcome: Not Progressing   Problem: Activity: Goal: Risk for activity intolerance will decrease December 16, 2020 0355 by Jesse Sans, RN Outcome: Adequate for Discharge 11/30/2020 2017 by Jesse Sans, RN Outcome: Not Progressing   Problem: Nutrition: Goal: Adequate nutrition will be maintained December 16, 2020 0355 by Jesse Sans, RN Outcome: Adequate for Discharge 11/30/2020  2017 by Jesse Sans, RN Outcome: Not Progressing   Problem: Coping: Goal: Level of anxiety will decrease 2020-12-16 0355 by Jesse Sans, RN Outcome: Adequate for Discharge 11/30/2020 2017 by Jesse Sans, RN Outcome: Not Progressing   Problem: Elimination: Goal: Will not experience complications related to bowel motility 12-16-2020 0355 by Jesse Sans, RN Outcome: Adequate for Discharge 11/30/2020 2017 by Jesse Sans, RN Outcome: Not Progressing Goal: Will not experience complications related to urinary retention 12/16/2020 0355 by Jesse Sans, RN Outcome: Adequate for Discharge 11/30/2020 2017 by Jesse Sans, RN Outcome: Not Progressing   Problem: Pain Managment: Goal: General experience of comfort will improve 12-16-2020 0355 by Jesse Sans, RN Outcome: Adequate for Discharge 11/30/2020 2017 by Jesse Sans, RN Outcome: Not Progressing   Problem: Safety: Goal: Ability to remain free from injury will improve 12/16/20 0355 by Jesse Sans, RN Outcome: Adequate for Discharge 11/30/2020 2017 by Jesse Sans, RN Outcome: Not Progressing   Problem: Skin Integrity: Goal: Risk for impaired skin integrity will decrease 16-Dec-2020 0355 by Jesse Sans, RN Outcome: Adequate for Discharge 11/30/2020 2017 by Jesse Sans, RN Outcome: Not Progressing

## 2020-12-05 NOTE — Death Summary Note (Signed)
DEATH SUMMARY   Patient Details  Name: Jacqueline Hansen MRN: [id number] DOB: [birth year]  Admission/Discharge Information   Admit Date:  12/13/20  Date of Death: Date of Death: 12-18-20  Time of Death: Time of Death: 0242  Length of Stay: 5  Referring Physician: [facility]   Reason(s) for Hospitalization  sepsis  Diagnoses  Preliminary cause of death: septic shock Secondary Diagnoses (including complications and co-morbidities):  Septic shock secondary to urinary tract infection - Leukocytosis, tachycardia, hypotension.   -PICC line placed 9/23.   -due to pneumonia and UTI -Change LR to normal saline due to lactic acidosis.   -blood culture remain neg -IV vancomycin and cefepime.  Albumin IV for 6 doses.   -Check procalcitonin, BNP, TSH.  Accu-Cheks every 4 hours.  Continue Levophed to maintain MAP greater than 65 COVID/flu- negative -She does have superficial wounds in her lower back and her mid back but does not appear to be infected. 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  CT head not performed on admission but currently unstable to move the patient.  Grossly she is following all the commands and moving all 4 extremities except her left lower extremity.   -Continue to monitor her mental status, will obtain further imaging if necessary.   -secondary to underlying severe infection. -9/26 mental status worsening--more somnolent;   Severe iron deficiency anemia History of large gastric ulcer - IV iron ordered.   -Hemoccult is negative.  Had C-scope and endoscopy about 4 weeks ago which was negative at an  outside hospital.   -IV PPI daily -supplemented B!2 and folate   Acute kidney injury - baseline creatinine 0.6-08 -serum creatinine peaked 1.53 -due to sepsis and volume depletion -improved with IVF intially>>saline lock due to fluid overload   Acute Respiratory distress with Hypoxia, b/l Pleural Effusion Recently had PNA? But also has pleural effusion. Cont on going Abx, Difficult diurese given hypotension.   -initially on 5L [place]>>weaned to 2L -9/23 CT abd--new consolidation bilateral LL -PCT 4.17>>5.41 -saline lock IVF -lasix IV x 1 on 9/25 -11/30/20 CXR--personally reviewed--increased interstitial markings; bilateral pleural effusions -9/26 now requires 5L  again   Hyperkalemia>>Hypokalemia - Treated in the ED with medication -now hypokalemic again>>replete   Hypocalcemia - give additional 1 dose of IV calcium gluconate.   Hypomagnesemia - Repletion ordered.     Severe leukocytosis - WBC is as high as 61 previously it was normal in 7 range.   -no diarrhea -no concerning WBC precursors -likely leukemoid reaction   History of previous CVA - Has chronic left-sided weakness especially lower extremities.   Per record she has a history of paroxysmal A. fib and PE - Recently she was taken off her anticoagulation.  We will monitor her off of that for now.   Restless leg syndrome - On ropinirole if she can tolerate orals   Goals of Care -updated daughter and grandson 9/26 early morning -Advance care planning, including the explanation and discussion of advance directives was carried out with the patient and family.  Code status including explanations of "Full Code" and "DNR" and alternatives were discussed in detail.  Discussion of end-of-life issues including but not limited palliative care, hospice  care and the concept of hospice, other end-of-life care options, power of attorney for health care decisions, living wills, and physician orders for life-sustaining treatment  were also discussed with the patient and family.  Total face to face time 16 minutes. -they wish to continue full scope of care initially -However, the patient continued to decline clinically despite full scope of care and medical therapy -I encourage the patient's family to come to the hospital   [place] [facility] Course (including significant findings, care, treatment, and services provided and events leading to death)  Jacqueline Hansen is a 80 y.o. year old  with history of anxiety, depression, osteoarthritis, diastolic CHF, asthma, recurrent episode of bronchitis and pneumonia, COPD, gastric ulcer, iron deficiency anemia, seizure disorder, DM2, history of PE, hyperlipidemia brought to the hospital for generalized weakness and low blood pressure.  Patient is a very poor historian.  She was admitted with diagnosis of septic shock secondary to urinary tract infection.  She was started on pressors, IV fluids.  Although there was some initial stabilization for the next 24-48 hours, the patient declined clinically in early morning 11/30/20 with dropping BP and increasing vasopressor demand.  Family was updated daily on patient's condition and goals of care were discussed.  They wished to continue full scope of care initially.  I update the patient's family regarding the patient's critical and declining medical condition and encouraged the family to come to the hospital.  Upon arrival, goals of care were discussed with daughter, grandson, granddaughter and other family at the bedside. -updated family on patient's medical condition at bedside 11/30/20 -discussed patient's overall poor prognosis in light of her repeated hospitalizations, frailty and multiple co-morbid conditions -disussed that patient continues to decline clinically despite "maximal" therapy/full scope of care -Family expressed understanding and stated they did not want the patient to suffer. -They wanted to transition patient's focus of care to focus  soley on confort.  Pertinent Labs and Studies  Significant Diagnostic Studies CT ABDOMEN PELVIS WO CONTRAST  Result Date: 11/27/2020 CLINICAL DATA:  Sepsis. EXAM: CT ABDOMEN AND PELVIS WITHOUT CONTRAST TECHNIQUE: Multidetector CT imaging of the abdomen and pelvis was performed following the standard protocol without IV contrast. COMPARISON:  CT abdomen pelvis dated 08/18/2018. FINDINGS: Evaluation of this exam is limited in the absence of intravenous contrast. Lower chest: Partially visualized small bilateral pleural effusions with near complete consolidative changes of the lower lobes which may represent atelectasis or infiltrate. There is coronary vascular calcification. No intra-abdominal free air or free fluid. Hepatobiliary: The liver is unremarkable. No intrahepatic biliary dilatation. Cholecystectomy. No retained calcified stone noted in the central CBD. Pancreas: The pancreas is atrophic.  No active inflammatory changes. Spleen: Normal in size without focal abnormality. Adrenals/Urinary Tract: The right adrenal gland is unremarkable. 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 There is no hydronephrosis or nephrolithiasis on either side. Interval increase in the size of the right renal interpolar exophytic low attenuating lesion now measuring 2.3 cm. This is suboptimally characterized but demonstrates fluid attenuation, likely a cyst. The visualized ureters and urinary bladder appear unremarkable. Stomach/Bowel: There is no bowel obstruction or active inflammation. 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 The appendix is not visualized with certainty. No inflammatory changes identified in the right lower quadrant. Vascular/Lymphatic: Advanced aortoiliac atherosclerotic disease. The IVC is unremarkable. No portal venous gas. There is no adenopathy. Reproductive: Hysterectomy.  No adnexal masses. Other: Small fat containing umbilical hernia. Musculoskeletal: Osteopenia with degenerative  changes  of the spine. No acute osseous pathology. IMPRESSION: 1. No acute intra-abdominal or pelvic pathology. No bowel obstruction. 2. Partially visualized small bilateral pleural effusions with near complete consolidative changes of the lower lobes which may represent atelectasis or infiltrate. 3. Left adrenal adenoma. 4. Aortic Atherosclerosis (ICD10-I70.0). Electronically Signed   By: Anner Crete M.D.   On: 11/27/2020 00:10   DG CHEST PORT 1 VIEW  Result Date: 11/30/2020 CLINICAL DATA:  Respiratory distress EXAM: PORTABLE CHEST 1 VIEW COMPARISON:  Portable exam 1034 hours compared to 11/27/2020 FINDINGS: RIGHT arm PICC line tip projects over SVC. Enlargement of cardiac silhouette with slight vascular congestion. Atherosclerotic calcification aorta. Bibasilar effusions and atelectasis greater on RIGHT with central peribronchial thickening again seen. Questionable RIGHT upper lobe infiltrate. No additional infiltrate or pneumothorax. Bones demineralized. IMPRESSION: Increased bibasilar effusions and atelectasis greater on RIGHT. Questionable RIGHT upper lobe infiltrate. Aortic Atherosclerosis (ICD10-I70.0). Electronically Signed   By: Lavonia Dana M.D.   On: 11/30/2020 10:50   DG Chest Port 1 View  Result Date: 11/27/2020 CLINICAL DATA:  Altered mental status. Dyspnea. History of congestive heart failure, asthma and COPD. EXAM: PORTABLE CHEST 1 VIEW COMPARISON:  Radiographs 11/17/2020 and 10/29/2020.  CT 09/26/2020. FINDINGS: 1423 hours. Right arm PICC projects to the level of the superior cavoatrial junction. The heart size and mediastinal contours are stable. There is aortic atherosclerosis. Small pleural effusions and mild bibasilar atelectasis are similar to the most recent prior study. There is no confluent airspace opacity, edema or pneumothorax. The bones appear unchanged. IMPRESSION: Similar appearance of the chest with small pleural effusions and mild bibasilar atelectasis. No new findings.  Electronically Signed   By: Richardean Sale M.D.   On: 11/27/2020 14:59   DG Chest Port 1 View  Result Date: 11/20/2020 CLINICAL DATA:  Questionable sepsis - evaluate for abnormality EXAM: PORTABLE CHEST 1 VIEW COMPARISON:  10/29/2020 FINDINGS: Right upper extremity PICC has been removed. Improved hazy lung base opacities with persistent bilateral pleural effusion. Fluid tracks into the right minor fissure. Associated bibasilar opacities favor atelectasis, overall improved from prior. Upper normal heart size. Aortic atherosclerosis. No pneumothorax. IMPRESSION: Improved hazy lung base opacities with persistent bilateral pleural effusions and atelectasis. Overall improved aeration from prior imaging. Electronically Signed   By: Keith Rake M.D.   On: 11/20/2020 19:49   ECHOCARDIOGRAM COMPLETE  Result Date: 11/30/2020    ECHOCARDIOGRAM REPORT   Patient Name:   GENESIS NOVOSAD Date of Exam: 11/30/2020 Medical Rec #:  [id number]   Height:       60.0 in Accession #:    [id number]  Weight:       152.6 lb Date of Birth:  24-Nov-1940    BSA:          1.664 m Patient Age:    80 years    BP:           100/70 mmHg Patient Gender: F           HR:           104 bpm. Exam Location:  [facility] Procedure: 2D Echo, Cardiac Doppler and Color Doppler Indications:    CHF-Acute Diastolic  History:        Patient has prior history of Echocardiogram examinations, most                 recent 09/11/2014. PAD and COPD; Risk Factors:Diabetes.  Sonographer:    Wenda Low Referring Phys: [phone] Nadie Fiumara IMPRESSIONS  1. Left ventricular  ejection fraction, by estimation, is 70 to 75%. The left ventricle has hyperdynamic function. The left ventricle has no regional wall motion abnormalities. Left ventricular diastolic parameters are consistent with Grade I diastolic dysfunction (impaired relaxation). There is the interventricular septum is flattened in systole and diastole, consistent with right ventricular pressure and volume overload.   2. Right ventricular systolic function is severely reduced. The right ventricular size is moderately enlarged. There is normal pulmonary artery systolic pressure. The estimated right ventricular systolic pressure is 70.9 mmHg.  3. Left atrial size was mildly dilated.  4. Right atrial size was mildly dilated.  5. The mitral valve is abnormal. Mild mitral valve regurgitation. The mean mitral valve gradient is 3.0 mmHg. Moderate mitral annular calcification.  6. The aortic valve is tricuspid. Aortic valve regurgitation is not visualized. Mild to moderate aortic valve sclerosis/calcification is present, without any evidence of aortic stenosis. Aortic valve mean gradient measures 5.0 mmHg.  7. The inferior vena cava is normal in size with <50% respiratory variability, suggesting right atrial pressure of 8 mmHg. Comparison(s): Prior images unable to be directly viewed. FINDINGS  Left Ventricle: Left ventricular ejection fraction, by estimation, is 70 to 75%. The left ventricle has hyperdynamic function. The left ventricle has no regional wall motion abnormalities. The left ventricular internal cavity size was normal in size. There is borderline left ventricular hypertrophy. The interventricular septum is flattened in systole and diastole, consistent with right ventricular pressure and volume overload. Left ventricular diastolic parameters are consistent with Grade I diastolic dysfunction (impaired relaxation). Right Ventricle: The right ventricular size is moderately enlarged. No increase in right ventricular wall thickness. Right ventricular systolic function is severely reduced. There is normal pulmonary artery systolic pressure. The tricuspid regurgitant velocity is 2.64 m/s, and with an assumed right atrial pressure of 8 mmHg, the estimated right ventricular systolic pressure is 62.8 mmHg. Left Atrium: Left atrial size was mildly dilated. Right Atrium: Right atrial size was mildly dilated. Pericardium: There is no  evidence of pericardial effusion. Presence of pericardial fat pad. Mitral Valve: The mitral valve is abnormal. Moderate mitral annular calcification. Mild mitral valve regurgitation. MV peak gradient, 6.7 mmHg. The mean mitral valve gradient is 3.0 mmHg. Tricuspid Valve: The tricuspid valve is grossly normal. Tricuspid valve regurgitation is mild. Aortic Valve: The aortic valve is tricuspid. There is mild aortic valve annular calcification. Aortic valve regurgitation is not visualized. Mild to moderate aortic valve sclerosis/calcification is present, without any evidence of aortic stenosis. Aortic  valve mean gradient measures 5.0 mmHg. Aortic valve peak gradient measures 9.9 mmHg. Aortic valve area, by VTI measures 1.71 cm. Pulmonic Valve: The pulmonic valve was grossly normal. Pulmonic valve regurgitation is trivial. Aorta: The aortic root is normal in size and structure. Venous: The inferior vena cava is normal in size with less than 50% respiratory variability, suggesting right atrial pressure of 8 mmHg. IAS/Shunts: No atrial level shunt detected by color flow Doppler.  LEFT VENTRICLE PLAX 2D LVIDd:         3.00 cm  Diastology LVIDs:         2.20 cm  LV e' medial:    7.62 cm/s LV PW:         1.10 cm  LV E/e' medial:  12.1 LV IVS:        0.90 cm  LV e' lateral:   6.42 cm/s LVOT diam:     1.80 cm  LV E/e' lateral: 14.3 LV SV:  50 LV SV Index:   30 LVOT Area:     2.54 cm  RIGHT VENTRICLE RV Basal diam:  4.05 cm RV Mid diam:    3.80 cm RV S prime:     9.57 cm/s TAPSE (M-mode): 1.3 cm LEFT ATRIUM             Index       RIGHT ATRIUM           Index LA diam:        2.70 cm 1.62 cm/m  RA Area:     18.40 cm LA Vol (A2C):   73.2 ml 44.00 ml/m RA Volume:   59.90 ml  36.00 ml/m LA Vol (A4C):   39.5 ml 23.74 ml/m LA Biplane Vol: 54.6 ml 32.82 ml/m  AORTIC VALVE                   PULMONIC VALVE AV Area (Vmax):    1.82 cm    PV Vmax:       0.61 m/s AV Area (Vmean):   1.60 cm    PV Peak grad:  1.5 mmHg AV Area  (VTI):     1.71 cm AV Vmax:           157.00 cm/s AV Vmean:          97.600 cm/s AV VTI:            0.291 m AV Peak Grad:      9.9 mmHg AV Mean Grad:      5.0 mmHg LVOT Vmax:         112.00 cm/s LVOT Vmean:        61.200 cm/s LVOT VTI:          0.195 m LVOT/AV VTI ratio: 0.67  AORTA Ao Root diam: 3.20 cm Ao Asc diam:  3.20 cm MITRAL VALVE                TRICUSPID VALVE MV Area (PHT): 4.12 cm     TR Peak grad:   27.9 mmHg MV Area VTI:   2.15 cm     TR Vmax:        264.00 cm/s MV Peak grad:  6.7 mmHg MV Mean grad:  3.0 mmHg     SHUNTS MV Vmax:       1.29 m/s     Systemic VTI:  0.20 m MV Vmean:      82.0 cm/s    Systemic Diam: 1.80 cm MV Decel Time: 184 msec MV E velocity: 92.10 cm/s MV A velocity: 127.00 cm/s MV E/A ratio:  0.73 Rozann Lesches MD Electronically signed by Rozann Lesches MD Signature Date/Time: 11/30/2020/10:56:22 AM    Final    [place] EKG SITE RITE  Result Date: 11/27/2020 If Site Rite image not attached, placement could not be confirmed due to current cardiac rhythm.   Microbiology Recent Results (from the past 240 hour(s))  Blood Culture (routine x 2)     Status: None   Collection Time: 12/03/2020  5:41 PM   Specimen: Right Antecubital; Blood  Result Value Ref Range Status   Specimen Description RIGHT ANTECUBITAL  Final   Special Requests   Final    Blood Culture results may not be optimal due to an excessive volume of blood received in culture bottles BOTTLES DRAWN AEROBIC AND ANAEROBIC   Culture   Final    NO GROWTH 5 DAYS Performed at [facility], [address], [place], [place] [address]  Report Status 2020/12/05 FINAL  Final  Blood Culture (routine x 2)     Status: None   Collection Time: 12/03/2020  5:50 PM   Specimen: Left Antecubital; Blood  Result Value Ref Range Status   Specimen Description LEFT ANTECUBITAL  Final   Special Requests   Final    Blood Culture adequate volume BOTTLES DRAWN AEROBIC AND ANAEROBIC   Culture   Final    NO GROWTH 5 DAYS Performed at  [facility], [address], [place], [place] [address]    Report Status 12-05-2020 FINAL  Final  Urine Culture     Status: Abnormal   Collection Time: 11/17/2020  8:10 PM   Specimen: In/Out Cath Urine  Result Value Ref Range Status   Specimen Description   Final    IN/OUT CATH URINE Performed at [facility], [address] [place], [place] [address]    Special Requests   Final    NONE Performed at [facility], [address] [place], [place] [address]    Culture MULTIPLE SPECIES PRESENT, SUGGEST RECOLLECTION (A)  Final   Report Status 11/28/2020 FINAL  Final  C Difficile Quick Screen w PCR reflex     Status: None   Collection Time: 11/27/20 12:34 AM   Specimen: STOOL  Result Value Ref Range Status   C Diff antigen NEGATIVE NEGATIVE Final   C Diff toxin NEGATIVE NEGATIVE Final   C Diff interpretation No C. difficile detected.  Final    Comment: NEGATIVE Performed at [facility], [address] [place], [place] [address]   MRSA Next Gen by PCR, Nasal     Status: Abnormal   Collection Time: 11/27/20 12:36 AM   Specimen: Nasal Mucosa; Nasal Swab  Result Value Ref Range Status   MRSA by PCR Next Gen DETECTED (A) NOT DETECTED Final    Comment: RESULT CALLED TO, READ BACK BY AND VERIFIED WITH: WAGNOR,[email or web address]@[id number]  BY MATTHEWS, B 9.23.22 (NOTE) The GeneXpert MRSA Assay (FDA approved for NASAL specimens only), is one component of a comprehensive MRSA colonization surveillance program. It is not intended to diagnose MRSA infection nor to guide or monitor treatment for MRSA infections. Test performance is not FDA approved in patients less than 52 years old. Performed at [facility], [address], [place], [place] [address]   Resp Panel by RT-PCR (Flu A&B, Covid) Nasopharyngeal Swab     Status: None   Collection Time: 11/27/20  2:06 AM   Specimen: Nasopharyngeal Swab; Nasopharyngeal(NP) swabs in vial transport medium  Result Value Ref Range Status   SARS Coronavirus 2 by RT PCR  NEGATIVE NEGATIVE Final    Comment: (NOTE) SARS-CoV-2 target nucleic acids are NOT DETECTED.  The SARS-CoV-2 RNA is generally detectable in upper respiratory specimens during the acute phase of infection. The lowest concentration of SARS-CoV-2 viral copies this assay can detect is 138 copies/mL. A negative result does not preclude SARS-Cov-2 infection and should not be used as the sole basis for treatment or other patient management decisions. A negative result may occur with  improper specimen collection/handling, submission of specimen other than nasopharyngeal swab, presence of viral mutation(s) within the areas targeted by this assay, and inadequate number of viral copies(<138 copies/mL). A negative result must be combined with clinical observations, patient history, and epidemiological information. The expected result is Negative.  Fact Sheet for Patients:  EntrepreneurPulse.com.au  Fact Sheet for Healthcare Providers:  IncredibleEmployment.be  This test is no t yet approved or cleared by the [place] FDA  and  has been authorized for detection and/or diagnosis of SARS-CoV-2 by FDA under an Emergency Use Authorization (EUA). This EUA will remain  in effect (meaning this test can be used) for the duration of the COVID-19 declaration under Section 564(b)(1) of the Act, 21 U.S.C.section 360bbb-3(b)(1), unless the authorization is terminated  or revoked sooner.       Influenza A by PCR NEGATIVE NEGATIVE Final   Influenza B by PCR NEGATIVE NEGATIVE Final    Comment: (NOTE) The Xpert Xpress SARS-CoV-2/FLU/RSV plus assay is intended as an aid in the diagnosis of influenza from Nasopharyngeal swab specimens and should not be used as a sole basis for treatment. Nasal washings and aspirates are unacceptable for Xpert Xpress SARS-CoV-2/FLU/RSV testing.  Fact Sheet for Patients: EntrepreneurPulse.com.au  Fact Sheet for  Healthcare Providers: IncredibleEmployment.be  This test is not yet approved or cleared by the [place] FDA and has been authorized for detection and/or diagnosis of SARS-CoV-2 by FDA under an Emergency Use Authorization (EUA). This EUA will remain in effect (meaning this test can be used) for the duration of the COVID-19 declaration under Section 564(b)(1) of the Act, 21 U.S.C. section 360bbb-3(b)(1), unless the authorization is terminated or revoked.  Performed at [facility], [address], [place], [place] [address]   Culture, blood (routine x 2)     Status: None (Preliminary result)   Collection Time: 11/30/20 12:11 PM   Specimen: BLOOD  Result Value Ref Range Status   Specimen Description BLOOD PICC LINE  Final   Special Requests   Final    BOTTLES DRAWN AEROBIC AND ANAEROBIC Blood Culture adequate volume   Culture   Final    NO GROWTH 2 DAYS Performed at [facility], [address], [place], [place] [address]    Report Status PENDING  Incomplete  Culture, blood (routine x 2)     Status: None (Preliminary result)   Collection Time: 11/30/20 12:11 PM   Specimen: BLOOD  Result Value Ref Range Status   Specimen Description BLOOD LEFT FINGER  Final   Special Requests   Final    AEROBIC BOTTLE ONLY Blood Culture results may not be optimal due to an inadequate volume of blood received in culture bottles   Culture   Final    NO GROWTH 2 DAYS Performed at [facility], [address], [place], [place] [address]    Report Status PENDING  Incomplete    Lab Basic Metabolic Panel: Recent Labs  Lab 11/27/20 0502 11/27/20 1500 11/28/20 0412 11/29/20 0420 11/30/20 0351 11/30/20 1211  NA 133* 131* 137 141 144 146*  K 5.0 4.0 2.8* 2.9* 3.6 3.5  CL 100 101 108 119* 122* 120*  CO2 23 22 20* 17* 17* 17*  GLUCOSE 90 282* 112* 212* 123* 148*  BUN 35* 33* 30* 20 19 20   CREATININE 1.41* 1.25* 1.09* 0.67 0.76 0.90  CALCIUM 7.8* 7.1* 7.3* 6.3* 7.2* 7.1*  MG  1.4*  --  1.7 1.9 1.8  --   PHOS 3.3  --   --   --   --   --    Liver Function Tests: Recent Labs  Lab 11/27/20 0502 11/28/20 0412 11/29/20 0420 11/30/20 0351 11/30/20 1211  AST 65* 24 19 21 18   ALT 30 18 17 21 19   ALKPHOS 140* 73 74 96 110  BILITOT 0.4 0.2* 0.7 0.9 0.7  PROT 4.7* 4.6* 3.9* 4.1* 4.0*  ALBUMIN 1.4* 3.0* 2.4* 2.2* 2.1*   No results for input(s):  LIPASE, AMYLASE in the last 168 hours. No results for input(s): AMMONIA in the last 168 hours. CBC: Recent Labs  Lab 11/27/2020 1741 11/27/20 0502 11/28/20 0412 11/28/20 0734 11/28/20 0940 11/29/20 0420 11/30/20 0351 11/30/20 1211  WBC 44.3*   < > 30.3*  --  31.0* 26.0* 38.5* 37.1*  NEUTROABS 41.0*  --   --   --   --   --  35.9*  --   HGB 10.6*   < > 6.3* 6.6* 6.8* 10.4* 11.6* 11.3*  HCT 37.9   < > 21.9* 23.0* 23.2* 33.6* 38.9 37.8  MCV 78.1*   < > 79.3*  --  79.2* 82.2 82.4 83.6  PLT 573*   < > 274  --  303 191 181 158   < > = values in this interval not displayed.   Cardiac Enzymes: No results for input(s): CKTOTAL, CKMB, CKMBINDEX, TROPONINI in the last 168 hours. Sepsis Labs: Recent Labs  Lab 11/16/2020 1929 11/27/20 0502 11/27/20 0803 11/27/20 1437 11/28/20 0412 11/28/20 0940 11/29/20 0420 11/30/20 0351 11/30/20 1211  PROCALCITON  --   --  4.17  --   --   --  5.41  --  3.28  WBC  --  61.8*  --   --    < > 31.0* 26.0* 38.5* 37.1*  LATICACIDVEN 3.8* 4.3* 3.7* 1.0  --   --   --   --   --    < > = values in this interval not displayed.    Procedures/Operations    Carmin Alvidrez 12/03/2020, 8:02 AM

## 2020-12-05 NOTE — Progress Notes (Signed)
Patient expired peacefully at 0242 hrs with her daughter and granddaughter by her side. Time of Death pronounced by Morton Peters, RN and myself (per MD order "RN may pronounce...). Dr. Josephine Cables notified. [facility] notified per prtocol (see flowsheet). All of the patient's belongings in the room were returned to the family.

## 2020-12-05 DEATH — deceased

## 2022-03-14 IMAGING — CT CT ABD-PELV W/O CM
2 of 4 series · 16 of 46 positions shown, 18 images · non-contrast
Comparison: CT abdomen pelvis dated 08/18/2018.

CLINICAL DATA: Sepsis.

EXAM:
CT ABDOMEN AND PELVIS WITHOUT CONTRAST
TECHNIQUE: Multidetector CT imaging of the abdomen and pelvis was performed
following the standard protocol without IV contrast.

[Series 3: axial st · axial · 0.71mm/px · z∈[+664,+1054]mm · 13 of 88 slices shown, 15 images]
[im 5/88  soft-tissue]
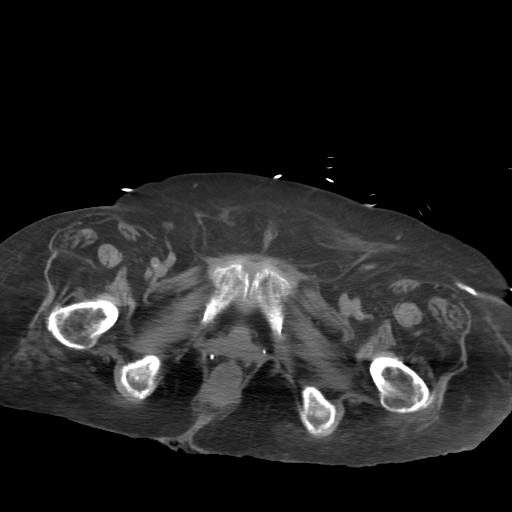
[im 5/88  bone]
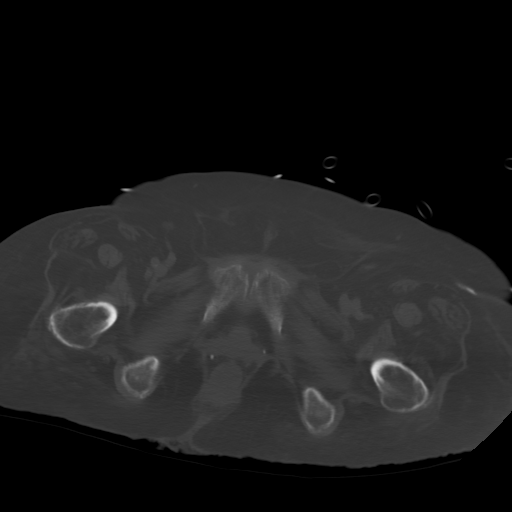
[im 14/88  soft-tissue]
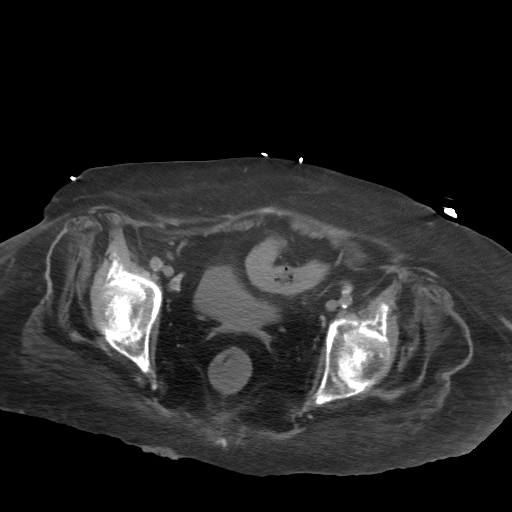
[im 19/88  soft-tissue]
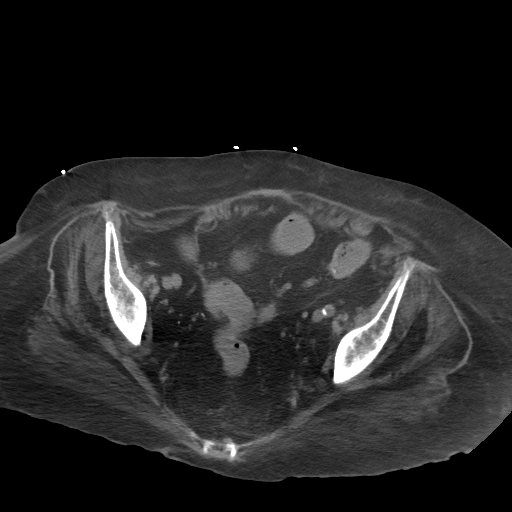
[im 23/88  soft-tissue]
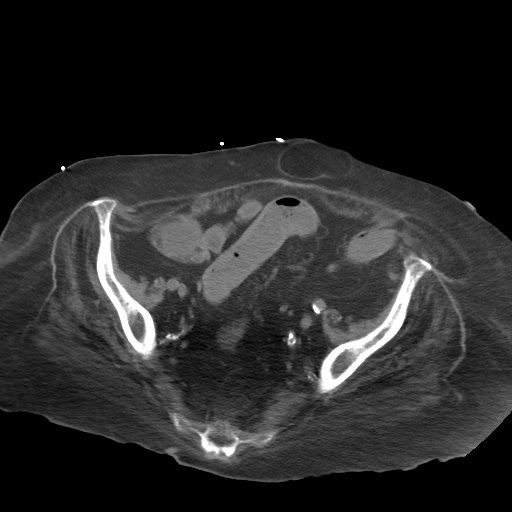
[im 33/88  soft-tissue]
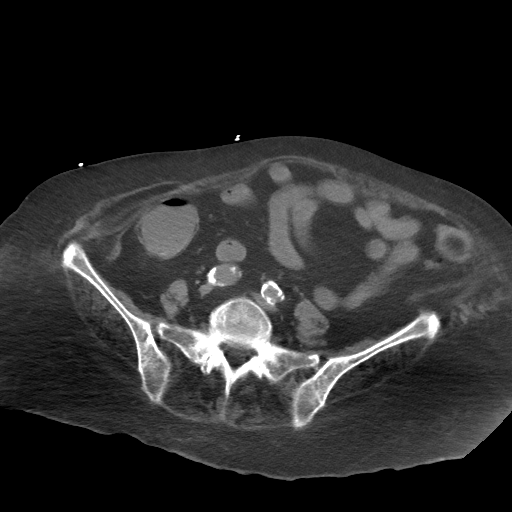
[im 37/88  soft-tissue]
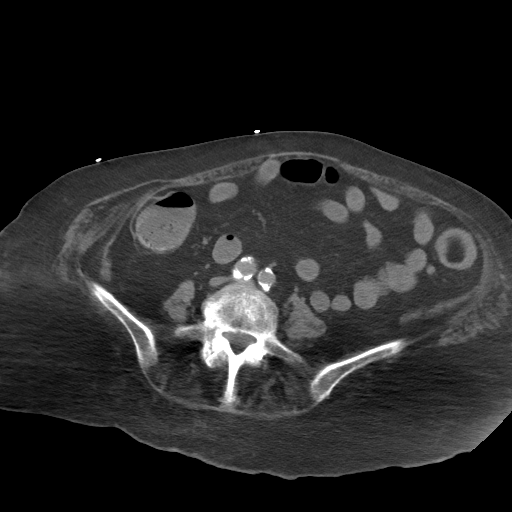
[im 46/88  soft-tissue]
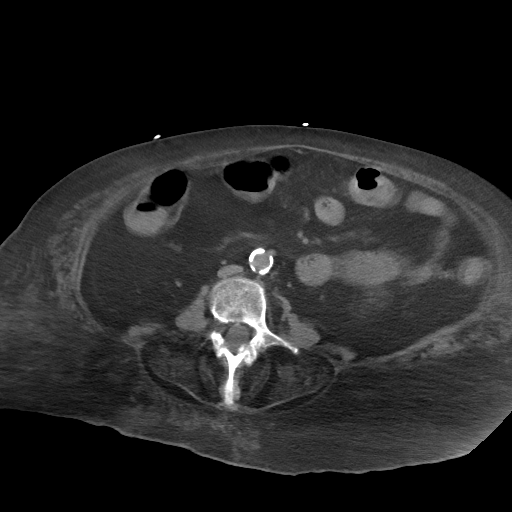
[im 51/88  soft-tissue]
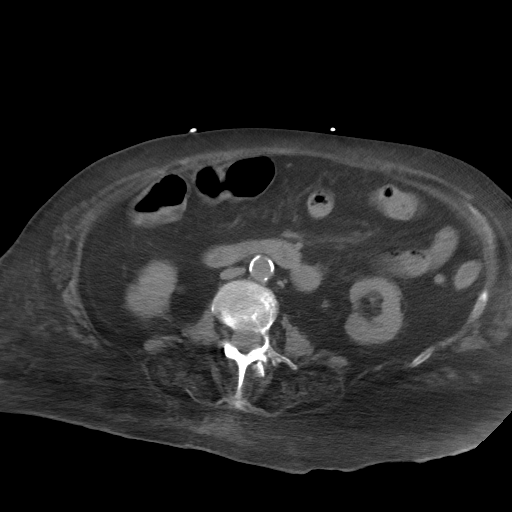
[im 55/88  soft-tissue]
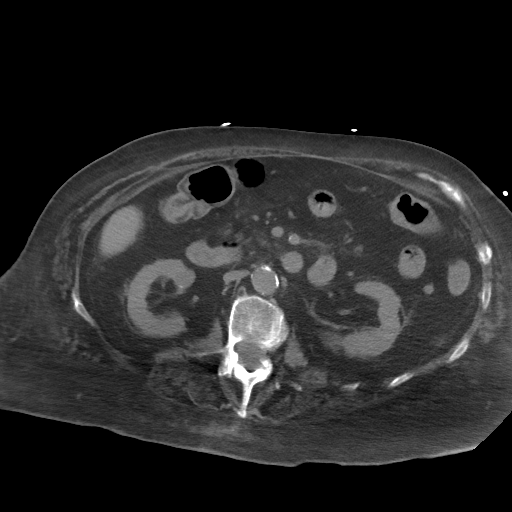
[im 55/88  bone]
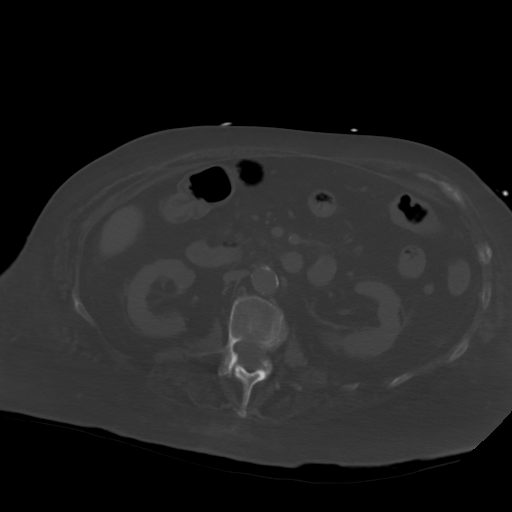
[im 65/88  soft-tissue]
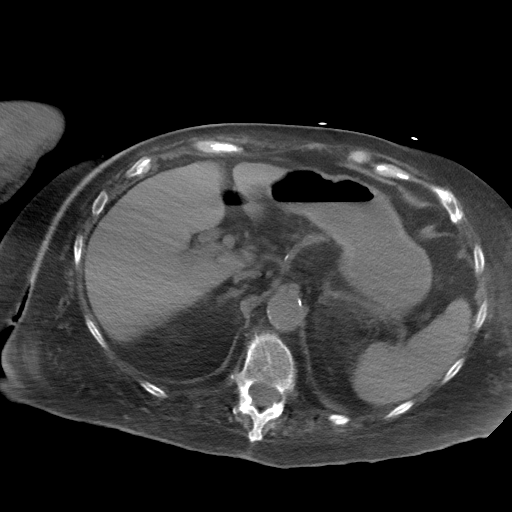
[im 69/88  soft-tissue]
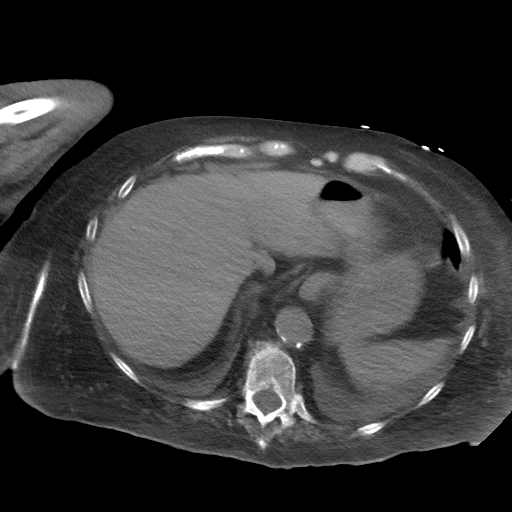
[im 74/88  soft-tissue]
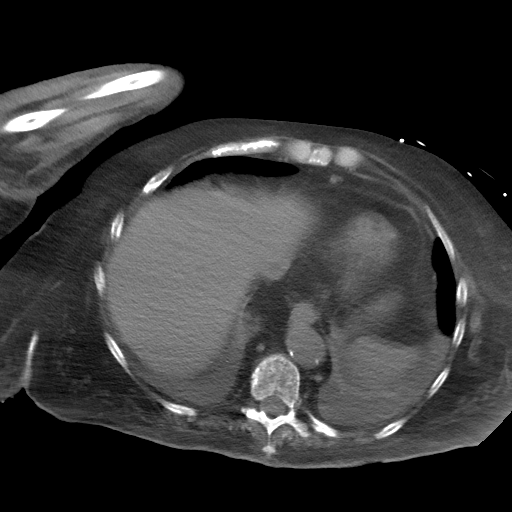
[im 83/88  soft-tissue]
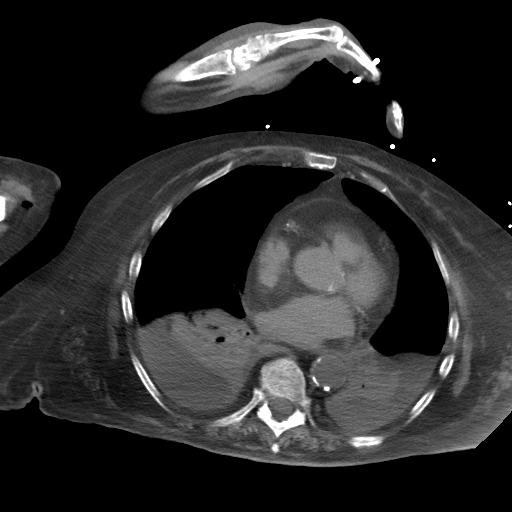

[Series 6: coronal st · coronal · 0.82mm/px · 3 of 77 slices shown]
[im 26/77  soft-tissue]
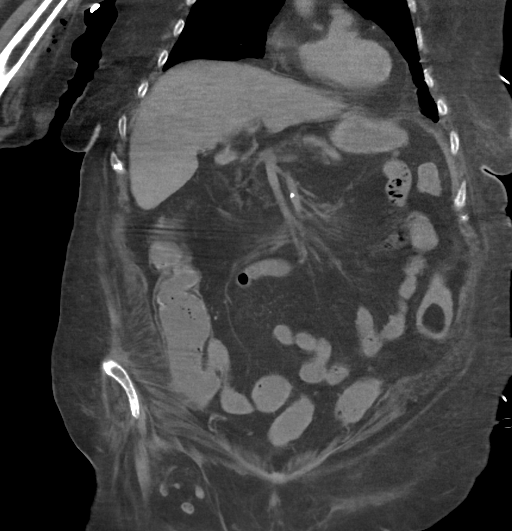
[im 34/77  soft-tissue]
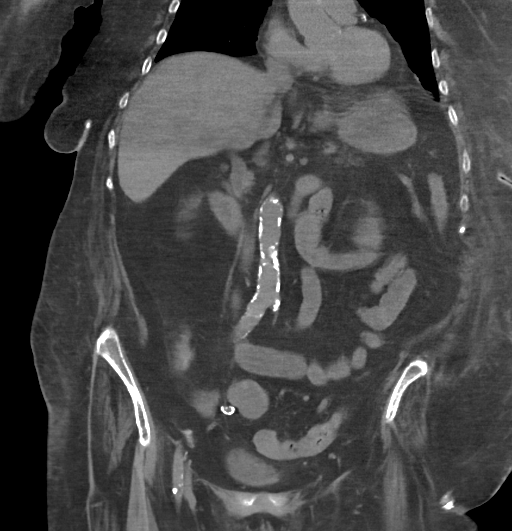
[im 43/77  soft-tissue]
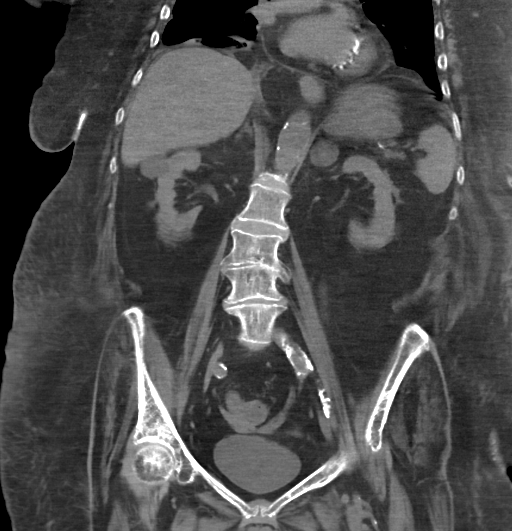

[16 of 46 positions shown; findings below may reference images not displayed]

FINDINGS: Evaluation of this exam is limited in the absence of intravenous
contrast.

Lower chest: Partially visualized small bilateral pleural effusions
with near complete consolidative changes of the lower lobes which
may represent atelectasis or infiltrate. There is coronary vascular
calcification.

No intra-abdominal free air or free fluid.

Hepatobiliary: The liver is unremarkable. No intrahepatic biliary
dilatation. Cholecystectomy. No retained calcified stone noted in
the central CBD.

Pancreas: The pancreas is atrophic.  No active inflammatory changes.

Spleen: Normal in size without focal abnormality.

Adrenals/Urinary Tract: The right adrenal gland is unremarkable.
There is a 2.2 cm left adrenal adenoma. There is no hydronephrosis
or nephrolithiasis on either side. Interval increase in the size of
the right renal interpolar exophytic low attenuating lesion now
measuring 2.3 cm. This is suboptimally characterized but
demonstrates fluid attenuation, likely a cyst. The visualized
ureters and urinary bladder appear unremarkable.

Stomach/Bowel: There is no bowel obstruction or active inflammation.
There is a 2.5 cm lipoma in the distal descending colon. The
appendix is not visualized with certainty. No inflammatory changes
identified in the right lower quadrant.

Vascular/Lymphatic: Advanced aortoiliac atherosclerotic disease. The
IVC is unremarkable. No portal venous gas. There is no adenopathy.

Reproductive: Hysterectomy.  No adnexal masses.

Other: Small fat containing umbilical hernia.

Musculoskeletal: Osteopenia with degenerative changes of the spine.
No acute osseous pathology.
IMPRESSION: 1. No acute intra-abdominal or pelvic pathology. No bowel
obstruction.
2. Partially visualized small bilateral pleural effusions with near
complete consolidative changes of the lower lobes which may
represent atelectasis or infiltrate.
3. Left adrenal adenoma.
4. Aortic Atherosclerosis ([id number]).

## 2022-03-18 IMAGING — DX DG CHEST 1V PORT
1 series · 1 of 1 positions shown · non-contrast
Comparison: Portable exam 2951 hours compared to 11/27/2020

CLINICAL DATA: Respiratory distress

EXAM:
PORTABLE CHEST 1 VIEW

[chest ap]
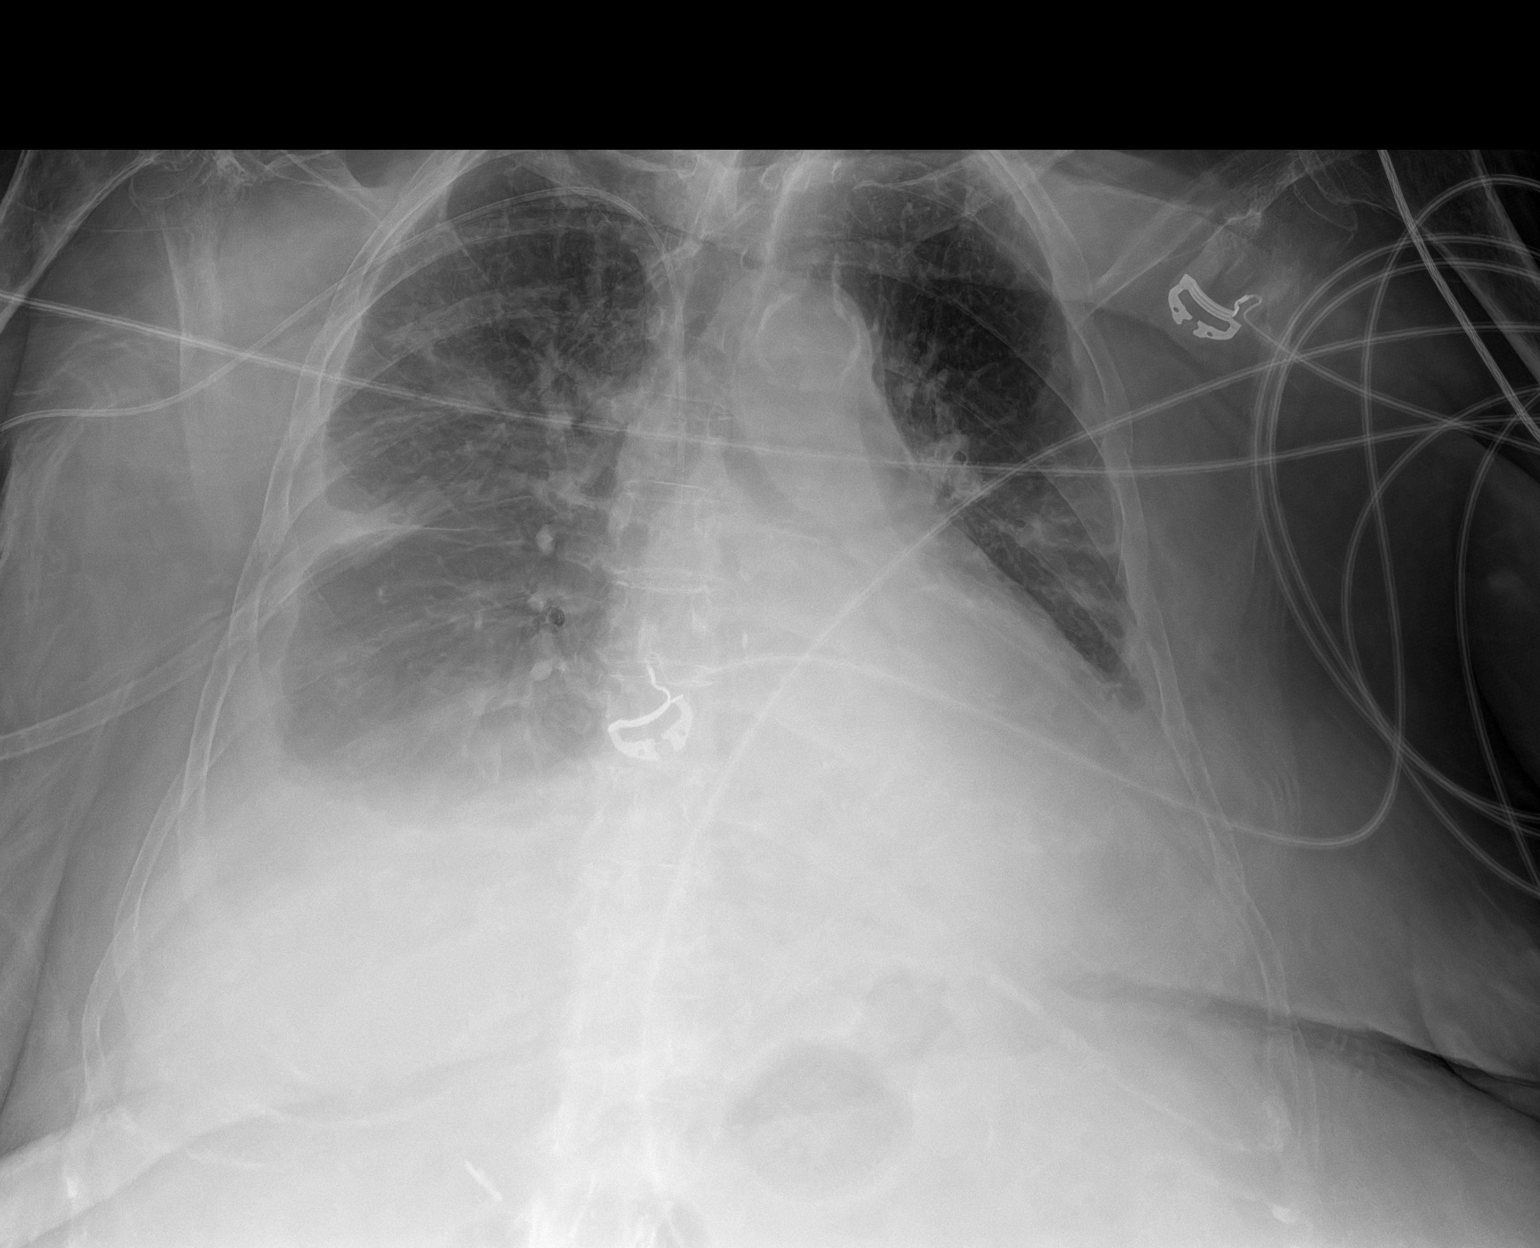

[1 of 1 positions shown; findings below may reference images not displayed]

FINDINGS: RIGHT arm PICC line tip projects over SVC.

Enlargement of cardiac silhouette with slight vascular congestion.

Atherosclerotic calcification aorta.

Bibasilar effusions and atelectasis greater on RIGHT with central
peribronchial thickening again seen.

Questionable RIGHT upper lobe infiltrate.

No additional infiltrate or pneumothorax.

Bones demineralized.
IMPRESSION: Increased bibasilar effusions and atelectasis greater on RIGHT.

Questionable RIGHT upper lobe infiltrate.

Aortic Atherosclerosis ([id number]).
